# Patient Record
Sex: Female | Born: 1935 | Race: White | Hispanic: No | Marital: Married | Smoking: Never smoker
Health system: Southern US, Community
[De-identification: ages and names within clinical notes are randomized; demographics above are authoritative.]

## PROBLEM LIST (undated history)

## (undated) DIAGNOSIS — M1990 Unspecified osteoarthritis, unspecified site: Secondary | ICD-10-CM

## (undated) DIAGNOSIS — M810 Age-related osteoporosis without current pathological fracture: Secondary | ICD-10-CM

## (undated) DIAGNOSIS — I10 Essential (primary) hypertension: Secondary | ICD-10-CM

## (undated) DIAGNOSIS — K219 Gastro-esophageal reflux disease without esophagitis: Secondary | ICD-10-CM

## (undated) DIAGNOSIS — E7800 Pure hypercholesterolemia, unspecified: Secondary | ICD-10-CM

## (undated) DIAGNOSIS — F0390 Unspecified dementia without behavioral disturbance: Secondary | ICD-10-CM

## (undated) DIAGNOSIS — H269 Unspecified cataract: Secondary | ICD-10-CM

## (undated) HISTORY — PX: TONSILLECTOMY: SUR1361

## (undated) HISTORY — PX: APPENDECTOMY: SHX54

## (undated) HISTORY — PX: EYE SURGERY: SHX253

## (undated) HISTORY — DX: Unspecified cataract: H26.9

## (undated) HISTORY — DX: Age-related osteoporosis without current pathological fracture: M81.0

## (undated) HISTORY — PX: JOINT REPLACEMENT: SHX530

## (undated) HISTORY — PX: COLONOSCOPY: SHX174

## (undated) HISTORY — PX: ABDOMINAL HYSTERECTOMY: SHX81

## (undated) HISTORY — PX: BACK SURGERY: SHX140

---

## 1999-11-12 ENCOUNTER — Encounter: Admission: RE | Admit: 1999-11-12 | Discharge: 1999-11-12 | Payer: Self-pay | Admitting: Obstetrics and Gynecology

## 1999-11-12 ENCOUNTER — Encounter: Payer: Self-pay | Admitting: Obstetrics and Gynecology

## 1999-12-20 ENCOUNTER — Other Ambulatory Visit: Admission: RE | Admit: 1999-12-20 | Discharge: 1999-12-20 | Payer: Self-pay | Admitting: Obstetrics and Gynecology

## 2000-11-17 ENCOUNTER — Encounter: Admission: RE | Admit: 2000-11-17 | Discharge: 2000-11-17 | Payer: Self-pay | Admitting: Obstetrics and Gynecology

## 2000-11-17 ENCOUNTER — Encounter: Payer: Self-pay | Admitting: Obstetrics and Gynecology

## 2001-01-12 ENCOUNTER — Other Ambulatory Visit: Admission: RE | Admit: 2001-01-12 | Discharge: 2001-01-12 | Payer: Self-pay | Admitting: Obstetrics and Gynecology

## 2001-06-02 ENCOUNTER — Encounter: Payer: Self-pay | Admitting: Obstetrics and Gynecology

## 2001-06-02 ENCOUNTER — Encounter: Admission: RE | Admit: 2001-06-02 | Discharge: 2001-06-02 | Payer: Self-pay | Admitting: Obstetrics and Gynecology

## 2001-11-19 ENCOUNTER — Encounter: Payer: Self-pay | Admitting: Family Medicine

## 2001-11-19 ENCOUNTER — Encounter: Admission: RE | Admit: 2001-11-19 | Discharge: 2001-11-19 | Payer: Self-pay | Admitting: Family Medicine

## 2002-07-06 ENCOUNTER — Inpatient Hospital Stay (HOSPITAL_COMMUNITY): Admission: EM | Admit: 2002-07-06 | Discharge: 2002-07-14 | Payer: Self-pay | Admitting: Emergency Medicine

## 2002-07-06 ENCOUNTER — Encounter: Payer: Self-pay | Admitting: Emergency Medicine

## 2002-07-06 ENCOUNTER — Encounter: Payer: Self-pay | Admitting: *Deleted

## 2002-08-02 ENCOUNTER — Encounter: Admission: RE | Admit: 2002-08-02 | Discharge: 2002-09-16 | Payer: Self-pay | Admitting: Orthopaedic Surgery

## 2002-10-07 ENCOUNTER — Ambulatory Visit (HOSPITAL_COMMUNITY): Admission: RE | Admit: 2002-10-07 | Discharge: 2002-10-07 | Payer: Self-pay | Admitting: Orthopaedic Surgery

## 2002-11-04 ENCOUNTER — Ambulatory Visit (HOSPITAL_COMMUNITY): Admission: RE | Admit: 2002-11-04 | Discharge: 2002-11-04 | Payer: Self-pay | Admitting: Orthopaedic Surgery

## 2002-11-21 ENCOUNTER — Encounter: Payer: Self-pay | Admitting: Family Medicine

## 2002-11-21 ENCOUNTER — Encounter: Admission: RE | Admit: 2002-11-21 | Discharge: 2002-11-21 | Payer: Self-pay | Admitting: Family Medicine

## 2003-01-23 ENCOUNTER — Inpatient Hospital Stay (HOSPITAL_COMMUNITY): Admission: RE | Admit: 2003-01-23 | Discharge: 2003-01-26 | Payer: Self-pay | Admitting: Orthopaedic Surgery

## 2003-11-23 ENCOUNTER — Encounter: Admission: RE | Admit: 2003-11-23 | Discharge: 2003-11-23 | Payer: Self-pay | Admitting: Family Medicine

## 2004-12-03 ENCOUNTER — Encounter: Admission: RE | Admit: 2004-12-03 | Discharge: 2004-12-03 | Payer: Self-pay | Admitting: Family Medicine

## 2005-05-07 ENCOUNTER — Ambulatory Visit (HOSPITAL_COMMUNITY): Admission: RE | Admit: 2005-05-07 | Discharge: 2005-05-07 | Payer: Self-pay | Admitting: Family Medicine

## 2005-05-09 ENCOUNTER — Ambulatory Visit (HOSPITAL_COMMUNITY): Admission: RE | Admit: 2005-05-09 | Discharge: 2005-05-09 | Payer: Self-pay | Admitting: Orthopedic Surgery

## 2005-07-08 ENCOUNTER — Encounter: Admission: RE | Admit: 2005-07-08 | Discharge: 2005-07-08 | Payer: Self-pay | Admitting: Orthopedic Surgery

## 2005-07-28 ENCOUNTER — Inpatient Hospital Stay (HOSPITAL_COMMUNITY): Admission: RE | Admit: 2005-07-28 | Discharge: 2005-07-29 | Payer: Self-pay | Admitting: Orthopaedic Surgery

## 2005-12-25 ENCOUNTER — Encounter: Admission: RE | Admit: 2005-12-25 | Discharge: 2005-12-25 | Payer: Self-pay | Admitting: Family Medicine

## 2006-04-01 ENCOUNTER — Encounter: Admission: RE | Admit: 2006-04-01 | Discharge: 2006-04-01 | Payer: Self-pay | Admitting: Orthopaedic Surgery

## 2006-12-31 ENCOUNTER — Encounter: Admission: RE | Admit: 2006-12-31 | Discharge: 2006-12-31 | Payer: Self-pay | Admitting: Family Medicine

## 2008-01-05 ENCOUNTER — Encounter: Admission: RE | Admit: 2008-01-05 | Discharge: 2008-01-05 | Payer: Self-pay | Admitting: Family Medicine

## 2008-12-14 ENCOUNTER — Ambulatory Visit: Payer: Self-pay | Admitting: Gastroenterology

## 2009-01-03 ENCOUNTER — Ambulatory Visit: Payer: Self-pay | Admitting: Gastroenterology

## 2009-01-03 ENCOUNTER — Encounter: Payer: Self-pay | Admitting: Gastroenterology

## 2009-01-04 ENCOUNTER — Encounter: Payer: Self-pay | Admitting: Gastroenterology

## 2009-01-08 ENCOUNTER — Ambulatory Visit (HOSPITAL_COMMUNITY): Admission: RE | Admit: 2009-01-08 | Discharge: 2009-01-08 | Payer: Self-pay | Admitting: Family Medicine

## 2010-01-15 ENCOUNTER — Encounter: Admission: RE | Admit: 2010-01-15 | Discharge: 2010-01-15 | Payer: Self-pay | Admitting: Family Medicine

## 2011-01-16 ENCOUNTER — Encounter
Admission: RE | Admit: 2011-01-16 | Discharge: 2011-01-16 | Payer: Self-pay | Source: Home / Self Care | Attending: Family Medicine | Admitting: Family Medicine

## 2011-05-09 NOTE — Op Note (Signed)
[facility]  Patient:    Melissa Hall, DRUMMONDS Visit Number: [id number] MRN: [id number]          Service Type: SUR Location: [id number] Attending Physician:  Jacki Cones Dictated by:   Veverly Fells Ophelia Charter, M.D. Proc. Date: 07/11/02 Admit Date:  07/06/2002 Discharge Date: 07/14/2002                             Operative Report  PREOPERATIVE DIAGNOSIS:  Left total knee arthroplasty dislocation.  POSTOPERATIVE DIAGNOSIS:  Left total knee arthroplasty dislocation.  OPERATION PERFORMED:  Left total knee arthroplasty revision.  SURGEON:  Mark C. Ophelia Charter, M.D.  ASSISTANT:  Zonia Kief, PA-C  ANESTHESIA:  General.  TOURNIQUET TIME:  19 minutes.  DESCRIPTION OF PROCEDURE:  After induction of general anesthesia, orotracheal intubation with preoperative Ancef prophylaxis, the patient had already had a Foley catheter previously placed on admission.  Her history is that she had total knee arthroplasty done by Dr. Ollen Bowl in 1997.  This was a meniscal bearing Depuy prosthesis.  She had a standard plus tibia, 10 mm bearing, standard patella and a standard femur.  At some point a year ago she injured her knee with laxity of the posterior cruciate ligament.  Since that time she has had some symptoms getting up and recently when she tried to get up, her knee dislocated with dislocation of the femoral component off of the bearings with retained bearings filling the tract.  After impervious stockinette, sterile skin marker and Betadine Vi-Drape applications on the skin, the leg was wrapped with an Esmarch prior to tourniquet inflation.  The old scar was opened, subcutaneous tissue was sharply dissected.  The scar tissue was released off the medial retinaculum and the old nonabsorbable sutures were visualized.  Scalpel was used to perform the knee arthrotomy in the old incision and there was a moderate amount of serosanguineous fluid.  There was some evidence  of polyethylene wear debris in the suprapatellar pouch and partial synovectomy was performed.  With the knee flexed and extended, the patient under anesthesia self-reduced and she had reduction of the femoral condyles directly over the meniscal bearings. Both meniscal bearings were in place.  They were removed and inspected.  There was mild wear.  Minimal wear was visualized on the patella.  Initially 12 mm standard meniscal bearings were inserted.  There was still some laxity.  She easily hyperextended and then 15 mm bearings were tried.  This allowed full extension.  She was stable in both flexion and extension and there was only slight posterior laxity with the _____ bearings.  With the patella reduced, she still would flex to 120 degrees.  The patellar component was popped off and a new polyethylene patellar component was snapped on.  The cemented metal backing to the patella was secure and intact.  After irrigation with saline, permanent 15 mm bearings were exchanged one at a time.  Repeat irrigation was performed.  Tourniquet was deflated.  Small bleeders controlled with Bovie electrocautery.  0 Ethibond and #1 Ethibond were used for closure of the true retinaculum and split in the quad tendon. Superficial retinaculum closed with 2-0 Vicryl suture and two on the subcutaneous tissue, Marcaine infiltration of the skin.  Skin staple closure, postoperative dressing and knee immobilizer.  Instrument count and needle count were correct. Dictated by:   Veverly Fells Ophelia Charter, M.D. Attending Physician:  Jacki Cones DD:  07/11/02  TD:  07/14/02 Job: [id number]

## 2011-05-09 NOTE — Discharge Summary (Signed)
NAME:  Melissa Hall, MIX                         ACCOUNT NO.:  [id number]   MEDICAL RECORD NO.:  [id number]                   PATIENT TYPE:  INP   LOCATION:  5013                                 FACILITY:  [facility]   PHYSICIAN:  Mark C. Ophelia Charter, M.D.                 DATE OF BIRTH:  12-27-1927   DATE OF ADMISSION:  07/06/2002  DATE OF DISCHARGE:  07/14/2002                                 DISCHARGE SUMMARY   FINAL DIAGNOSES:  1. Status post left total knee revision.  2. Long-term use of anticoagulants.   HISTORY OF PRESENT ILLNESS:  77 year old white female who is status post  left total knee arthroplasty by Dr. Colon Flattery. Harkins in 1998, presents to the  [facility] Emergency Room with left knee pain with dislocation.  She states  that she was putting lotion on the bottom of her foot when she felt a pop in  the left knee.  She was unable to ambulate after this incident.  X-rays  taken showed a dislocated left total knee arthroplasty.  While in the  emergency room Dr. Ophelia Charter injected 20 cc of Marcaine into the knee.  The knee  was reduced and put in a knee immobilizer.  The patient did get relief after  this was done.  She was neurovascularly intact.  She was transferred to the  orthopaedic floor in stable condition.   ADMISSION LABS:  WBC 7.4, hemoglobin 13.6, hematocrit 40.5, platelets 201,  PT 12.7, INR 0.9, PTT 28, NA 141, K 3.8, CL 104, CO2 30, glucose 108, BUN 6,  creatinine 0.6, calcium 9.4, TP 6.7, albumin 3.8, AST 27, ALT 16, ALP 57, t-  bili 0.7.  EKG showed a normal sinus rhythm.   HOSPITAL COURSE:  On July 07, 2002 the patient had good pain control.  Vital  signs stable and afebrile.  Permit signed for left total knee revision for  dislocation.  PT evaluated patient.  On July 08, 2002 the patient's  procedure on hold secondary to awaiting for components for her revision  surgery.  On July 09, 2002 vital signs stable and afebrile.  Labs stable.  On July 10, 2002 vital signs  stable and afebrile.  On July 11, 2002 the  patient was taken to the operating room at [facility] for a left total knee  revision.  Surgeon Loraine Leriche C. Ophelia Charter, M.D. and assistant Genene Churn. Denton Meek.  Anesthesia was general with local Marcaine postop.  EBL less than 200 cc.  There were no surgical complications and the patient was transferred back to  her room in stable condition.  On July 12, 2002 the patient doing extremely  well with no specific complaints.  Vital signs stable.  Temperature 100.5.  PT 13.7, INR 1.0, hemoglobin 12.1.  CPM increased at 0 to 45 degrees.  FeSO4  325 mg P.O. b.i.d. started.  On July 13, 2002 patient doing  well, CPM 0 to  50 degrees.  Vital signs stable and afebrile.  PT 14.3, INR 1.1, hemoglobin  11.3.  Foley was DC'd and  an in-and-out cath ordered.  Morphine PCA pump  DC'd.  IV Hep-Lock __________.  Right arm Hep-Lock DC'd.  On July 14, 2002  the patient doing extremely well.  She was ready for discharge home.  PT  15.4, INR 1.2, hemoglobin 11.3.   DISPOSITION ON DISCHARGE:  Home.   CONDITION:  Stable and improved.   DISCHARGE MEDS:  1. Tylox 1-2 tabs P.O. q.4-6h. p.r.n. for pain.  2. Coumadin 5 mg P.O. q.d. increase by 1.5 mg until PT and INR are     therapeutic.  3. FeSO4 325 mg P.O. bid with food.  4. Colace 100 mg P.O. bid.   INSTRUCTIONS:  The patient will work with home health physical therapy five  times a week to increase ambulation and quad strengthening.  CPM for home.  Home health RN to monitor PT and INRs with dressing changes p.r.n.  She will  remain on Coumadin therapy for DVT prophylaxis times three to four weeks  after her surgical date.  She will follow up in the office July 26, 2002.  Staples will be removed two weeks postop.  If there are any problems or  complications she will notify us at our office.     Genene Churn. Denton Meek.                      Mark C. Ophelia Charter, M.D.    JMO/MEDQ  D:  08/20/2002  T:  08/22/2002  Job:  [id number]

## 2011-05-09 NOTE — Discharge Summary (Signed)
NAME:  Melissa Hall, Melissa Hall                         ACCOUNT NO.:  [id number]   MEDICAL RECORD NO.:  [id number]                   PATIENT TYPE:  INP   LOCATION:  5004                                 FACILITY:  [facility]   PHYSICIAN:  Mark C. Ophelia Charter, M.D.                 DATE OF BIRTH:  01-28-1928   DATE OF ADMISSION:  01/23/2003  DATE OF DISCHARGE:  01/26/2003                                 DISCHARGE SUMMARY   FINAL DIAGNOSES:  1. Status post right total knee arthroplasty.  2. Long term use of anticoagulants.   HISTORY OF PRESENT ILLNESS:  This 77 year old white female with a long  history of right knee osteoarthritis and chronic pain presented for a  preoperative evaluation for a right total knee arthroplasty.  Preoperative x-  rays showed bone-on-bone changes done in the office prior to admission and  subcondylar sclerosis.   PREADMISSION LABORATORY DATA:  WBC 5.1, RBC 4.55, hemoglobin 13.9,  hematocrit 41.2, platelets 183.  PT 12.4. INR 0.9, PTT 28.  Sodium 140, K  4.3, Cl 105, C02 28, glucose 136, BUN 5, creatinine 0.6, albumin 3.6, AST  24, ALT 16, ALP 62, total bilirubin 0.7.  Urinalysis negative.   HOSPITAL COURSE:  On January 23, 2003, the patient was taken to the [facility] Operating Room and a right total knee arthroplasty  procedure was performed.  The surgeon was [employer]. Ophelia Charter, M.D., and the  assistance was Genene Churn. Denton Meek.  Anesthesia was general.  EBL less than  200 mL.  There were no surgical or anesthesia complications.  The patient  was transferred to recovery and the orthopedic floor in stable condition.  On January 24, 2003, the INR was 1.0 and hemoglobin 10.8.  CPM increased 0-  40 degrees.  The patient was started on pharmacy protocol Coumadin for DVT  prophylaxis.  On January 25, 2003, CPM 0-50 degrees.  Vital signs stable  with temperature 100.3 degrees.  PT 15.7, INR 1.3.  Hemoglobin 10.7.  Morphine PCA discontinued.  IV hep locked.   Evaluated by OT.  On January 26, 2003, the patient was doing well and ambulated down the halls.  Stairs  completed.  She stated that she was ready to go home.  Vital signs stable  and afebrile.  Staples intact.  PT 16.3, INR 1.3.  Hemoglobin 11.0.  The  patient was ready for discharge home.   DISCHARGE MEDICATIONS:  1. Vicodin one to two tablets p.o. q.4-6h. p.r.n.  2. Coumadin 5 mg p.o. daily.  3. FESO4 325 mg p.o. b.i.d. with meals.  4. Colace 100 mg p.o. b.i.d.   CONDITION ON DISCHARGE:  Good and stable.   DISCHARGE INSTRUCTIONS:  The patient will work with home health PT five  times a weeks for the next four to five weeks to improve range of motion,  strengthening, and ambulation.  CPM  ordered for home.  She will have PT and  INR monitored by home health R.N.  She will remain on Coumadin for three to  four weeks postoperatively for DVT prophylaxis.  She will follow up in the  office in one week for recheck and staples will be removed two weeks  postoperatively.    Genene Churn. Denton Meek.                      Mark C. Ophelia Charter, M.D.   JMO/MEDQ  D:  02/28/2003  T:  02/28/2003  Job:  [id number]

## 2011-12-31 ENCOUNTER — Other Ambulatory Visit: Payer: Self-pay | Admitting: Family Medicine

## 2011-12-31 DIAGNOSIS — Z1231 Encounter for screening mammogram for malignant neoplasm of breast: Secondary | ICD-10-CM

## 2012-01-19 ENCOUNTER — Ambulatory Visit
Admission: RE | Admit: 2012-01-19 | Discharge: 2012-01-19 | Disposition: A | Discharge status: Home / Self Care | Payer: Medicare Other | Source: Ambulatory Visit | Attending: Family Medicine | Admitting: Family Medicine

## 2012-01-19 DIAGNOSIS — Z1231 Encounter for screening mammogram for malignant neoplasm of breast: Secondary | ICD-10-CM

## 2012-10-08 ENCOUNTER — Encounter (HOSPITAL_COMMUNITY): Payer: Self-pay

## 2012-10-13 ENCOUNTER — Encounter (HOSPITAL_COMMUNITY): Payer: Self-pay

## 2012-10-13 ENCOUNTER — Encounter (HOSPITAL_COMMUNITY)
Admission: RE | Admit: 2012-10-13 | Discharge: 2012-10-13 | Disposition: A | Discharge status: Home / Self Care | Payer: Medicare Other | Source: Ambulatory Visit | Attending: Ophthalmology | Admitting: Ophthalmology

## 2012-10-13 ENCOUNTER — Other Ambulatory Visit: Payer: Self-pay

## 2012-10-13 HISTORY — DX: Essential (primary) hypertension: I10

## 2012-10-13 HISTORY — DX: Pure hypercholesterolemia, unspecified: E78.00

## 2012-10-13 HISTORY — DX: Unspecified osteoarthritis, unspecified site: M19.90

## 2012-10-13 LAB — HEMOGLOBIN AND HEMATOCRIT, BLOOD: HCT: 38.9 % (ref 36.0–46.0)

## 2012-10-13 LAB — BASIC METABOLIC PANEL
BUN: 9 mg/dL (ref 6–23)
CO2: 31 mEq/L (ref 19–32)
Creatinine, Ser: 0.5 mg/dL (ref 0.50–1.10)
GFR calc Af Amer: >90 mL/min (ref >90)
Glucose, Bld: 228 mg/dL — ABNORMAL HIGH (ref 70–99)
Potassium: 3.9 mEq/L (ref 3.5–5.1)

## 2012-10-13 NOTE — Patient Instructions (Signed)
20 ALMENDRA LORIA  10/13/2012   Your procedure is scheduled on: 10/18/12  Report to [facility] at 1000 AM.  Call this number if you have problems the morning of surgery: [phone]   Remember:   Do not eat food:After Midnight.  May have clear liquids:until Midnight .  Clear liquids include soda, tea, black coffee, apple or grape juice, broth.  Take these medicines the morning of surgery with A SIP OF WATER: none   Do not wear jewelry, make-up or nail polish.  Do not wear lotions, powders, or perfumes. You may wear deodorant.  Do not shave 48 hours prior to surgery. Men may shave face and neck.  Do not bring valuables to the hospital.  Contacts, dentures or bridgework may not be worn into surgery.  Leave suitcase in the car. After surgery it may be brought to your room.  For patients admitted to the hospital, checkout time is 11:00 AM the day of discharge.   Patients discharged the day of surgery will not be allowed to drive home.  Name and phone number of your driver: family  Special Instructions: N/A   Please read over the following fact sheets that you were given: Anesthesia Post-op Instructions and Care and Recovery After Surgery   PATIENT INSTRUCTIONS POST-ANESTHESIA  IMMEDIATELY FOLLOWING SURGERY:  Do not drive or operate machinery for the first twenty four hours after surgery.  Do not make any important decisions for twenty four hours after surgery or while taking narcotic pain medications or sedatives.  If you develop intractable nausea and vomiting or a severe headache please notify your doctor immediately.  FOLLOW-UP:  Please make an appointment with your surgeon as instructed. You do not need to follow up with anesthesia unless specifically instructed to do so.  WOUND CARE INSTRUCTIONS (if applicable):  Keep a dry clean dressing on the anesthesia/puncture wound site if there is drainage.  Once the wound has quit draining you may leave it open to air.  Generally you should  leave the bandage intact for twenty four hours unless there is drainage.  If the epidural site drains for more than 36-48 hours please call the anesthesia department.  QUESTIONS?:  Please feel free to call your physician or the hospital operator if you have any questions, and they will be happy to assist you.      Cataract Surgery  A cataract is a clouding of the lens of the eye. When a lens becomes cloudy, vision is reduced based on the degree and nature of the clouding. Surgery may be needed to improve vision. Surgery removes the cloudy lens and usually replaces it with a substitute lens (intraocular lens, IOL). LET YOUR EYE DOCTOR KNOW ABOUT:  Allergies to food or medicine.  Medicines taken including herbs, eyedrops, over-the-counter medicines, and creams.  Use of steroids (by mouth or creams).  Previous problems with anesthetics or numbing medicine.  History of bleeding problems or blood clots.  Previous surgery.  Other health problems, including diabetes and kidney problems.  Possibility of pregnancy, if this applies. RISKS AND COMPLICATIONS  Infection.  Inflammation of the eyeball (endophthalmitis) that can spread to both eyes (sympathetic ophthalmia).  Poor wound healing.  If an IOL is inserted, it can later fall out of proper position. This is very uncommon.  Clouding of the part of your eye that holds an IOL in place. This is called an "after-cataract." These are uncommon, but easily treated. BEFORE THE PROCEDURE  Do not eat or drink anything  except small amounts of water for 8 to 12 before your surgery, or as directed by your caregiver.  Unless you are told otherwise, continue any eyedrops you have been prescribed.  Talk to your primary caregiver about all other medicines that you take (both prescription and non-prescription). In some cases, you may need to stop or change medicines near the time of your surgery. This is most important if you are taking  blood-thinning medicine.Do not stop medicines unless you are told to do so.  Arrange for someone to drive you to and from the procedure.  Do not put contact lenses in either eye on the day of your surgery. PROCEDURE There is more than one method for safely removing a cataract. Your doctor can explain the differences and help determine which is best for you. Phacoemulsification surgery is the most common form of cataract surgery.  An injection is given behind the eye or eyedrops are given to make this a painless procedure.  A small cut (incision) is made on the edge of the clear, dome-shaped surface that covers the front of the eye (cornea).  A tiny probe is painlessly inserted into the eye. This device gives off ultrasound waves that soften and break up the cloudy center of the lens. This makes it easier for the cloudy lens to be removed by suction.  An IOL may be implanted.  The normal lens of the eye is covered by a clear capsule. Part of that capsule is intentionally left in the eye to support the IOL.  Your surgeon may or may not use stitches to close the incision. There are other forms of cataract surgery that require a larger incision and stiches to close the eye. This approach is taken in cases where the doctor feels that the cataract cannot be easily removed using phacoemulsification. AFTER THE PROCEDURE  When an IOL is implanted, it does not need care. It becomes a permanent part of your eye and cannot be seen or felt.  Your doctor will schedule follow-up exams to check on your progress.  Review your other medicines with your doctor to see which can be resumed after surgery.  Use eyedrops or take medicine as prescribed by your doctor. Document Released: 11/27/2011 Document Revised: 03/01/2012 Document Reviewed: 11/27/2011 [facility] Patient Information 2013 [place].

## 2012-10-15 MED ORDER — TETRACAINE HCL 0.5 % OP SOLN
OPHTHALMIC | Status: AC
  Filled 2012-10-15: qty 2

## 2012-10-15 MED ORDER — NEOMYCIN-POLYMYXIN-DEXAMETH 3.5-10000-0.1 OP OINT
TOPICAL_OINTMENT | OPHTHALMIC | Status: AC
  Filled 2012-10-15: qty 3.5

## 2012-10-15 MED ORDER — LIDOCAINE HCL 3.5 % OP GEL
OPHTHALMIC | Status: AC
  Filled 2012-10-15: qty 5

## 2012-10-15 MED ORDER — CYCLOPENTOLATE-PHENYLEPHRINE 0.2-1 % OP SOLN
OPHTHALMIC | Status: AC
  Filled 2012-10-15: qty 2

## 2012-10-15 MED ORDER — LIDOCAINE HCL (PF) 1 % IJ SOLN
INTRAMUSCULAR | Status: AC
  Filled 2012-10-15: qty 2

## 2012-10-18 ENCOUNTER — Encounter (HOSPITAL_COMMUNITY): Payer: Self-pay | Admitting: Anesthesiology

## 2012-10-18 ENCOUNTER — Encounter (HOSPITAL_COMMUNITY): Payer: Self-pay

## 2012-10-18 ENCOUNTER — Ambulatory Visit (HOSPITAL_COMMUNITY)
Admission: RE | Admit: 2012-10-18 | Discharge: 2012-10-18 | Disposition: A | Discharge status: Home / Self Care | Payer: Medicare Other | Source: Ambulatory Visit | Attending: Ophthalmology | Admitting: Ophthalmology

## 2012-10-18 ENCOUNTER — Ambulatory Visit (HOSPITAL_COMMUNITY): Payer: Medicare Other | Admitting: Anesthesiology

## 2012-10-18 ENCOUNTER — Encounter (HOSPITAL_COMMUNITY)
Admission: RE | Disposition: A | Discharge status: Home / Self Care | Payer: Self-pay | Source: Ambulatory Visit | Attending: Ophthalmology

## 2012-10-18 DIAGNOSIS — Z01812 Encounter for preprocedural laboratory examination: Secondary | ICD-10-CM | POA: Insufficient documentation

## 2012-10-18 DIAGNOSIS — I10 Essential (primary) hypertension: Secondary | ICD-10-CM | POA: Insufficient documentation

## 2012-10-18 DIAGNOSIS — H2510 Age-related nuclear cataract, unspecified eye: Secondary | ICD-10-CM | POA: Insufficient documentation

## 2012-10-18 HISTORY — PX: CATARACT EXTRACTION W/PHACO: SHX586

## 2012-10-18 SURGERY — PHACOEMULSIFICATION, CATARACT, WITH IOL INSERTION
Anesthesia: Monitor Anesthesia Care | Site: Eye | Laterality: Left | Wound class: Clean

## 2012-10-18 MED ORDER — NEOMYCIN-POLYMYXIN-DEXAMETH 0.1 % OP OINT
TOPICAL_OINTMENT | OPHTHALMIC | Status: DC | PRN
  Administered 2012-10-18: 1 via OPHTHALMIC

## 2012-10-18 MED ORDER — MIDAZOLAM HCL 2 MG/2ML IJ SOLN
INTRAMUSCULAR | Status: AC
  Filled 2012-10-18: qty 2

## 2012-10-18 MED ORDER — POVIDONE-IODINE 5 % OP SOLN
OPHTHALMIC | Status: DC | PRN
  Administered 2012-10-18: 1 via OPHTHALMIC

## 2012-10-18 MED ORDER — FENTANYL CITRATE 0.05 MG/ML IJ SOLN: 25.0000 ug | INTRAMUSCULAR | Status: DC | PRN

## 2012-10-18 MED ORDER — LIDOCAINE HCL (PF) 1 % IJ SOLN
INTRAMUSCULAR | Status: DC | PRN
  Administered 2012-10-18: .4 mL

## 2012-10-18 MED ORDER — PROVISC 10 MG/ML IO SOLN
INTRAOCULAR | Status: DC | PRN
  Administered 2012-10-18: 8.5 mg via INTRAOCULAR

## 2012-10-18 MED ORDER — PHENYLEPHRINE HCL 2.5 % OP SOLN
1.0000 [drp] | OPHTHALMIC | Status: AC
  Administered 2012-10-18 (×3): 1 [drp] via OPHTHALMIC

## 2012-10-18 MED ORDER — BSS IO SOLN
INTRAOCULAR | Status: DC | PRN
  Administered 2012-10-18: 15 mL via INTRAOCULAR

## 2012-10-18 MED ORDER — MIDAZOLAM HCL 2 MG/2ML IJ SOLN
1.0000 mg | INTRAMUSCULAR | Status: DC | PRN
  Administered 2012-10-18: 1 mg via INTRAVENOUS

## 2012-10-18 MED ORDER — CYCLOPENTOLATE-PHENYLEPHRINE 0.2-1 % OP SOLN
1.0000 [drp] | OPHTHALMIC | Status: AC
Start: 2012-10-18 — End: 2012-10-18
  Administered 2012-10-18 (×3): 1 [drp] via OPHTHALMIC

## 2012-10-18 MED ORDER — EPINEPHRINE HCL 1 MG/ML IJ SOLN
INTRAOCULAR | Status: DC | PRN
  Administered 2012-10-18: 08:00:00

## 2012-10-18 MED ORDER — LACTATED RINGERS IV SOLN
INTRAVENOUS | Status: DC
  Administered 2012-10-18: 07:00:00 via INTRAVENOUS

## 2012-10-18 MED ORDER — ONDANSETRON HCL 4 MG/2ML IJ SOLN: 4.0000 mg | Freq: Once | INTRAMUSCULAR | Status: DC | PRN

## 2012-10-18 MED ORDER — LIDOCAINE HCL 3.5 % OP GEL: 1.0000 "application " | Freq: Once | OPHTHALMIC | Status: DC

## 2012-10-18 MED ORDER — TETRACAINE HCL 0.5 % OP SOLN
1.0000 [drp] | OPHTHALMIC | Status: AC
  Administered 2012-10-18 (×3): 1 [drp] via OPHTHALMIC

## 2012-10-18 MED ORDER — CYCLOPENTOLATE HCL 1 % OP SOLN: 1.0000 [drp] | OPHTHALMIC | Status: AC

## 2012-10-18 SURGICAL SUPPLY — 31 items
CAPSULAR TENSION RING-AMO (OPHTHALMIC RELATED) IMPLANT
CLOTH BEACON ORANGE TIMEOUT ST (SAFETY) ×2 IMPLANT
EYE SHIELD UNIVERSAL CLEAR (GAUZE/BANDAGES/DRESSINGS) ×2 IMPLANT
GLOVE BIO SURGEON STRL SZ 6.5 (GLOVE) IMPLANT
GLOVE BIOGEL PI IND STRL 6.5 (GLOVE) ×1 IMPLANT
GLOVE BIOGEL PI IND STRL 7.0 (GLOVE) IMPLANT
GLOVE BIOGEL PI IND STRL 7.5 (GLOVE) IMPLANT
GLOVE BIOGEL PI INDICATOR 6.5 (GLOVE) ×1
GLOVE BIOGEL PI INDICATOR 7.0 (GLOVE)
GLOVE BIOGEL PI INDICATOR 7.5 (GLOVE)
GLOVE ECLIPSE 6.5 STRL STRAW (GLOVE) IMPLANT
GLOVE ECLIPSE 7.0 STRL STRAW (GLOVE) IMPLANT
GLOVE ECLIPSE 7.5 STRL STRAW (GLOVE) IMPLANT
GLOVE EXAM NITRILE LRG STRL (GLOVE) IMPLANT
GLOVE EXAM NITRILE MD LF STRL (GLOVE) ×2 IMPLANT
GLOVE SKINSENSE NS SZ6.5 (GLOVE)
GLOVE SKINSENSE NS SZ7.0 (GLOVE)
GLOVE SKINSENSE STRL SZ6.5 (GLOVE) IMPLANT
GLOVE SKINSENSE STRL SZ7.0 (GLOVE) IMPLANT
KIT VITRECTOMY (OPHTHALMIC RELATED) IMPLANT
PAD ARMBOARD 7.5X6 YLW CONV (MISCELLANEOUS) ×2 IMPLANT
PROC W NO LENS (INTRAOCULAR LENS)
PROC W SPEC LENS (INTRAOCULAR LENS)
PROCESS W NO LENS (INTRAOCULAR LENS) IMPLANT
PROCESS W SPEC LENS (INTRAOCULAR LENS) IMPLANT
RING MALYGIN (MISCELLANEOUS) IMPLANT
SIGHTPATH CAT PROC W REG LENS (Ophthalmic Related) ×2 IMPLANT
SYR TB 1ML LL NO SAFETY (SYRINGE) ×2 IMPLANT
TAPE CLOTH SOFT 2X10 (GAUZE/BANDAGES/DRESSINGS) ×2 IMPLANT
VISCOELASTIC ADDITIONAL (OPHTHALMIC RELATED) IMPLANT
WATER STERILE IRR 250ML POUR (IV SOLUTION) ×2 IMPLANT

## 2012-10-18 NOTE — Op Note (Signed)
NAME:  Melissa Hall, Melissa Hall               ACCOUNT NO.:  [id number]  MEDICAL RECORD NO.:  [id number]  LOCATION:  APPO                          FACILITY:  [facility]  PHYSICIAN:  Susanne Greenhouse, MD       DATE OF BIRTH:  July 30, 1928  DATE OF PROCEDURE:  10/18/2012 DATE OF DISCHARGE:  10/18/2012                              OPERATIVE REPORT   PREOPERATIVE DIAGNOSIS:  Nuclear cataract, left eye, diagnosis code 366.16.  POSTOPERATIVE DIAGNOSIS:  Nuclear cataract, left eye, diagnosis code 366.16.  SURGEON:  Susanne Greenhouse, MD  OPERATION PERFORMED:  Phacoemulsification with posterior chamber intraocular lens implantation, left eye.  ANESTHESIA:  Topical with IV sedation.  OPERATIVE SUMMARY:  In the preoperative area, dilating drops were placed into the left eye.  The patient was then brought into the operating room where she was placed under topical anesthesia and IV sedation.  The eye was then prepped and draped.  Beginning with a 75 blade, a paracentesis port was made at the surgeon's 2 o'clock position.  The anterior chamber was then filled with a 1% nonpreserved lidocaine solution with epinephrine.  This was followed by Viscoat to deepen the chamber.  A small fornix-based peritomy was performed superiorly.  Next, a single iris hook was placed through the limbus superiorly.  A 2.4-mm keratome blade was then used to make a clear corneal incision over the iris hook. A bent cystotome needle and Utrata forceps were used to create a continuous tear capsulotomy.  Hydrodissection was performed using balanced salt solution on a fine cannula.  The lens nucleus was then removed using phacoemulsification in a quadrant cracking technique.  The cortical material was then removed with irrigation and aspiration.  The capsular bag and anterior chamber were refilled with Provisc.  The wound was widened to approximately 3 mm and a posterior chamber intraocular lens was placed into the capsular bag without difficulty  using an [employer] lens injecting system.  A single 10-0 nylon suture was then used to close the incision as well as stromal hydration.  The Provisc was removed from the anterior chamber and capsular bag with irrigation and aspiration.  At this point, the wounds were tested for leak, which were negative.  The anterior chamber remained deep and stable.  The patient tolerated the procedure well.  There were no operative complications, and she awoke from topical anesthesia and IV sedation without problem.  No surgical specimens.  Prosthetic device used is a Actuary enVista posterior chamber lens, model MX60, power of 22.0, serial number is [id number].          ______________________________ Susanne Greenhouse, MD     KEH/MEDQ  D:  10/18/2012  T:  10/18/2012  Job:  [id number]

## 2012-10-18 NOTE — H&P (Signed)
I have reviewed the H&P, the patient was re-examined, and I have identified no interval changes in medical condition and plan of care since the history and physical of record  

## 2012-10-18 NOTE — Anesthesia Preprocedure Evaluation (Signed)
Anesthesia Evaluation  Patient identified by MRN, date of birth, ID band Patient awake    Reviewed: Allergy & Precautions, H&P , NPO status   Airway Mallampati: II      Dental  (+) Teeth Intact   Pulmonary neg pulmonary ROS,  breath sounds clear to auscultation        Cardiovascular hypertension, Pt. on medications Rhythm:Regular Rate:Normal     Neuro/Psych    GI/Hepatic negative GI ROS,   Endo/Other    Renal/GU      Musculoskeletal   Abdominal   Peds  Hematology   Anesthesia Other Findings   Reproductive/Obstetrics                           Anesthesia Physical Anesthesia Plan  ASA: II  Anesthesia Plan: MAC   Post-op Pain Management:    Induction: Intravenous  Airway Management Planned: Nasal Cannula  Additional Equipment:   Intra-op Plan:   Post-operative Plan:   Informed Consent: I have reviewed the patients History and Physical, chart, labs and discussed the procedure including the risks, benefits and alternatives for the proposed anesthesia with the patient or authorized representative who has indicated his/her understanding and acceptance.     Plan Discussed with:   Anesthesia Plan Comments:         Anesthesia Quick Evaluation  

## 2012-10-18 NOTE — Brief Op Note (Signed)
Pre-Op Dx: Cataract OS Post-Op Dx: Cataract OS Surgeon: Jashon Ishida Anesthesia: Topical with MAC Surgery: Cataract Extraction with Intraocular lens Implant OS Implant: B&L enVista Specimen: None Complications: None 

## 2012-10-18 NOTE — Anesthesia Postprocedure Evaluation (Signed)
  Anesthesia Post-op Note  Patient: Melissa Hall  Procedure(s) Performed: Procedure(s) (LRB) with comments: CATARACT EXTRACTION PHACO AND INTRAOCULAR LENS PLACEMENT (IOC) (Left) - CDE:17.69  Patient Location: PACU and Short Stay  Anesthesia Type: MAC   Level of Consciousness: awake, alert , oriented and patient cooperative  Airway and Oxygen Therapy: Patient Spontanous Breathing  Post-op Pain: none  Post-op Assessment: Post-op Vital signs reviewed, Patient's Cardiovascular Status Stable, Respiratory Function Stable, Patent Airway and Pain level controlled  Post-op Vital Signs: Reviewed and stable  Complications: No apparent anesthesia complications

## 2012-10-18 NOTE — Transfer of Care (Signed)
Immediate Anesthesia Transfer of Care Note  Patient: Melissa Hall  Procedure(s) Performed: Procedure(s) (LRB) with comments: CATARACT EXTRACTION PHACO AND INTRAOCULAR LENS PLACEMENT (IOC) (Left) - CDE:17.69  Patient Location: PACU and Short Stay  Anesthesia Type:MAC  Level of Consciousness: awake, alert  and patient cooperative  Airway & Oxygen Therapy: Patient Spontanous Breathing  Post-op Assessment: Report given to PACU RN, Post -op Vital signs reviewed and stable and Patient moving all extremities  Post vital signs: Reviewed and stable  Complications: No apparent anesthesia complications

## 2012-10-20 ENCOUNTER — Encounter (HOSPITAL_COMMUNITY): Payer: Self-pay | Admitting: Ophthalmology

## 2012-10-21 ENCOUNTER — Other Ambulatory Visit (HOSPITAL_COMMUNITY): Payer: Medicare Other

## 2013-02-15 ENCOUNTER — Other Ambulatory Visit: Payer: Self-pay | Admitting: Family Medicine

## 2013-02-15 DIAGNOSIS — Z1231 Encounter for screening mammogram for malignant neoplasm of breast: Secondary | ICD-10-CM

## 2013-03-15 ENCOUNTER — Ambulatory Visit: Payer: Medicare Other

## 2013-03-18 ENCOUNTER — Ambulatory Visit: Payer: Medicare Other

## 2013-03-29 ENCOUNTER — Telehealth: Payer: Self-pay | Admitting: Family Medicine

## 2013-03-29 DIAGNOSIS — H532 Diplopia: Secondary | ICD-10-CM

## 2013-03-29 NOTE — Telephone Encounter (Signed)
Ask patient why Dr Daryl Eastern wanted her to see a neurologist. Let me know . Then we will make the referral.

## 2013-03-29 NOTE — Telephone Encounter (Signed)
Ask patient why Dr.Eppes wants her to see neurologist. Let me know.we can then make the referral.

## 2013-03-29 NOTE — Telephone Encounter (Signed)
Dr Christell Constant pls review message

## 2013-03-30 NOTE — Telephone Encounter (Signed)
Pt is having double vision

## 2013-04-04 ENCOUNTER — Ambulatory Visit
Admission: RE | Admit: 2013-04-04 | Discharge: 2013-04-04 | Disposition: A | Discharge status: Home / Self Care | Payer: Medicare Other | Source: Ambulatory Visit | Attending: Family Medicine | Admitting: Family Medicine

## 2013-04-04 DIAGNOSIS — Z1231 Encounter for screening mammogram for malignant neoplasm of breast: Secondary | ICD-10-CM

## 2013-04-15 ENCOUNTER — Ambulatory Visit (INDEPENDENT_AMBULATORY_CARE_PROVIDER_SITE_OTHER): Payer: Medicare Other | Admitting: Diagnostic Neuroimaging

## 2013-04-15 ENCOUNTER — Encounter: Payer: Self-pay | Admitting: Diagnostic Neuroimaging

## 2013-04-15 VITALS — BP 123/77 | HR 73 | Temp 98.6°F | Ht 60.0 in | Wt 137.0 lb

## 2013-04-15 DIAGNOSIS — H532 Diplopia: Secondary | ICD-10-CM

## 2013-04-15 DIAGNOSIS — Z79899 Other long term (current) drug therapy: Secondary | ICD-10-CM

## 2013-04-15 DIAGNOSIS — R6889 Other general symptoms and signs: Secondary | ICD-10-CM
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
## 2013-04-15 NOTE — Patient Instructions (Addendum)
I will order MRI and labs. Continue to use your glasses.

## 2013-04-16 LAB — VITAMIN B12: Vitamin B-12: 878 pg/mL (ref 211–946)

## 2013-04-25 ENCOUNTER — Telehealth: Payer: Self-pay | Admitting: *Deleted

## 2013-04-25 NOTE — Progress Notes (Signed)
Quick Note:  Left a message on the pt's home voice mail requesting a call-back regarding her recent labs. Contact information was given so that she may call with any questions or concerns.   ______

## 2013-04-25 NOTE — Telephone Encounter (Signed)
Left a message on the pt's home voice mail requesting a call back regarding her recent labs.  Contact information was also given.

## 2013-04-25 NOTE — Telephone Encounter (Signed)
Message copied by Avie Echevaria on Mon Apr 25, 2013  9:07 AM ------      Message from: Joycelyn Schmid      Created: Fri Apr 22, 2013  5:36 PM       pls call pt with normal labs. -VRP            ----- Message -----         From: [facility] Lab Results In Interface         Sent: 04/16/2013   5:48 AM           To: Suanne Marker, MD                   ------

## 2013-04-27 ENCOUNTER — Ambulatory Visit
Admission: RE | Admit: 2013-04-27 | Discharge: 2013-04-27 | Disposition: A | Discharge status: Home / Self Care | Payer: Medicare Other | Source: Ambulatory Visit | Attending: Diagnostic Neuroimaging | Admitting: Diagnostic Neuroimaging

## 2013-04-27 DIAGNOSIS — R6889 Other general symptoms and signs: Secondary | ICD-10-CM

## 2013-04-27 DIAGNOSIS — H532 Diplopia: Secondary | ICD-10-CM

## 2013-04-27 DIAGNOSIS — Z79899 Other long term (current) drug therapy: Secondary | ICD-10-CM

## 2013-04-27 MED ORDER — GADOBENATE DIMEGLUMINE 529 MG/ML IV SOLN
10.0000 mL | Freq: Once | INTRAVENOUS | Status: AC | PRN
  Administered 2013-04-27: 10 mL via INTRAVENOUS

## 2013-05-04 ENCOUNTER — Telehealth: Payer: Self-pay | Admitting: Family Medicine

## 2013-05-04 NOTE — Telephone Encounter (Signed)
MRI was ordered to evaluate patient's diplopia per her ophthalmologist's request.   She has not been officially notified of the results and would like to discuss them and any recommendations for follow-up.

## 2013-05-04 NOTE — Telephone Encounter (Signed)
Please followup on this information for her, the appointment and the results of the MRI. Did we order the MRI? The status of both answers to these questions

## 2013-05-05 NOTE — Telephone Encounter (Signed)
Referral completed pt had appt with [facility] 04/15/13

## 2013-05-06 ENCOUNTER — Telehealth: Payer: Self-pay | Admitting: *Deleted

## 2013-05-09 NOTE — Telephone Encounter (Signed)
Pt called requesting results. Returned call, no answer. Showing CMA-Steve called previously with lab results, no answer. Will fwd back to him for contact.

## 2013-05-12 ENCOUNTER — Telehealth: Payer: Self-pay | Admitting: *Deleted

## 2013-05-12 NOTE — Telephone Encounter (Signed)
Please advise 

## 2013-05-13 NOTE — Telephone Encounter (Signed)
i called pt and discussed the impression of the mri's with her- i advised her to call her neurologist whom ordered this and dr epps her eye dr. To confirm the details of the mri.

## 2013-05-25 ENCOUNTER — Encounter: Payer: Self-pay | Admitting: Family Medicine

## 2013-05-25 ENCOUNTER — Ambulatory Visit (INDEPENDENT_AMBULATORY_CARE_PROVIDER_SITE_OTHER): Payer: Medicare Other | Admitting: Family Medicine

## 2013-05-25 VITALS — BP 132/82 | HR 67 | Temp 97.5°F | Ht 58.5 in | Wt 136.0 lb

## 2013-05-25 DIAGNOSIS — M810 Age-related osteoporosis without current pathological fracture: Secondary | ICD-10-CM

## 2013-05-25 DIAGNOSIS — R5383 Other fatigue: Secondary | ICD-10-CM

## 2013-05-25 DIAGNOSIS — E559 Vitamin D deficiency, unspecified: Secondary | ICD-10-CM

## 2013-05-25 DIAGNOSIS — H532 Diplopia: Secondary | ICD-10-CM

## 2013-05-25 DIAGNOSIS — E785 Hyperlipidemia, unspecified: Secondary | ICD-10-CM

## 2013-05-25 DIAGNOSIS — R5381 Other malaise: Secondary | ICD-10-CM

## 2013-05-25 DIAGNOSIS — I10 Essential (primary) hypertension: Secondary | ICD-10-CM

## 2013-05-25 DIAGNOSIS — M4010 Other secondary kyphosis, site unspecified: Secondary | ICD-10-CM

## 2013-05-25 LAB — HEPATIC FUNCTION PANEL
AST: 25 U/L (ref 0–37)
Albumin: 4.2 g/dL (ref 3.5–5.2)
Total Bilirubin: 0.7 mg/dL (ref 0.3–1.2)

## 2013-05-25 LAB — POCT CBC
Granulocyte percent: 73.4 %G (ref 37–80)
Hemoglobin: 13.7 g/dL (ref 12.2–16.2)
Lymph, poc: 1.3 (ref 0.6–3.4)
MPV: 9 fL (ref 0–99.8)
POC LYMPH PERCENT: 24.1 %L (ref 10–50)
Platelet Count, POC: 198 10*3/uL (ref 142–424)
RBC: 4.4 M/uL (ref 4.04–5.48)

## 2013-05-25 LAB — BASIC METABOLIC PANEL WITH GFR
Chloride: 103 mEq/L (ref 96–112)
GFR, Est Non African American: 89 mL/min
Potassium: 4.5 mEq/L (ref 3.5–5.3)

## 2013-05-25 NOTE — Patient Instructions (Addendum)
Fall precautions discussed Continue current meds and therapeutic lifestyle changes We will call her when lab results are available.  We will arrange for you to have a pelvic exam. We will also arrange for you to have a DEXA scan to check your bone density. We will try to get a copy of your mammogram report.

## 2013-05-25 NOTE — Progress Notes (Signed)
  Subjective:    Patient ID: Melissa Hall, female    DOB: 10-01-1928, 79 y.o.   MRN: [id number]  HPI Patient returns to clinic today for routine followup of chronic medical conditions. She has had double vision recently. After a visit to the neurologist and an MRI it was determined that some small strokes could have played a role with causing this double vision. Her vision has been corrected slowly by her ophthalmologist with corrective lenses.   Review of Systems  Constitutional: Positive for fatigue (slight).  HENT: Negative.   Eyes: Positive for visual disturbance (double vision, seeing Dr Jettie Pagan).  Respiratory: Negative.   Cardiovascular: Positive for leg swelling (slight ankles).  Gastrointestinal: Negative.   Endocrine: Positive for cold intolerance.  Genitourinary: Positive for frequency (due to meds). Negative for dysuria.  Musculoskeletal: Positive for back pain (all over, occasionally) and arthralgias (knees, hands).  Skin: Negative.   Allergic/Immunologic: Negative.   Neurological: Negative.   Hematological: Bruises/bleeds easily.  Psychiatric/Behavioral: Negative.        Objective:   Physical Exam BP 132/82  Pulse 67  Temp(Src) 97.5 F (36.4 C) (Oral)  Ht 4' 10.5" (1.486 m)  Wt 136 lb (61.689 kg)  BMI 27.94 kg/m2  The patient appeared well nourished , somewhat kyphotic development , alert and oriented to time and place. Speech, behavior and judgement appear normal. Vital signs as documented.  Head exam is unremarkable. No scleral icterus or pallor noted. Mouth throat and ears within normal limit  Neck is without jugular venous distension, thyromegally, or carotid bruits. Carotid upstrokes are brisk bilaterally. No cervical adenopathy. Lungs are clear anteriorly and posteriorly to auscultation. Normal respiratory effort. Cardiac exam reveals regular rate and rhythm at 84 per minute. First and second heart sounds normal.  No murmurs, rubs or gallops.  Abdominal  exam reveals  no masses, no organomegaly and no aortic enlargement. No inguinal adenopathy. There was no tenderness to palpation Extremities are nonedematous and both femoral and pedal pulses are normal. Skin without pallor or jaundice.  Warm and dry, without rash. Neurologic exam reveals normal deep tendon reflexes and normal sensation.          Assessment & Plan:  1. Vitamin D deficiency - Vitamin D 25 hydroxy; Standing  2. Hyperlipemia - NMR Lipoprofile with Lipids; Standing - Hepatic function panel; Standing  3. Hypertension - POCT CBC; Standing - BASIC METABOLIC PANEL WITH GFR; Standing  4. Kyphosis due to osteoporosis  5. Diplopia This will continue to be followed by ophthalmologist  6. Fatigue  Patient Instructions  Fall precautions discussed Continue current meds and therapeutic lifestyle changes We will call her when lab results are available.  We will arrange for you to have a pelvic exam. We will also arrange for you to have a DEXA scan to check your bone density. We will try to get a copy of your mammogram report.

## 2013-05-25 NOTE — Addendum Note (Signed)
Addended by: Orma Render F on: 05/25/2013 01:54 PM   Modules accepted: Orders

## 2013-05-26 LAB — NMR LIPOPROFILE WITH LIPIDS
HDL Size: 9.4 nm (ref >=9.2)
HDL-C: 56 mg/dL (ref >=40)
LDL (calc): 84 mg/dL (ref <100)
LDL Particle Number: 1110 nmol/L — ABNORMAL HIGH (ref <1000)
LP-IR Score: <25 (ref <=45)
Triglycerides: 69 mg/dL (ref <150)
VLDL Size: 37.7 nm (ref <=46.6)

## 2013-05-26 LAB — VITAMIN D 25 HYDROXY (VIT D DEFICIENCY, FRACTURES): Vit D, 25-Hydroxy: 39 ng/mL (ref 30–89)

## 2013-06-01 ENCOUNTER — Other Ambulatory Visit (INDEPENDENT_AMBULATORY_CARE_PROVIDER_SITE_OTHER): Payer: Medicare Other

## 2013-06-01 DIAGNOSIS — Z1212 Encounter for screening for malignant neoplasm of rectum: Secondary | ICD-10-CM

## 2013-06-01 NOTE — Progress Notes (Unsigned)
Patient dropped off fobt 

## 2013-06-02 LAB — FECAL OCCULT BLOOD, IMMUNOCHEMICAL: Fecal Occult Blood: NEGATIVE

## 2013-06-09 ENCOUNTER — Telehealth: Payer: Self-pay | Admitting: *Deleted

## 2013-06-13 NOTE — Telephone Encounter (Signed)
Assist done

## 2013-06-13 NOTE — Telephone Encounter (Signed)
Assist reviewed

## 2013-08-09 ENCOUNTER — Other Ambulatory Visit: Payer: Self-pay

## 2013-08-09 MED ORDER — HYDROCHLOROTHIAZIDE 12.5 MG PO CAPS: 12.5000 mg | ORAL_CAPSULE | Freq: Every day | ORAL | Status: DC

## 2013-11-09 ENCOUNTER — Ambulatory Visit: Payer: Medicare Other | Admitting: Diagnostic Neuroimaging

## 2013-11-24 ENCOUNTER — Ambulatory Visit: Payer: Medicare Other | Admitting: Family Medicine

## 2013-11-24 ENCOUNTER — Other Ambulatory Visit: Payer: Self-pay | Admitting: *Deleted

## 2013-11-24 MED ORDER — PRAVASTATIN SODIUM 10 MG PO TABS: 10.0000 mg | ORAL_TABLET | Freq: Every day | ORAL | Status: DC

## 2013-11-29 ENCOUNTER — Ambulatory Visit: Payer: Medicare Other | Admitting: Family Medicine

## 2013-12-01 ENCOUNTER — Ambulatory Visit: Payer: Medicare Other | Admitting: Family Medicine

## 2013-12-09 ENCOUNTER — Other Ambulatory Visit: Payer: Self-pay | Admitting: Family Medicine

## 2013-12-14 ENCOUNTER — Encounter: Payer: Self-pay | Admitting: Family Medicine

## 2013-12-14 ENCOUNTER — Ambulatory Visit (INDEPENDENT_AMBULATORY_CARE_PROVIDER_SITE_OTHER): Payer: Medicare Other | Admitting: Family Medicine

## 2013-12-14 VITALS — BP 132/83 | HR 72 | Temp 97.0°F | Ht 58.5 in | Wt 135.0 lb

## 2013-12-14 DIAGNOSIS — I10 Essential (primary) hypertension: Secondary | ICD-10-CM | POA: Insufficient documentation

## 2013-12-14 DIAGNOSIS — M899 Disorder of bone, unspecified: Secondary | ICD-10-CM

## 2013-12-14 DIAGNOSIS — M4120 Other idiopathic scoliosis, site unspecified: Secondary | ICD-10-CM | POA: Insufficient documentation

## 2013-12-14 DIAGNOSIS — E559 Vitamin D deficiency, unspecified: Secondary | ICD-10-CM

## 2013-12-14 DIAGNOSIS — M8580 Other specified disorders of bone density and structure, unspecified site: Secondary | ICD-10-CM

## 2013-12-14 DIAGNOSIS — E785 Hyperlipidemia, unspecified: Secondary | ICD-10-CM

## 2013-12-14 DIAGNOSIS — Z23 Encounter for immunization: Secondary | ICD-10-CM

## 2013-12-14 NOTE — Addendum Note (Signed)
Addended by: Orma Render F on: 12/14/2013 10:38 AM   Modules accepted: Orders

## 2013-12-14 NOTE — Addendum Note (Signed)
Addended by: Magdalene River on: 12/14/2013 09:19 AM   Modules accepted: Orders

## 2013-12-14 NOTE — Progress Notes (Signed)
Subjective:    Patient ID: Melissa Hall, female    DOB: March 27, 1928, 79 y.o.   MRN: [id number]  HPI Pt here for follow up and management of chronic medical problems. Patient comes in today for followup and management of hypertension, hyperlipidemia and vitamin D deficiency. She complains today of some generalized arthralgias. She is up-to-date on her health maintenance. She will be due a DEXA scan and a mammogram. DEXA scan is due now the mammogram will be due in February. Shoulder lab work today and she is also in need of a Prevnar vaccine. The patient is pleasant and cooperative and has a kyphotic structure. She ambulates using a cane and is slow in her movements.     There are no active problems to display for this patient.  Outpatient Encounter Prescriptions as of 12/14/2013  Medication Sig  . Alfalfa TABS Take 2 tablets by mouth daily.  Marland Kitchen aspirin EC 81 MG tablet Take 81 mg by mouth daily.  . cholecalciferol (VITAMIN D) 1000 UNITS tablet Take 1,000 Units by mouth daily. Taking 2000 units daily  . hydrochlorothiazide (MICROZIDE) 12.5 MG capsule TAKE (1) CAPSULE DAILY  . Multiple Vitamins-Minerals (CENTRUM SILVER PO) Take 1 tablet by mouth daily.  . Omega-3 Fatty Acids (FISH OIL PO) Take 3,000 mg by mouth daily.  . pravastatin (PRAVACHOL) 10 MG tablet Take 1 tablet (10 mg total) by mouth daily.    Review of Systems  Constitutional: Negative.   HENT: Negative.   Eyes: Negative.   Respiratory: Negative.   Cardiovascular: Negative.   Gastrointestinal: Negative.   Endocrine: Negative.   Genitourinary: Negative.   Musculoskeletal: Positive for arthralgias (bilateral knee pain ).  Skin: Negative.   Allergic/Immunologic: Negative.   Neurological: Negative.   Hematological: Negative.   Psychiatric/Behavioral: Negative.        Objective:   Physical Exam  Nursing note and vitals reviewed. Constitutional: She is oriented to person, place, and time. She appears well-developed  and well-nourished.  HENT:  Head: Normocephalic and atraumatic.  Right Ear: External ear normal.  Left Ear: External ear normal.  Nose: Nose normal.  Mouth/Throat: Oropharynx is clear and moist.  Eyes: Conjunctivae and EOM are normal. Pupils are equal, round, and reactive to light. Right eye exhibits no discharge. Left eye exhibits no discharge. No scleral icterus.  Neck: Normal range of motion. Neck supple. No JVD present. No thyromegaly present.  No carotid bruits heard  Cardiovascular: Normal rate, regular rhythm, normal heart sounds and intact distal pulses.  Exam reveals no gallop and no friction rub.   No murmur heard. At 72 per minute  Pulmonary/Chest: Effort normal and breath sounds normal. No respiratory distress. She has no wheezes. She has no rales. She exhibits no tenderness.  No axillary nodes palpable  Abdominal: Soft. Bowel sounds are normal. She exhibits no mass. There is no tenderness. There is no rebound and no guarding.  No inguinal nodes palpable  Musculoskeletal: Normal range of motion. She exhibits no edema and no tenderness.  Patient has kyphoscoliosis  Lymphadenopathy:    She has no cervical adenopathy.  Neurological: She is alert and oriented to person, place, and time. She has normal reflexes. No cranial nerve deficit.  Skin: Skin is warm and dry.  Psychiatric: She has a normal mood and affect. Her behavior is normal. Judgment and thought content normal.   BP 132/83  Pulse 72  Temp(Src) 97 F (36.1 C) (Oral)  Ht 4' 10.5" (1.486 m)  Wt 135 lb (61.236  kg)  BMI 27.73 kg/m2        Assessment & Plan:  1. HTN (hypertension) - POCT CBC - Hepatic function panel - BMP8+EGFR  2. Hyperlipidemia - POCT CBC - NMR, lipoprofile  3. Vitamin D deficiency - POCT CBC - Vit D  25 hydroxy (rtn osteoporosis monitoring)  4. Hypertension  5. Osteopenia -DEXA scan will be scheduled  6. Scoliosis (and kyphoscoliosis), idiopathic  No orders of the defined  types were placed in this encounter.   She will be given a Prevnar vaccine today. Continue current medication  Patient Instructions  Continue current medications. Continue good therapeutic lifestyle changes which include good diet and exercise. Fall precautions discussed with patient. Schedule your flu vaccine if you haven't had it yet If you are over 29 years old - you may need Prevnar 13 or the adult Pneumonia vaccine. The Prevnar vaccine may make your arm  somewhat sore For your arthralgias except strength Tylenol as always best. If you're a lot and occasional ibuprofen or Advil after meals would be okay but watch your stomach.   Nyra Capes MD

## 2013-12-14 NOTE — Patient Instructions (Addendum)
Continue current medications. Continue good therapeutic lifestyle changes which include good diet and exercise. Fall precautions discussed with patient. Schedule your flu vaccine if you haven't had it yet If you are over 77 years old - you may need Prevnar 13 or the adult Pneumonia vaccine. The Prevnar vaccine may make your arm  somewhat sore For your arthralgias except strength Tylenol as always best. If you're a lot and occasional ibuprofen or Advil after meals would be okay but watch your stomach.

## 2013-12-15 LAB — CBC WITH DIFFERENTIAL
Basophils Absolute: 0 10*3/uL (ref 0.0–0.2)
Eosinophils Absolute: 0 10*3/uL (ref 0.0–0.4)
Immature Grans (Abs): 0 10*3/uL (ref 0.0–0.1)
Immature Granulocytes: 0 %
Lymphs: 23 %
MCH: 31.4 pg (ref 26.6–33.0)
MCHC: 34.4 g/dL (ref 31.5–35.7)
Monocytes Absolute: 0.4 10*3/uL (ref 0.1–0.9)
Neutrophils Relative %: 67 %
Platelets: 189 10*3/uL (ref 150–379)
RDW: 13.2 % (ref 12.3–15.4)

## 2013-12-17 LAB — BMP8+EGFR
BUN/Creatinine Ratio: 16 (ref 11–26)
BUN: 9 mg/dL (ref 8–27)
CO2: 28 mmol/L (ref 18–29)
Chloride: 103 mmol/L (ref 97–108)
GFR calc Af Amer: 100 mL/min/{1.73_m2} (ref >59)
Glucose: 94 mg/dL (ref 65–99)
Potassium: 4 mmol/L (ref 3.5–5.2)

## 2013-12-17 LAB — NMR, LIPOPROFILE
Cholesterol: 154 mg/dL (ref <200)
HDL Particle Number: 36.1 umol/L (ref >=30.5)
LDL Size: 20.6 nm (ref >20.5)
LDLC SERPL CALC-MCNC: 80 mg/dL (ref <100)
LP-IR Score: <25 (ref <=45)

## 2013-12-17 LAB — HEPATIC FUNCTION PANEL
AST: 25 IU/L (ref 0–40)
Alkaline Phosphatase: 68 IU/L (ref 39–117)
Bilirubin, Direct: 0.15 mg/dL (ref 0.00–0.40)
Total Protein: 6.1 g/dL (ref 6.0–8.5)

## 2013-12-17 LAB — VITAMIN D 25 HYDROXY (VIT D DEFICIENCY, FRACTURES): Vit D, 25-Hydroxy: 39.6 ng/mL (ref 30.0–100.0)

## 2014-01-11 ENCOUNTER — Other Ambulatory Visit: Payer: Self-pay | Admitting: Family Medicine

## 2014-01-18 ENCOUNTER — Other Ambulatory Visit: Payer: Self-pay | Admitting: Family Medicine

## 2014-01-25 ENCOUNTER — Encounter: Payer: Self-pay | Admitting: Pharmacist

## 2014-01-25 ENCOUNTER — Ambulatory Visit (INDEPENDENT_AMBULATORY_CARE_PROVIDER_SITE_OTHER): Payer: Medicare Other | Admitting: Pharmacist

## 2014-01-25 ENCOUNTER — Ambulatory Visit (INDEPENDENT_AMBULATORY_CARE_PROVIDER_SITE_OTHER): Payer: Medicare Other

## 2014-01-25 VITALS — Ht 61.0 in | Wt 130.0 lb

## 2014-01-25 DIAGNOSIS — M899 Disorder of bone, unspecified: Secondary | ICD-10-CM

## 2014-01-25 DIAGNOSIS — M4120 Other idiopathic scoliosis, site unspecified: Secondary | ICD-10-CM

## 2014-01-25 DIAGNOSIS — M8580 Other specified disorders of bone density and structure, unspecified site: Secondary | ICD-10-CM

## 2014-01-25 DIAGNOSIS — M810 Age-related osteoporosis without current pathological fracture: Secondary | ICD-10-CM | POA: Insufficient documentation

## 2014-01-25 DIAGNOSIS — E559 Vitamin D deficiency, unspecified: Secondary | ICD-10-CM

## 2014-01-25 DIAGNOSIS — M949 Disorder of cartilage, unspecified: Secondary | ICD-10-CM

## 2014-01-25 MED ORDER — ALENDRONATE SODIUM 70 MG PO TABS: 70.0000 mg | ORAL_TABLET | ORAL | Status: DC

## 2014-01-25 NOTE — Progress Notes (Signed)
Patient ID: Melissa Hall, female   DOB: 1928/02/03, 80 y.o.   MRN: [id number]  [facility] Current Height: Height: 5\' 1"  (154.9 cm)      Max Lifetime Height:  5' 3.5" Current Weight: Weight: 130 lb (58.968 kg)       Ethnicity:Caucasian   HPI: Patient with osteoporosis and difficulty tolerating bisphosphonates in the past.  She is currently not taking any medication for osteoporosis  Back Pain?  No       Kyphosis?  Yes - slight Prior fracture?  No Med(s) for Osteoporosis/Osteopenia:  None Med(s) previously tried for Osteoporosis/Osteopenia:  Actonel - sore throat;  Fosamax - esophagitis                                                             PMH: Age at menopause:  Late 50's Hysterectomy?  yes Oophorectomy?  Yes HRT? Yes - Former.  Type/duration: estorgen - 20 years Steroid Use?  No Thyroid med?  No History of cancer?  No History of digestive disorders (ie Crohn's)?  Yes Current or previous eating disorders?  No Last Vitamin D Result:  39.6 (12/14/2013) Last GFR Result:  87 (12/14/2013)   FH/SH: Family history of osteoporosis?  No - mother not diagnosed but she thinks she probably has osteoporosis Parent with history of hip fracture?  No Family history of breast cancer?  Yes - mother Exercise?  No Smoking?  No Alcohol?  No    Calcium Assessment Calcium Intake  # of servings/day  Calcium mg  Milk (8 oz) 1 - 2  x  300  = 300 - 600mg   Yogurt (4 oz) 0.5 x  200 = 100mg   Cheese (1 oz) 1 x  200 = 200mg   Other Calcium sources   250mg   Ca supplement MVI daily = 400mg    Estimated calcium intake per day 1250mg     DEXA Results Date of Test T-Score for [facility] Spine L1-L4 T-Score for Total Left Hip T-Score for Total Right Hip  01/25/2014 -1.2 -2.8 -2.8  02/16/2006 -2.1 -2.4 -2.1  10/02/2003 -2.0 -2.3 --  05/28/1999 -1.9 -2.2 --     Assessment: soteoporosis  Recommendations: 1.  Discussed multiple treatment options bisphosphonates, Forteo and Prolia.  After  discussion patient decided she wanted to retry alendronate.  Start  alendronate (FOSAMAX) 70mg  1 tablet weekly.   Reviewed proper admnistration - empty stomach, full glass of water and no food, drink, medications or lying down for 30 minutes after admin. 2.  continue calcium 1200mg  daily through supplementation or diet.  3.  recommend weight bearing exercise - 30 minutes at least 4 days  per week.   4.  Counseled and educated about fall risk and prevention.  Recheck DEXA:  2 years  Time spent counseling patient:  30 minutes  Henrene Pastorammy Martie Fulgham, PharmD, CPP

## 2014-01-25 NOTE — Patient Instructions (Signed)

## 2014-03-14 ENCOUNTER — Encounter: Payer: Self-pay | Admitting: *Deleted

## 2014-04-18 ENCOUNTER — Ambulatory Visit: Payer: Medicare Other | Admitting: Family Medicine

## 2014-04-24 ENCOUNTER — Ambulatory Visit (INDEPENDENT_AMBULATORY_CARE_PROVIDER_SITE_OTHER): Payer: Medicare Other | Admitting: Family Medicine

## 2014-04-24 ENCOUNTER — Encounter: Payer: Self-pay | Admitting: Family Medicine

## 2014-04-24 VITALS — BP 121/71 | HR 75 | Temp 98.8°F | Ht 61.0 in | Wt 138.0 lb

## 2014-04-24 DIAGNOSIS — I10 Essential (primary) hypertension: Secondary | ICD-10-CM

## 2014-04-24 DIAGNOSIS — M4120 Other idiopathic scoliosis, site unspecified: Secondary | ICD-10-CM

## 2014-04-24 DIAGNOSIS — E785 Hyperlipidemia, unspecified: Secondary | ICD-10-CM

## 2014-04-24 DIAGNOSIS — E559 Vitamin D deficiency, unspecified: Secondary | ICD-10-CM

## 2014-04-24 LAB — POCT CBC
GRANULOCYTE PERCENT: 59.6 % (ref 37–80)
HCT, POC: 38.7 % (ref 37.7–47.9)
Hemoglobin: 12.7 g/dL (ref 12.2–16.2)
LYMPH, POC: 1.7 (ref 0.6–3.4)
MCH, POC: 30.9 pg (ref 27–31.2)
MCHC: 32.9 g/dL (ref 31.8–35.4)
MCV: 94 fL (ref 80–97)
MPV: 8.3 fL (ref 0–99.8)
PLATELET COUNT, POC: 192 10*3/uL (ref 142–424)
POC Granulocyte: 3.1 (ref 2–6.9)
POC LYMPH %: 33.3 % (ref 10–50)
RBC: 4.1 M/uL (ref 4.04–5.48)
RDW, POC: 12.6 %
WBC: 5.2 10*3/uL (ref 4.6–10.2)

## 2014-04-24 NOTE — Patient Instructions (Addendum)
Medicare Annual Wellness Visit  [facility] and the medical providers at [facility] strive to bring you the best medical care.  In doing so we not only want to address your current medical conditions and concerns but also to detect new conditions early and prevent illness, disease and health-related problems.    Medicare offers a yearly Wellness Visit which allows our clinical staff to assess your need for preventative services including immunizations, lifestyle education, counseling to decrease risk of preventable diseases and screening for fall risk and other medical concerns.    This visit is provided free of charge (no copay) for all Medicare recipients. The clinical pharmacists at [facility] have begun to conduct these Wellness Visits which will also include a thorough review of all your medications.    As you primary medical provider recommend that you make an appointment for your Annual Wellness Visit if you have not done so already this year.  You may set up this appointment before you leave today or you may call back [phone]) and schedule an appointment.  Please make sure when you call that you mention that you are scheduling your Annual Wellness Visit with the clinical pharmacist so that the appointment may be made for the proper length of time.      Continue current medications. Continue good therapeutic lifestyle changes which include good diet and exercise. Fall precautions discussed with patient. If an FOBT was given today- please return it to our front desk. If you are over 452 years old - you may need Prevnar 13 or the adult Pneumonia vaccine.  Please please continue to be careful and did not put herself at risk for falls Keep your appointment and get your mammogram Call you with the results of her lab work once those results are available

## 2014-04-24 NOTE — Progress Notes (Signed)
Subjective:    Patient ID: Melissa Hall, female    DOB: 1928-12-20, 80 y.o.   MRN: [id number]  HPI Pt here for follow up and management of chronic medical problems. She is doing well with no particular complaints          Patient Active Problem List   Diagnosis Date Noted  . Osteoporosis, post-menopausal 01/25/2014  . Hyperlipidemia 12/14/2013  . Hypertension 12/14/2013  . Vitamin D deficiency 12/14/2013  . Scoliosis (and kyphoscoliosis), idiopathic 12/14/2013   Outpatient Encounter Prescriptions as of 04/24/2014  Medication Sig  . alendronate (FOSAMAX) 70 MG tablet Take 1 tablet (70 mg total) by mouth every 7 (seven) days. Take with a full glass of water on an empty stomach. Do not eat, take other medications, or lie down within 30 minutes of taking this medication.  . Alfalfa TABS Take 2 tablets by mouth daily.  Marland Kitchen aspirin EC 81 MG tablet Take 81 mg by mouth daily.  . cholecalciferol (VITAMIN D) 1000 UNITS tablet Take 3,000 Units by mouth daily.   . hydrochlorothiazide (MICROZIDE) 12.5 MG capsule TAKE (1) CAPSULE DAILY  . Multiple Vitamins-Minerals (CENTRUM SILVER PO) Take 1 tablet by mouth daily.  . Omega-3 Fatty Acids (FISH OIL PO) Take 3,000 mg by mouth daily.  . pravastatin (PRAVACHOL) 10 MG tablet TAKE 1 TABLET DAILY    Review of Systems  Constitutional: Negative.   HENT: Negative.   Eyes: Negative.   Respiratory: Negative.   Cardiovascular: Negative.   Gastrointestinal: Negative.   Endocrine: Negative.   Genitourinary: Negative.   Musculoskeletal: Negative.   Skin: Negative.   Allergic/Immunologic: Negative.   Neurological: Negative.   Hematological: Negative.   Psychiatric/Behavioral: Negative.        Objective:   Physical Exam  Nursing note and vitals reviewed. Constitutional: She is oriented to person, place, and time. She appears well-nourished. No distress.  Small and kyphotic but alert and pleasant  HENT:  Head: Normocephalic and atraumatic.    Right Ear: External ear normal.  Left Ear: External ear normal.  Nose: Nose normal.  Mouth/Throat: Oropharynx is clear and moist.  Eyes: Conjunctivae and EOM are normal. Pupils are equal, round, and reactive to light. Right eye exhibits no discharge. Left eye exhibits no discharge. No scleral icterus.  Neck: Normal range of motion. Neck supple. No JVD present. No thyromegaly present.   No carotid bruits  Cardiovascular: Normal rate, regular rhythm, normal heart sounds and intact distal pulses.  Exam reveals no gallop and no friction rub.   No murmur heard. At 72 per minute  Pulmonary/Chest: Effort normal and breath sounds normal. No respiratory distress. She has no wheezes. She has no rales. She exhibits no tenderness.  Abdominal: Soft. Bowel sounds are normal. She exhibits no mass. There is no tenderness. There is no rebound and no guarding.  Genitourinary:  Both breasts were checked. There were no lumps or masses. There was no axillary adenopathy.  Musculoskeletal: Normal range of motion. She exhibits no edema and no tenderness.  Slow in movement due to the kyphosis and arthralgias in both knees  Lymphadenopathy:    She has no cervical adenopathy.  Neurological: She is alert and oriented to person, place, and time. She has normal reflexes. No cranial nerve deficit.  Skin: Skin is warm and dry. No rash noted.  Psychiatric: She has a normal mood and affect. Her behavior is normal. Judgment and thought content normal.   BP 121/71  Pulse 75  Temp(Src) 98.8 F (  37.1 C) (Oral)  Ht _0  (1.549 m)  Wt 138 lb (62.596 kg)  BMI 26.09 kg/m2        Assessment & Plan:  1. Vitamin D deficiency - POCT CBC - Vit D  25 hydroxy (rtn osteoporosis monitoring)  2. Hypertension - POCT CBC - BMP8+EGFR - Hepatic function panel  3. Hyperlipidemia - POCT CBC - Lipid panel  4. Scoliosis (and kyphoscoliosis), idiopathic   Patient Instructions                       Medicare Annual Wellness  Visit  [facility] and the medical providers at [facility] strive to bring you the best medical care.  In doing so we not only want to address your current medical conditions and concerns but also to detect new conditions early and prevent illness, disease and health-related problems.    Medicare offers a yearly Wellness Visit which allows our clinical staff to assess your need for preventative services including immunizations, lifestyle education, counseling to decrease risk of preventable diseases and screening for fall risk and other medical concerns.    This visit is provided free of charge (no copay) for all Medicare recipients. The clinical pharmacists at [facility] have begun to conduct these Wellness Visits which will also include a thorough review of all your medications.    As you primary medical provider recommend that you make an appointment for your Annual Wellness Visit if you have not done so already this year.  You may set up this appointment before you leave today or you may call back ([phone]) and schedule an appointment.  Please make sure when you call that you mention that you are scheduling your Annual Wellness Visit with the clinical pharmacist so that the appointment may be made for the proper length of time.      Continue current medications. Continue good therapeutic lifestyle changes which include good diet and exercise. Fall precautions discussed with patient. If an FOBT was given today- please return it to our front desk. If you are over 65 years old - you may need Prevnar 62 or the adult Pneumonia vaccine.  Please please continue to be careful and did not put herself at risk for falls Keep your appointment and get your mammogram Call you with the results of her lab work once those results are available   Arrie Senate MD

## 2014-04-25 LAB — HEPATIC FUNCTION PANEL
ALBUMIN: 4.1 g/dL (ref 3.5–4.7)
ALT: 14 IU/L (ref 0–32)
AST: 29 IU/L (ref 0–40)
Alkaline Phosphatase: 72 IU/L (ref 39–117)
BILIRUBIN TOTAL: 0.5 mg/dL (ref 0.0–1.2)
Bilirubin, Direct: 0.14 mg/dL (ref 0.00–0.40)
Total Protein: 6.3 g/dL (ref 6.0–8.5)

## 2014-04-25 LAB — LIPID PANEL
CHOLESTEROL TOTAL: 148 mg/dL (ref 100–199)
Chol/HDL Ratio: 2.3 ratio units (ref 0.0–4.4)
HDL: 63 mg/dL (ref >39)
LDL Calculated: 72 mg/dL (ref 0–99)
Triglycerides: 63 mg/dL (ref 0–149)
VLDL CHOLESTEROL CAL: 13 mg/dL (ref 5–40)

## 2014-04-25 LAB — VITAMIN D 25 HYDROXY (VIT D DEFICIENCY, FRACTURES): Vit D, 25-Hydroxy: 39.2 ng/mL (ref 30.0–100.0)

## 2014-04-25 LAB — BMP8+EGFR
BUN/Creatinine Ratio: 14 (ref 11–26)
BUN: 9 mg/dL (ref 8–27)
CALCIUM: 9.6 mg/dL (ref 8.7–10.3)
CHLORIDE: 100 mmol/L (ref 97–108)
CO2: 28 mmol/L (ref 18–29)
CREATININE: 0.63 mg/dL (ref 0.57–1.00)
GFR calc Af Amer: 96 mL/min/{1.73_m2} (ref >59)
GFR calc non Af Amer: 83 mL/min/{1.73_m2} (ref >59)
Glucose: 79 mg/dL (ref 65–99)
Potassium: 3.4 mmol/L — ABNORMAL LOW (ref 3.5–5.2)
SODIUM: 145 mmol/L — AB (ref 134–144)

## 2014-05-01 ENCOUNTER — Other Ambulatory Visit (INDEPENDENT_AMBULATORY_CARE_PROVIDER_SITE_OTHER): Payer: Medicare Other

## 2014-05-01 DIAGNOSIS — R7989 Other specified abnormal findings of blood chemistry: Secondary | ICD-10-CM

## 2014-05-01 DIAGNOSIS — E785 Hyperlipidemia, unspecified: Secondary | ICD-10-CM

## 2014-05-01 DIAGNOSIS — I10 Essential (primary) hypertension: Secondary | ICD-10-CM

## 2014-05-01 DIAGNOSIS — E559 Vitamin D deficiency, unspecified: Secondary | ICD-10-CM

## 2014-05-01 NOTE — Addendum Note (Signed)
Addended by: Prescott GumLAND, Aundrey Elahi M on: 05/01/2014 09:00 AM   Modules accepted: Orders

## 2014-05-01 NOTE — Progress Notes (Signed)
Patient came in for labs only.

## 2014-05-02 LAB — POTASSIUM: Potassium: 3.6 mmol/L (ref 3.5–5.2)

## 2014-05-09 ENCOUNTER — Emergency Department (HOSPITAL_COMMUNITY)
Admission: EM | Admit: 2014-05-09 | Discharge: 2014-05-09 | Disposition: A | Discharge status: Home / Self Care | Payer: Medicare Other | Attending: Emergency Medicine | Admitting: Emergency Medicine

## 2014-05-09 ENCOUNTER — Emergency Department (HOSPITAL_COMMUNITY): Payer: Medicare Other

## 2014-05-09 ENCOUNTER — Encounter (HOSPITAL_COMMUNITY): Payer: Self-pay | Admitting: Emergency Medicine

## 2014-05-09 DIAGNOSIS — S42209A Unspecified fracture of upper end of unspecified humerus, initial encounter for closed fracture: Secondary | ICD-10-CM | POA: Insufficient documentation

## 2014-05-09 DIAGNOSIS — Z79899 Other long term (current) drug therapy: Secondary | ICD-10-CM | POA: Insufficient documentation

## 2014-05-09 DIAGNOSIS — Z8719 Personal history of other diseases of the digestive system: Secondary | ICD-10-CM | POA: Insufficient documentation

## 2014-05-09 DIAGNOSIS — M129 Arthropathy, unspecified: Secondary | ICD-10-CM | POA: Insufficient documentation

## 2014-05-09 DIAGNOSIS — I10 Essential (primary) hypertension: Secondary | ICD-10-CM | POA: Insufficient documentation

## 2014-05-09 DIAGNOSIS — S42302A Unspecified fracture of shaft of humerus, left arm, initial encounter for closed fracture: Secondary | ICD-10-CM

## 2014-05-09 DIAGNOSIS — Z7982 Long term (current) use of aspirin: Secondary | ICD-10-CM | POA: Insufficient documentation

## 2014-05-09 DIAGNOSIS — Y9389 Activity, other specified: Secondary | ICD-10-CM | POA: Insufficient documentation

## 2014-05-09 DIAGNOSIS — E7800 Pure hypercholesterolemia, unspecified: Secondary | ICD-10-CM | POA: Insufficient documentation

## 2014-05-09 DIAGNOSIS — Y92009 Unspecified place in unspecified non-institutional (private) residence as the place of occurrence of the external cause: Secondary | ICD-10-CM | POA: Insufficient documentation

## 2014-05-09 DIAGNOSIS — W19XXXA Unspecified fall, initial encounter: Secondary | ICD-10-CM | POA: Insufficient documentation

## 2014-05-09 MED ORDER — HYDROCODONE-ACETAMINOPHEN 5-325 MG PO TABS: 1.0000 | ORAL_TABLET | Freq: Four times a day (QID) | ORAL | Status: DC | PRN

## 2014-05-09 MED ORDER — FENTANYL CITRATE 0.05 MG/ML IJ SOLN
50.0000 ug | Freq: Once | INTRAMUSCULAR | Status: AC
  Administered 2014-05-09: 50 ug via INTRAVENOUS
  Filled 2014-05-09: qty 2

## 2014-05-09 NOTE — ED Notes (Addendum)
Patient is from home. 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 Patient has deformity to left shoulder with positive pulses. Patient denied pain on transport. Patient denies hitting her head and no LOC.  Patient takes Asprin 81 mg daily.

## 2014-05-09 NOTE — ED Provider Notes (Signed)
CSN: [id number]     Arrival date & time 05/09/14  1618 History   First MD Initiated Contact with Patient 05/09/14 1624     Chief Complaint  Patient presents with  . Fall  . Shoulder Injury     (Consider location/radiation/quality/duration/timing/severity/associated sxs/prior Treatment) Patient is a 80 y.o. female presenting with fall and shoulder injury. The history is provided by the patient.  Fall This is a new problem. The current episode started less than 1 hour ago. Episode frequency: once. The problem has not changed since onset.Pertinent negatives include no chest pain, no abdominal pain and no shortness of breath. Nothing aggravates the symptoms. Nothing relieves the symptoms. She has tried nothing for the symptoms.  Shoulder Injury This is a new problem. The current episode started less than 1 hour ago. The problem occurs constantly. The problem has not changed since onset.Pertinent negatives include no chest pain, no abdominal pain and no shortness of breath.    Past Medical History  Diagnosis Date  . Hypertension   . Arthritis   . High cholesterol    Past Surgical History  Procedure Laterality Date  . Joint replacement      bilat  . Abdominal hysterectomy    . Back surgery    . Cataract extraction w/phaco  10/18/2012    Procedure: CATARACT EXTRACTION PHACO AND INTRAOCULAR LENS PLACEMENT (IOC);  Surgeon: Gemma PayorKerry Hunt, MD;  Location: [facility];  Service: Ophthalmology;  Laterality: Left;  CDE:17.69   Family History  Problem Relation Age of Onset  . Heart disease Mother   . Heart disease Father    History  Substance Use Topics  . Smoking status: Never Smoker   . Smokeless tobacco: Not on file  . Alcohol Use: No   OB History   Grav Para Term Preterm Abortions TAB SAB Ect Mult Living                 Review of Systems  Constitutional: Negative for fever.  Respiratory: Negative for cough and shortness of breath.   Cardiovascular: Negative for chest pain and leg  swelling.  Gastrointestinal: Negative for vomiting and abdominal pain.  All other systems reviewed and are negative.     Allergies  Keflex  Home Medications   Prior to Admission medications   Medication Sig Start Date End Date Taking? Authorizing Provider  alendronate (FOSAMAX) 70 MG tablet Take 1 tablet (70 mg total) by mouth every 7 (seven) days. Take with a full glass of water on an empty stomach. Do not eat, take other medications, or lie down within 30 minutes of taking this medication. 01/25/14   Ernestina Pennaonald W Moore, MD  Alfalfa TABS Take 2 tablets by mouth daily.    Historical Provider, MD  aspirin EC 81 MG tablet Take 81 mg by mouth daily.    Historical Provider, MD  cholecalciferol (VITAMIN D) 1000 UNITS tablet Take 3,000 Units by mouth daily.     Historical Provider, MD  hydrochlorothiazide (MICROZIDE) 12.5 MG capsule TAKE (1) CAPSULE DAILY 01/11/14   Ernestina Pennaonald W Moore, MD  Multiple Vitamins-Minerals (CENTRUM SILVER PO) Take 1 tablet by mouth daily.    Historical Provider, MD  Omega-3 Fatty Acids (FISH OIL PO) Take 3,000 mg by mouth daily.    Historical Provider, MD  pravastatin (PRAVACHOL) 10 MG tablet TAKE 1 TABLET DAILY 01/18/14   Ernestina Pennaonald W Moore, MD   BP 136/69  Pulse 82  Temp(Src) 98.7 F (37.1 C) (Oral)  Resp 20  Ht 5' (1.524  m)  Wt 132 lb (59.875 kg)  BMI 25.78 kg/m2  SpO2 97% Physical Exam  Nursing note and vitals reviewed. Constitutional: She is oriented to person, place, and time. She appears well-developed and well-nourished. No distress.  HENT:  Head: Normocephalic and atraumatic.  Eyes: EOM are normal. Pupils are equal, round, and reactive to light.  Neck: Normal range of motion. Neck supple.  Cardiovascular: Normal rate and regular rhythm.  Exam reveals no friction rub.   No murmur heard. Pulmonary/Chest: Effort normal and breath sounds normal. No respiratory distress. She has no wheezes. She has no rales.  Abdominal: Soft. She exhibits no distension. There is no  tenderness. There is no rebound.  Musculoskeletal: She exhibits no edema.       Left shoulder: She exhibits decreased range of motion, tenderness, deformity and pain. She exhibits no effusion, no spasm, normal pulse and normal strength.  Neurological: She is alert and oriented to person, place, and time. She exhibits normal muscle tone.  Skin: Skin is warm. No rash noted. She is not diaphoretic.    ED Course  Procedures (including critical care time) Labs Review Labs Reviewed - No data to display  Imaging Review Dg Shoulder Left  05/09/2014   CLINICAL DATA:  Traumatic injury and pain  EXAM: LEFT SHOULDER - 2+ VIEW  COMPARISON:  None.  FINDINGS: There is a comminuted fracture involving the proximal left humerus predominately through the surgical neck. There is not appear to be dislocation of the humeral head from the glenoid fossa. Impaction is noted at the fracture site. No other fracture is noted.  IMPRESSION: Comminuted proximal left humeral fracture   Electronically Signed   By: Alcide CleverMark  Lukens M.D.   On: 05/09/2014 17:16     EKG Interpretation None      MDM   Final diagnoses:  Comminuted left humeral fracture    71F with L shoulder injury. 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 Lost her balance and caught herself with her hand on concrete floor. Did not hit her hand, no LOC. On aspirin, no other anti-coagulatants. On exam, L shoulder swelling anteriorly, lateral deltoid swelling. Not a deformity c/w classic anterior shoulder dislocation. Concern for accompanying fracture. Will xray to determine if able to reduce in the ED.  Good L radial pulse, L arm sensation. Decreased ROM. No apparent clavicular injury or AC separation.  Xray with comminuted humeral head fracture. She is located, no evidence of dislocation. Ortho recommended sling and f/u, sent home with pain meds. Is not her dominant arm, has support at home of son, husband.  Dagmar HaitWilliam Nolyn Eilert, MD 05/09/14 [phone]

## 2014-05-11 ENCOUNTER — Other Ambulatory Visit (HOSPITAL_COMMUNITY): Payer: Self-pay | Admitting: Orthopaedic Surgery

## 2014-05-11 ENCOUNTER — Encounter (HOSPITAL_COMMUNITY): Payer: Self-pay | Admitting: *Deleted

## 2014-05-12 ENCOUNTER — Inpatient Hospital Stay (HOSPITAL_COMMUNITY): Payer: Medicare Other

## 2014-05-12 ENCOUNTER — Encounter (HOSPITAL_COMMUNITY)
Admission: RE | Disposition: A | Discharge status: Home / Self Care | Payer: Self-pay | Source: Ambulatory Visit | Attending: Orthopaedic Surgery

## 2014-05-12 ENCOUNTER — Inpatient Hospital Stay (HOSPITAL_COMMUNITY): Payer: Medicare Other | Admitting: Anesthesiology

## 2014-05-12 ENCOUNTER — Observation Stay (HOSPITAL_COMMUNITY)
Admission: RE | Admit: 2014-05-12 | Discharge: 2014-05-13 | Disposition: A | Discharge status: Home / Self Care | Payer: Medicare Other | Source: Ambulatory Visit | Attending: Orthopaedic Surgery | Admitting: Orthopaedic Surgery

## 2014-05-12 ENCOUNTER — Encounter (HOSPITAL_COMMUNITY): Payer: Medicare Other | Admitting: Anesthesiology

## 2014-05-12 ENCOUNTER — Encounter (HOSPITAL_COMMUNITY): Payer: Self-pay | Admitting: *Deleted

## 2014-05-12 DIAGNOSIS — Z9660 Presence of unspecified orthopedic joint implant: Secondary | ICD-10-CM | POA: Insufficient documentation

## 2014-05-12 DIAGNOSIS — M129 Arthropathy, unspecified: Secondary | ICD-10-CM | POA: Insufficient documentation

## 2014-05-12 DIAGNOSIS — Z7982 Long term (current) use of aspirin: Secondary | ICD-10-CM | POA: Insufficient documentation

## 2014-05-12 DIAGNOSIS — E7800 Pure hypercholesterolemia, unspecified: Secondary | ICD-10-CM | POA: Insufficient documentation

## 2014-05-12 DIAGNOSIS — S42202A Unspecified fracture of upper end of left humerus, initial encounter for closed fracture: Secondary | ICD-10-CM | POA: Diagnosis present

## 2014-05-12 DIAGNOSIS — W19XXXA Unspecified fall, initial encounter: Secondary | ICD-10-CM | POA: Insufficient documentation

## 2014-05-12 DIAGNOSIS — I10 Essential (primary) hypertension: Secondary | ICD-10-CM | POA: Insufficient documentation

## 2014-05-12 DIAGNOSIS — S42209A Unspecified fracture of upper end of unspecified humerus, initial encounter for closed fracture: Secondary | ICD-10-CM | POA: Diagnosis present

## 2014-05-12 DIAGNOSIS — S42213A Unspecified displaced fracture of surgical neck of unspecified humerus, initial encounter for closed fracture: Principal | ICD-10-CM | POA: Insufficient documentation

## 2014-05-12 DIAGNOSIS — K219 Gastro-esophageal reflux disease without esophagitis: Secondary | ICD-10-CM | POA: Insufficient documentation

## 2014-05-12 HISTORY — DX: Gastro-esophageal reflux disease without esophagitis: K21.9

## 2014-05-12 HISTORY — PX: ORIF HUMERUS FRACTURE: SHX2126

## 2014-05-12 LAB — CBC
HEMATOCRIT: 35.5 % — AB (ref 36.0–46.0)
Hemoglobin: 11.8 g/dL — ABNORMAL LOW (ref 12.0–15.0)
MCH: 31.8 pg (ref 26.0–34.0)
MCHC: 33.2 g/dL (ref 30.0–36.0)
MCV: 95.7 fL (ref 78.0–100.0)
Platelets: 168 10*3/uL (ref 150–400)
RBC: 3.71 MIL/uL — ABNORMAL LOW (ref 3.87–5.11)
RDW: 12.9 % (ref 11.5–15.5)
WBC: 6.6 10*3/uL (ref 4.0–10.5)

## 2014-05-12 LAB — BASIC METABOLIC PANEL
BUN: 8 mg/dL (ref 6–23)
CHLORIDE: 101 meq/L (ref 96–112)
CO2: 30 mEq/L (ref 19–32)
CREATININE: 0.47 mg/dL — AB (ref 0.50–1.10)
Calcium: 9.3 mg/dL (ref 8.4–10.5)
GFR calc Af Amer: >90 mL/min (ref >90)
GFR calc non Af Amer: 89 mL/min — ABNORMAL LOW (ref >90)
GLUCOSE: 104 mg/dL — AB (ref 70–99)
Potassium: 3 mEq/L — ABNORMAL LOW (ref 3.7–5.3)
Sodium: 143 mEq/L (ref 137–147)

## 2014-05-12 SURGERY — OPEN REDUCTION INTERNAL FIXATION (ORIF) PROXIMAL HUMERUS FRACTURE
Anesthesia: Regional | Site: Shoulder | Laterality: Left

## 2014-05-12 MED ORDER — LACTATED RINGERS IV SOLN
INTRAVENOUS | Status: DC | PRN
  Administered 2014-05-12 (×2): via INTRAVENOUS

## 2014-05-12 MED ORDER — PHENOL 1.4 % MT LIQD: 1.0000 | OROMUCOSAL | Status: DC | PRN

## 2014-05-12 MED ORDER — BISACODYL 10 MG RE SUPP: 10.0000 mg | Freq: Every day | RECTAL | Status: DC | PRN

## 2014-05-12 MED ORDER — LIDOCAINE HCL (CARDIAC) 20 MG/ML IV SOLN
INTRAVENOUS | Status: DC | PRN
  Administered 2014-05-12: 40 mg via INTRAVENOUS

## 2014-05-12 MED ORDER — NEOSTIGMINE METHYLSULFATE 10 MG/10ML IV SOLN
INTRAVENOUS | Status: DC | PRN
  Administered 2014-05-12: 4 mg via INTRAVENOUS

## 2014-05-12 MED ORDER — METHOCARBAMOL 500 MG PO TABS: 500.0000 mg | ORAL_TABLET | Freq: Four times a day (QID) | ORAL | Status: DC | PRN

## 2014-05-12 MED ORDER — FLEET ENEMA 7-19 GM/118ML RE ENEM: 1.0000 | ENEMA | Freq: Once | RECTAL | Status: AC | PRN

## 2014-05-12 MED ORDER — ONDANSETRON HCL 4 MG/2ML IJ SOLN
INTRAMUSCULAR | Status: DC | PRN
  Administered 2014-05-12: 4 mg via INTRAVENOUS

## 2014-05-12 MED ORDER — FENTANYL CITRATE 0.05 MG/ML IJ SOLN: 50.0000 ug | Freq: Once | INTRAMUSCULAR | Status: DC

## 2014-05-12 MED ORDER — ROCURONIUM BROMIDE 100 MG/10ML IV SOLN
INTRAVENOUS | Status: DC | PRN
  Administered 2014-05-12: 50 mg via INTRAVENOUS

## 2014-05-12 MED ORDER — DOCUSATE SODIUM 100 MG PO CAPS
100.0000 mg | ORAL_CAPSULE | Freq: Two times a day (BID) | ORAL | Status: DC
  Administered 2014-05-12: 100 mg via ORAL
  Filled 2014-05-12 (×3): qty 1

## 2014-05-12 MED ORDER — CEFAZOLIN SODIUM-DEXTROSE 2-3 GM-% IV SOLR: 2.0000 g | INTRAVENOUS | Status: DC

## 2014-05-12 MED ORDER — PROPOFOL 10 MG/ML IV BOLUS
INTRAVENOUS | Status: DC | PRN
  Administered 2014-05-12: 90 mg via INTRAVENOUS

## 2014-05-12 MED ORDER — MIDAZOLAM HCL 2 MG/2ML IJ SOLN
INTRAMUSCULAR | Status: AC
  Filled 2014-05-12: qty 2

## 2014-05-12 MED ORDER — SIMVASTATIN 5 MG PO TABS
5.0000 mg | ORAL_TABLET | Freq: Every day | ORAL | Status: DC
  Administered 2014-05-12: 5 mg via ORAL
  Filled 2014-05-12 (×2): qty 1

## 2014-05-12 MED ORDER — PHENYLEPHRINE HCL 10 MG/ML IJ SOLN
10.0000 mg | INTRAMUSCULAR | Status: DC | PRN
  Administered 2014-05-12: 15 ug/min via INTRAVENOUS

## 2014-05-12 MED ORDER — DIPHENHYDRAMINE HCL 12.5 MG/5ML PO ELIX: 12.5000 mg | ORAL_SOLUTION | ORAL | Status: DC | PRN

## 2014-05-12 MED ORDER — KCL IN DEXTROSE-NACL 20-5-0.45 MEQ/L-%-% IV SOLN
INTRAVENOUS | Status: DC
  Filled 2014-05-12 (×3): qty 1000

## 2014-05-12 MED ORDER — ACETAMINOPHEN 650 MG RE SUPP: 650.0000 mg | Freq: Four times a day (QID) | RECTAL | Status: DC | PRN

## 2014-05-12 MED ORDER — BUPIVACAINE-EPINEPHRINE (PF) 0.5% -1:200000 IJ SOLN
INTRAMUSCULAR | Status: DC | PRN
  Administered 2014-05-12: 30 mL

## 2014-05-12 MED ORDER — MENTHOL 3 MG MT LOZG: 1.0000 | LOZENGE | OROMUCOSAL | Status: DC | PRN

## 2014-05-12 MED ORDER — CEFAZOLIN SODIUM-DEXTROSE 2-3 GM-% IV SOLR
INTRAVENOUS | Status: AC
  Filled 2014-05-12: qty 50

## 2014-05-12 MED ORDER — PHENYLEPHRINE HCL 10 MG/ML IJ SOLN
INTRAMUSCULAR | Status: DC | PRN
  Administered 2014-05-12: 80 ug via INTRAVENOUS

## 2014-05-12 MED ORDER — METHOCARBAMOL 1000 MG/10ML IJ SOLN: 500.0000 mg | Freq: Four times a day (QID) | INTRAMUSCULAR | Status: DC | PRN

## 2014-05-12 MED ORDER — METOCLOPRAMIDE HCL 10 MG PO TABS: 5.0000 mg | ORAL_TABLET | Freq: Three times a day (TID) | ORAL | Status: DC | PRN

## 2014-05-12 MED ORDER — CLINDAMYCIN PHOSPHATE 600 MG/50ML IV SOLN
600.0000 mg | INTRAVENOUS | Status: AC
  Administered 2014-05-12: 600 mg via INTRAVENOUS
  Filled 2014-05-12: qty 50

## 2014-05-12 MED ORDER — ONDANSETRON HCL 4 MG/2ML IJ SOLN: 4.0000 mg | Freq: Four times a day (QID) | INTRAMUSCULAR | Status: DC | PRN

## 2014-05-12 MED ORDER — HYDROCHLOROTHIAZIDE 12.5 MG PO CAPS
12.5000 mg | ORAL_CAPSULE | Freq: Every day | ORAL | Status: DC
  Administered 2014-05-12: 12.5 mg via ORAL
  Filled 2014-05-12 (×2): qty 1

## 2014-05-12 MED ORDER — FENTANYL CITRATE 0.05 MG/ML IJ SOLN
INTRAMUSCULAR | Status: DC | PRN
  Administered 2014-05-12 (×2): 50 ug via INTRAVENOUS

## 2014-05-12 MED ORDER — SENNOSIDES-DOCUSATE SODIUM 8.6-50 MG PO TABS: 1.0000 | ORAL_TABLET | Freq: Every evening | ORAL | Status: DC | PRN

## 2014-05-12 MED ORDER — OXYCODONE-ACETAMINOPHEN 5-325 MG PO TABS: 1.0000 | ORAL_TABLET | ORAL | Status: DC | PRN

## 2014-05-12 MED ORDER — ASPIRIN EC 81 MG PO TBEC
81.0000 mg | DELAYED_RELEASE_TABLET | Freq: Every day | ORAL | Status: DC
  Administered 2014-05-12: 81 mg via ORAL
  Filled 2014-05-12 (×2): qty 1

## 2014-05-12 MED ORDER — 0.9 % SODIUM CHLORIDE (POUR BTL) OPTIME
TOPICAL | Status: DC | PRN
  Administered 2014-05-12: 1000 mL

## 2014-05-12 MED ORDER — METOCLOPRAMIDE HCL 5 MG/ML IJ SOLN: 5.0000 mg | Freq: Three times a day (TID) | INTRAMUSCULAR | Status: DC | PRN

## 2014-05-12 MED ORDER — HYDROMORPHONE HCL PF 1 MG/ML IJ SOLN: 0.2500 mg | INTRAMUSCULAR | Status: DC | PRN

## 2014-05-12 MED ORDER — FENTANYL CITRATE 0.05 MG/ML IJ SOLN
INTRAMUSCULAR | Status: AC
  Filled 2014-05-12: qty 5

## 2014-05-12 MED ORDER — ACETAMINOPHEN 325 MG PO TABS: 650.0000 mg | ORAL_TABLET | Freq: Four times a day (QID) | ORAL | Status: DC | PRN

## 2014-05-12 MED ORDER — PROPOFOL 10 MG/ML IV BOLUS
INTRAVENOUS | Status: AC
  Filled 2014-05-12: qty 20

## 2014-05-12 MED ORDER — KETOROLAC TROMETHAMINE 15 MG/ML IJ SOLN
15.0000 mg | Freq: Once | INTRAMUSCULAR | Status: AC
  Administered 2014-05-12: 15 mg via INTRAVENOUS
  Filled 2014-05-12: qty 1

## 2014-05-12 MED ORDER — MORPHINE SULFATE 2 MG/ML IJ SOLN: 1.0000 mg | INTRAMUSCULAR | Status: DC | PRN

## 2014-05-12 MED ORDER — ONDANSETRON HCL 4 MG PO TABS: 4.0000 mg | ORAL_TABLET | Freq: Four times a day (QID) | ORAL | Status: DC | PRN

## 2014-05-12 MED ORDER — FENTANYL CITRATE 0.05 MG/ML IJ SOLN
INTRAMUSCULAR | Status: AC
  Administered 2014-05-12: 50 ug
  Filled 2014-05-12: qty 2

## 2014-05-12 MED ORDER — HYDROCODONE-ACETAMINOPHEN 5-325 MG PO TABS
1.0000 | ORAL_TABLET | ORAL | Status: DC | PRN
  Administered 2014-05-12: 2 via ORAL
  Filled 2014-05-12: qty 2

## 2014-05-12 MED ORDER — LACTATED RINGERS IV SOLN
INTRAVENOUS | Status: DC
  Administered 2014-05-12: 09:00:00 via INTRAVENOUS

## 2014-05-12 MED ORDER — GLYCOPYRROLATE 0.2 MG/ML IJ SOLN
INTRAMUSCULAR | Status: DC | PRN
  Administered 2014-05-12: .6 mg via INTRAVENOUS

## 2014-05-12 SURGICAL SUPPLY — 64 items
BENZOIN TINCTURE PRP APPL 2/3 (GAUZE/BANDAGES/DRESSINGS) ×3 IMPLANT
BIT DRILL 2.8X4 QC CORT (BIT) ×3 IMPLANT
BIT DRILL 4 LONG FAST STEP (BIT) ×3 IMPLANT
BIT DRILL 4 SHORT FAST STEP (BIT) ×3 IMPLANT
CLOSURE WOUND 1/2 X4 (GAUZE/BANDAGES/DRESSINGS) ×1
COVER SURGICAL LIGHT HANDLE (MISCELLANEOUS) ×3 IMPLANT
DERMABOND ADVANCED (GAUZE/BANDAGES/DRESSINGS) ×2
DERMABOND ADVANCED .7 DNX12 (GAUZE/BANDAGES/DRESSINGS) ×1 IMPLANT
DRAPE C-ARM 42X72 X-RAY (DRAPES) ×6 IMPLANT
DRAPE INCISE IOBAN 66X45 STRL (DRAPES) IMPLANT
DRAPE SURG 17X23 STRL (DRAPES) ×3 IMPLANT
DRAPE U-SHAPE 47X51 STRL (DRAPES) ×3 IMPLANT
DRSG EMULSION OIL 3X3 NADH (GAUZE/BANDAGES/DRESSINGS) ×3 IMPLANT
DRSG MEPILEX BORDER 4X8 (GAUZE/BANDAGES/DRESSINGS) ×3 IMPLANT
ELECT REM PT RETURN 9FT ADLT (ELECTROSURGICAL) ×3
ELECTRODE REM PT RTRN 9FT ADLT (ELECTROSURGICAL) ×1 IMPLANT
GLOVE BIO SURGEON STRL SZ7 (GLOVE) ×3 IMPLANT
GLOVE BIO SURGEON STRL SZ7.5 (GLOVE) ×3 IMPLANT
GLOVE BIOGEL PI IND STRL 6.5 (GLOVE) ×2 IMPLANT
GLOVE BIOGEL PI IND STRL 7.0 (GLOVE) ×1 IMPLANT
GLOVE BIOGEL PI IND STRL 7.5 (GLOVE) ×2 IMPLANT
GLOVE BIOGEL PI IND STRL 8 (GLOVE) ×2 IMPLANT
GLOVE BIOGEL PI INDICATOR 6.5 (GLOVE) ×4
GLOVE BIOGEL PI INDICATOR 7.0 (GLOVE) ×2
GLOVE BIOGEL PI INDICATOR 7.5 (GLOVE) ×4
GLOVE BIOGEL PI INDICATOR 8 (GLOVE) ×4
GLOVE ECLIPSE 6.5 STRL STRAW (GLOVE) ×3 IMPLANT
GLOVE ECLIPSE 7.0 STRL STRAW (GLOVE) ×6 IMPLANT
GLOVE ORTHO TXT STRL SZ7.5 (GLOVE) ×3 IMPLANT
GLOVE ORTHOPEDIC STR SZ6.5 (GLOVE) ×3 IMPLANT
GOWN STRL REUS W/ TWL LRG LVL3 (GOWN DISPOSABLE) ×1 IMPLANT
GOWN STRL REUS W/TWL LRG LVL3 (GOWN DISPOSABLE) ×2
KIT BASIN OR (CUSTOM PROCEDURE TRAY) ×3 IMPLANT
KIT ROOM TURNOVER OR (KITS) ×3 IMPLANT
MANIFOLD NEPTUNE II (INSTRUMENTS) ×3 IMPLANT
NEEDLE HYPO 25GX1X1/2 BEV (NEEDLE) IMPLANT
NS IRRIG 1000ML POUR BTL (IV SOLUTION) ×3 IMPLANT
PACK SHOULDER (CUSTOM PROCEDURE TRAY) ×3 IMPLANT
PAD ARMBOARD 7.5X6 YLW CONV (MISCELLANEOUS) ×6 IMPLANT
PEG STND 4.0X20.0MM (Orthopedic Implant) ×3 IMPLANT
PEG STND 4.0X25.0MM (Orthopedic Implant) ×3 IMPLANT
PEG STND 4.0X30MM (Orthopedic Implant) ×6 IMPLANT
PEG STND 4.0X35MM (Orthopedic Implant) ×3 IMPLANT
PEG STND 4.0X42.5MM (Orthopedic Implant) ×3 IMPLANT
PEGSTD 4.0X20.0MM (Orthopedic Implant) ×1 IMPLANT
PEGSTD 4.0X30MM (Orthopedic Implant) ×2 IMPLANT
PEGSTD 4.0X35MM (Orthopedic Implant) ×1 IMPLANT
PEGSTD 4.0X42.5MM (Orthopedic Implant) ×1 IMPLANT
PIN GUIDE SHOULDER 2.0MM (PIN) ×3 IMPLANT
PLATE SHOULDER S3 3HOLE LT (Plate) ×3 IMPLANT
SCREW MULTIDIR 3.8X24 HUMRL (Screw) ×3 IMPLANT
SCREW MULTIDIR 3.8X26 HUMRL (Screw) ×3 IMPLANT
SPONGE GAUZE 4X4 12PLY (GAUZE/BANDAGES/DRESSINGS) ×3 IMPLANT
SPONGE LAP 18X18 X RAY DECT (DISPOSABLE) ×6 IMPLANT
STRIP CLOSURE SKIN 1/2X4 (GAUZE/BANDAGES/DRESSINGS) ×2 IMPLANT
SUCTION FRAZIER TIP 10 FR DISP (SUCTIONS) ×3 IMPLANT
SUT BONE WAX W31G (SUTURE) ×3 IMPLANT
SUT FIBERWIRE #2 38 T-5 BLUE (SUTURE)
SUT VIC AB 2-0 CT1 27 (SUTURE) ×2
SUT VIC AB 2-0 CT1 TAPERPNT 27 (SUTURE) ×1 IMPLANT
SUT VIC AB 3-0 FS2 27 (SUTURE) ×3 IMPLANT
SUTURE FIBERWR #2 38 T-5 BLUE (SUTURE) IMPLANT
SYR CONTROL 10ML LL (SYRINGE) IMPLANT
WATER STERILE IRR 1000ML POUR (IV SOLUTION) ×3 IMPLANT

## 2014-05-12 NOTE — Anesthesia Procedure Notes (Signed)
Anesthesia Regional Block:  Interscalene brachial plexus block  Pre-Anesthetic Checklist: ,, timeout performed, Correct Patient, Correct Site, Correct Laterality, Correct Procedure, Correct Position, site marked, Risks and benefits discussed, pre-op evaluation,  At surgeon's request and post-op pain management  Laterality: Left  Prep: Maximum Sterile Barrier Precautions used and chloraprep       Needles:  Injection technique: Single-shot  Needle Type: Echogenic Stimulator Needle     Needle Length: 5cm 5 cm Needle Gauge: 22 and 22 G    Additional Needles:  Procedures: ultrasound guided (picture in chart) and nerve stimulator Interscalene brachial plexus block  Nerve Stimulator or Paresthesia:  Response: Biceps response,   Additional Responses:   Narrative:  Start time: 05/12/2014 8:50 AM End time: 05/12/2014 9:00 AM Injection made incrementally with aspirations every 5 mL. Anesthesiologist: Sampson Goon, MD  Additional Notes: 2% Lidocaine skin wheel.

## 2014-05-12 NOTE — Brief Op Note (Signed)
05/12/2014  12:35 PM  PATIENT:  Janace Aris  80 y.o. female  PRE-OPERATIVE DIAGNOSIS:  Left Proximal Humerus Fracture  POST-OPERATIVE DIAGNOSIS:  Left Proximal Humerus Fracture  PROCEDURE:  Procedure(s) with comments: OPEN REDUCTION INTERNAL FIXATION (ORIF) PROXIMAL HUMERUS FRACTURE (Left) - Open Reduction Internal Fixation Left Proximal Humerus  SURGEON:  Surgeon(s) and Role:    * Eldred Manges, MD - Primary  PHYSICIAN ASSISTANT: Maud Deed PAC  ASSISTANTS: none   ANESTHESIA:   general  EBL:  Total I/O In: 1000 [I.V.:1000] Out: -   BLOOD ADMINISTERED:none  DRAINS: none   LOCAL MEDICATIONS USED:  NONE  SPECIMEN:  No Specimen  DISPOSITION OF SPECIMEN:  N/A  COUNTS:  YES  TOURNIQUET:  * No tourniquets in log *  DICTATION: .Note written in EPIC  PLAN OF CARE: Admit for overnight observation  PATIENT DISPOSITION:  PACU - hemodynamically stable.   Delay start of Pharmacological VTE agent (>24hrs) due to surgical blood loss or risk of bleeding: yes

## 2014-05-12 NOTE — Anesthesia Preprocedure Evaluation (Addendum)
Anesthesia Evaluation  Patient identified by MRN, date of birth, ID band Patient awake    Reviewed: Allergy & Precautions, H&P , NPO status , Patient's Chart, lab work & pertinent test results  Airway Mallampati: II TM Distance: >3 FB Neck ROM: Full    Dental no notable dental hx. (+) Teeth Intact, Dental Advisory Given   Pulmonary neg pulmonary ROS,  breath sounds clear to auscultation  Pulmonary exam normal       Cardiovascular hypertension, On Medications Rhythm:Regular Rate:Normal     Neuro/Psych negative neurological ROS  negative psych ROS   GI/Hepatic Neg liver ROS, GERD-  Medicated and Controlled,  Endo/Other  negative endocrine ROS  Renal/GU negative Renal ROS  negative genitourinary   Musculoskeletal   Abdominal   Peds  Hematology negative hematology ROS (+)   Anesthesia Other Findings   Reproductive/Obstetrics negative OB ROS                          Anesthesia Physical Anesthesia Plan  ASA: II  Anesthesia Plan: General and Regional   Post-op Pain Management:    Induction: Intravenous  Airway Management Planned: Oral ETT  Additional Equipment:   Intra-op Plan:   Post-operative Plan: Extubation in OR  Informed Consent: I have reviewed the patients History and Physical, chart, labs and discussed the procedure including the risks, benefits and alternatives for the proposed anesthesia with the patient or authorized representative who has indicated his/her understanding and acceptance.   Dental advisory given  Plan Discussed with: CRNA  Anesthesia Plan Comments:         Anesthesia Quick Evaluation

## 2014-05-12 NOTE — H&P (Signed)
Melissa Hall is an 80 y.o. female.   Chief Complaint: fall  3 days ago with left proximal humerus displaced fracture.  HPI: fall with left shoulder pain and xrays showed displaced prox humerus fracture neck and tuberosity.     Past Medical History  Diagnosis Date  . Hypertension   . Arthritis   . High cholesterol   . GERD (gastroesophageal reflux disease)     Past Surgical History  Procedure Laterality Date  . Joint replacement      bilat  . Abdominal hysterectomy    . Back surgery    . Cataract extraction w/phaco  10/18/2012    Procedure: CATARACT EXTRACTION PHACO AND INTRAOCULAR LENS PLACEMENT (IOC);  Surgeon: Tonny Branch, MD;  Location: [facility];  Service: Ophthalmology;  Laterality: Left;  CDE:17.69  . Colonoscopy    . Tonsillectomy      Family History  Problem Relation Age of Onset  . Heart disease Mother   . Heart disease Father    Social History:  reports that she has never smoked. She has never used smokeless tobacco. She reports that she does not drink alcohol or use illicit drugs.  Allergies:  Allergies  Allergen Reactions  . Keflex [Cephalexin] Rash    Medications Prior to Admission  Medication Sig Dispense Refill  . aspirin EC 81 MG tablet Take 81 mg by mouth daily.      . cholecalciferol (VITAMIN D) 1000 UNITS tablet Take 3,000 Units by mouth daily.       . hydrochlorothiazide (MICROZIDE) 12.5 MG capsule Take 12.5 mg by mouth daily.      Marland Kitchen HYDROcodone-acetaminophen (NORCO/VICODIN) 5-325 MG per tablet Take 1 tablet by mouth every 6 (six) hours as needed for moderate pain.  20 tablet  0  . Multiple Vitamins-Minerals (MULTIVITAMIN WITH MINERALS) tablet Take 1 tablet by mouth daily.      . pravastatin (PRAVACHOL) 10 MG tablet Take 10 mg by mouth daily.        Results for orders placed during the hospital encounter of 05/12/14 (from the past 48 hour(s))  BASIC METABOLIC PANEL     Status: Abnormal   Collection Time    05/12/14  8:09 AM      Result Value Ref  Range   Sodium 143  137 - 147 mEq/L   Potassium 3.0 (*) 3.7 - 5.3 mEq/L   Chloride 101  96 - 112 mEq/L   CO2 30  19 - 32 mEq/L   Glucose, Bld 104 (*) 70 - 99 mg/dL   BUN 8  6 - 23 mg/dL   Creatinine, Ser 0.47 (*) 0.50 - 1.10 mg/dL   Calcium 9.3  8.4 - 10.5 mg/dL   GFR calc non Af Amer 89 (*) >90 mL/min   GFR calc Af Amer >90  >90 mL/min   Comment: (NOTE)     The eGFR has been calculated using the CKD EPI equation.     This calculation has not been validated in all clinical situations.     eGFR's persistently <90 mL/min signify possible Chronic Kidney     Disease.  CBC     Status: Abnormal   Collection Time    05/12/14  8:09 AM      Result Value Ref Range   WBC 6.6  4.0 - 10.5 K/uL   RBC 3.71 (*) 3.87 - 5.11 MIL/uL   Hemoglobin 11.8 (*) 12.0 - 15.0 g/dL   HCT 35.5 (*) 36.0 - 46.0 %   MCV 95.7  78.0 - 100.0 fL   MCH 31.8  26.0 - 34.0 pg   MCHC 33.2  30.0 - 36.0 g/dL   RDW 12.9  11.5 - 15.5 %   Platelets 168  150 - 400 K/uL   Dg Chest 1 View  05/12/2014   CLINICAL DATA:  Preoperative humeral fracture  EXAM: CHEST - 1 VIEW  COMPARISON:  July 25, 2005  FINDINGS: There is no edema or consolidation. Heart is upper normal in size with normal pulmonary vascularity. No adenopathy. There is a focal hiatal hernia. There is a fracture of the proximal humerus, displaced.  IMPRESSION: Hiatal hernia. No edema or consolidation. Proximal left humerus fracture.   Electronically Signed   By: Lowella Grip M.D.   On: 05/12/2014 08:48    Review of Systems  Constitutional: Negative for fever and chills.  HENT: Negative for tinnitus.   Eyes:       Glasses  Respiratory: Negative for cough.   Cardiovascular: Negative for chest pain.  Gastrointestinal: Negative for heartburn.  Musculoskeletal:       Bilat TKA with left TKA revision .   Skin: Negative for rash.  Neurological: Negative for dizziness and headaches.  Psychiatric/Behavioral: Negative for depression and hallucinations. The patient  does not have insomnia.     Blood pressure 130/69, pulse 105, resp. rate 19, height 5' (1.524 m), weight 59.875 kg (132 lb), SpO2 100.00%. Physical Exam  Constitutional: She appears well-developed and well-nourished.  HENT:  Head: Normocephalic.  Eyes: EOM are normal. Pupils are equal, round, and reactive to light.  Neck: Normal range of motion. Neck supple.  Cardiovascular: Normal rate and normal heart sounds.   Respiratory: Effort normal and breath sounds normal.  GI: Soft. Bowel sounds are normal.  Musculoskeletal:  Left axillary sensation intact, sensation to hand intact. eccymosis about shoulder and upper arm.   Skin: Skin is warm and dry.  Psychiatric: She has a normal mood and affect. Her behavior is normal. Judgment and thought content normal.     Assessment/Plan Comminuted closed left prox humerus fracture for ORIF. Procedure discussed. She requests we proceed.   [name] 05/12/2014, 9:33 AM

## 2014-05-12 NOTE — Anesthesia Postprocedure Evaluation (Signed)
  Anesthesia Post-op Note  Patient: Melissa Hall  Procedure(s) Performed: Procedure(s) with comments: OPEN REDUCTION INTERNAL FIXATION (ORIF) PROXIMAL HUMERUS FRACTURE (Left) - Open Reduction Internal Fixation Left Proximal Humerus  Patient Location: PACU  Anesthesia Type:General and block  Level of Consciousness: awake and alert   Airway and Oxygen Therapy: Patient Spontanous Breathing  Post-op Pain: none  Post-op Assessment: Post-op Vital signs reviewed, Patient's Cardiovascular Status Stable and Respiratory Function Stable  Post-op Vital Signs: Reviewed  Filed Vitals:   05/12/14 1300  BP: 130/85  Pulse: 99  Temp:   Resp:     Complications: No apparent anesthesia complications

## 2014-05-12 NOTE — Transfer of Care (Signed)
Immediate Anesthesia Transfer of Care Note  Patient: Melissa Hall  Procedure(s) Performed: Procedure(s) with comments: OPEN REDUCTION INTERNAL FIXATION (ORIF) PROXIMAL HUMERUS FRACTURE (Left) - Open Reduction Internal Fixation Left Proximal Humerus  Patient Location: PACU  Anesthesia Type:General and Regional  Level of Consciousness: awake, alert  and oriented  Airway & Oxygen Therapy: Patient Spontanous Breathing  Post-op Assessment: Report given to PACU RN  Post vital signs: Reviewed and stable  Complications: No apparent anesthesia complications

## 2014-05-12 NOTE — Progress Notes (Signed)
Report given to johny rn as caregiver 

## 2014-05-12 NOTE — Progress Notes (Signed)
Utilization review completed.  

## 2014-05-13 DIAGNOSIS — S42202A Unspecified fracture of upper end of left humerus, initial encounter for closed fracture: Secondary | ICD-10-CM | POA: Diagnosis present

## 2014-05-13 NOTE — Evaluation (Signed)
Occupational Therapy Evaluation Patient Details Name: Melissa ArisMary W Hall MRN: [id number] DOB: 1928/02/04 Today's Date: 05/13/2014    History of Present Illness s/p L ORIF proximal humerus fracture   Clinical Impression   Pt admitted with the above diagnoses. Pt and family education given on techniques for safe completion of ADLs to maximize independence with basic ADLs prior to d/c home. Pt will have family present 24/7 to assist with ADLs as needed. Reviewed precautions, exercises for neck, elbow, wrist, and hand. Sling education provided. Pt ambulated with 1+ hand held assist to steady balance; baseline ambulation is mod I with a cane.      Follow Up Recommendations  Supervision/Assistance - 24 hour    Equipment Recommendations       Recommendations for Other Services       Precautions / Restrictions Precautions Precautions: Shoulder Type of Shoulder Precautions: NWB, no motion at shoulder Shoulder Interventions: Shoulder sling/immobilizer;Off for dressing/bathing/exercises Precaution Booklet Issued: Yes (comment) Required Braces or Orthoses: Sling Restrictions Weight Bearing Restrictions: Yes LUE Weight Bearing: Non weight bearing      Mobility Bed Mobility Overal bed mobility: Needs Assistance Bed Mobility: Supine to Sit     Supine to sit: Supervision;HOB elevated        Transfers Overall transfer level: Needs assistance Equipment used: 1 person hand held assist Transfers: Sit to/from Stand Sit to Stand: Min guard         General transfer comment: 1+ hand held assist to steady balance when walking. Pt used cane PTA but did not see it in the room. Pt reports h/o falls at home. Pt able to identify looking up as a trigger for falls.    Balance Overall balance assessment: History of Falls;Needs assistance Sitting-balance support: Feet supported;Single extremity supported Sitting balance-Leahy Scale: Good     Standing balance support: Single extremity  supported;During functional activity Standing balance-Leahy Scale: Poor Standing balance comment: Pt requires steading assist OOB, this is baseline.                            ADL Overall ADL's : Needs assistance/impaired         Upper Body Bathing: Sitting;Moderate assistance   Lower Body Bathing: Sit to/from stand;Moderate assistance   Upper Body Dressing : Sitting;Moderate assistance   Lower Body Dressing: Sit to/from stand;Moderate assistance   Toilet Transfer: Min guard;Ambulation;Comfort height toilet   Toileting- Clothing Manipulation and Hygiene: Minimal assistance;Sit to/from stand   Tub/ Shower Transfer: Min guard;Ambulation;Shower seat   Functional mobility during ADLs: Min guard General ADL Comments: Education given to pt and family on techniques for safe completion of ADLs; reviewed precautions, provided handout. Pt has 24/7 assistance at d/c from spouse and daughters.     Vision                     Perception     Praxis      Pertinent Vitals/Pain Minimal pain, increased activity, repositioned sling.     Hand Dominance     Extremity/Trunk Assessment Upper Extremity Assessment Upper Extremity Assessment: LUE deficits/detail LUE Deficits / Details: s/p ORIF proximal humerus fracture   Lower Extremity Assessment Lower Extremity Assessment: Overall WFL for tasks assessed       Communication Communication Communication: No difficulties   Cognition Arousal/Alertness: Awake/alert Behavior During Therapy: WFL for tasks assessed/performed Overall Cognitive Status: Within Functional Limits for tasks assessed  General Comments       Exercises Exercises: Shoulder     Shoulder Instructions Shoulder Instructions Donning/doffing shirt without moving shoulder: Minimal assistance Method for sponge bathing under operated UE: Min-guard Donning/doffing sling/immobilizer: Moderate assistance Correct  positioning of sling/immobilizer: Moderate assistance ROM for elbow, wrist and digits of operated UE: Min-guard (sitting) Sling wearing schedule (on at all times/off for ADL's): Supervision/safety Proper positioning of operated UE when showering: Supervision/safety Positioning of UE while sleeping: Supervision/safety    Home Living Family/patient expects to be discharged to:: Private residence Living Arrangements: Spouse/significant other;Children Available Help at Discharge: Available 24 hours/day;Family Type of Home: House Home Access: Stairs to enter Secretary/administrator of Steps: 1   Home Layout: One level     Bathroom Shower/Tub: Producer, television/film/video: Handicapped height     Home Equipment: Environmental consultant - 2 wheels;Cane - quad;Cane - single point;Shower seat          Prior Functioning/Environment Level of Independence: Independent with assistive device(s)        Comments: used a cane PTA    OT Diagnosis:     OT Problem List:     OT Treatment/Interventions:      OT Goals(Current goals can be found in the care plan section) Acute Rehab OT Goals Patient Stated Goal: not stated  OT Frequency:     Barriers to D/C:            Co-evaluation              End of Session Equipment Utilized During Treatment: Gait belt;Other (comment) (sling)  Activity Tolerance: Patient tolerated treatment well Patient left: in bed;with call bell/phone within reach;with family/visitor present   Time: 0045-9977 OT Time Calculation (min): 36 min Charges:  OT General Charges $OT Visit: 1 Procedure OT Evaluation $Initial OT Evaluation Tier I: 1 Procedure OT Treatments $Self Care/Home Management : 23-37 mins G-Codes: OT G-codes **NOT FOR INPATIENT CLASS** Functional Assessment Tool Used: clinical judgement Functional Limitation: Self care Self Care Current Status (S1423): At least 20 percent but less than 40 percent impaired, limited or restricted Self Care Goal  Status (T5320): At least 20 percent but less than 40 percent impaired, limited or restricted Self Care Discharge Status [phone]): At least 20 percent but less than 40 percent impaired, limited or restricted  Pilar Grammes 05/13/2014, 11:04 AM

## 2014-05-13 NOTE — Progress Notes (Signed)
Written and verbal instructions given to patient and family. Prescription for analgesic given to husband. Patient and family state understanding of all.

## 2014-05-13 NOTE — Op Note (Signed)
NAMEMarland Hall  [name], [name]               ACCOUNT NO.:  [id number]  MEDICAL RECORD NO.:  [id number]  LOCATION:  [id number]                        FACILITY:  [facility]  PHYSICIAN:  Gene Colee C. Ophelia Charter, M.D.    DATE OF BIRTH:  05-15-1928  DATE OF PROCEDURE:  05/12/2014 DATE OF DISCHARGE:                              OPERATIVE REPORT   PREOPERATIVE DIAGNOSIS:  Comminuted left proximal humerus fracture, displaced.  POSTOPERATIVE DIAGNOSIS:  Comminuted left proximal humerus fracture, displaced.  PROCEDURE:  ORIF, left proximal humerus.  SURGEON:  Janeice Stegall C. Ophelia Charter, M.D.  ASSISTANT:  Wende Neighbors, P.A-C, medically necessary and present for the entire procedure.  ANESTHESIA:  General plus preoperative block.  EBL:  Less than 100 mL.  IMPLANTS:  Biomet anatomic locking plate with multiple smooth pegs locked.  DESCRIPTION OF PROCEDURE:  After induction of general anesthesia, clindamycin antibiotics given preoperatively for prophylaxis due to the patient's Keflex allergy.  Prepping was performed with DuraPrep, usual split sheets drapes, shoulder frame with the patient in beach chair position was used.  Usual impervious stockinette, Coban, sterile skin marker and Betadine, Steri-Drape was used to seal the skin.  Time-out procedure was completed.  Deltopectoral approach was made.  The cephalic vein was taken laterally.  Fracture was noted.  Long head of the biceps was noted to be swollen and directly laid over the fracture site.  With some difficulty, the fracture was reduced.  The head was mushy with the patient's age 80 and a couple of K-wires were placed, try to hold the fracture together.  A plate was selected, placed in place.  Center pin was drilled up in the head and then a shaft screw on the slot was drilled, 24 mm in length and then slid and adjusted and tightened down. Second screw 26 was placed.  Multiple lag screws were then drilled. Based on that, measurements up into the head.  Head was  rotated to make sure none penetrated.  The ball was on the stick.  There was slight angulation residual, which was minimal.  Head was rotated under C-arm with no evidence of pegs penetrating through the joint.  Copious irrigation, standard closure with 2-0 Vicryl subcutaneous tissue, 3-0 Vicryl closure subcuticular closure, Dermabond on the skin.  Postop dressing and sling.  Instrument count and needle count was correct.     Yancarlos Berthold C. Ophelia Charter, M.D.     MCY/MEDQ  D:  05/12/2014  T:  05/13/2014  Job:  [id number]

## 2014-05-13 NOTE — Progress Notes (Signed)
Subjective: 1 Day Post-Op Procedure(s) (LRB): OPEN REDUCTION INTERNAL FIXATION (ORIF) PROXIMAL HUMERUS FRACTURE (Left) Patient reports pain as moderate.    Objective: Vital signs in last 24 hours: Temp:  [97.9 F (36.6 C)-99.6 F (37.6 C)] 99.3 F (37.4 C) (05/23 0441) Pulse Rate:  [72-100] 72 (05/23 0441) Resp:  [17-20] 18 (05/23 0441) BP: (92-137)/(48-86) 97/48 mmHg (05/23 0441) SpO2:  [93 %-100 %] 93 % (05/23 0441)  Intake/Output from previous day: 05/22 0701 - 05/23 0700 In: 1300 [P.O.:100; I.V.:1200] Out: -  Intake/Output this shift:     Recent Labs  05/12/14 0809  HGB 11.8*    Recent Labs  05/12/14 0809  WBC 6.6  RBC 3.71*  HCT 35.5*  PLT 168    Recent Labs  05/12/14 0809  NA 143  K 3.0*  CL 101  CO2 30  BUN 8  CREATININE 0.47*  GLUCOSE 104*  CALCIUM 9.3   No results found for this basename: LABPT, INR,  in the last 72 hours  Neurologically intact  Assessment/Plan: 1 Day Post-Op Procedure(s) (LRB): OPEN REDUCTION INTERNAL FIXATION (ORIF) PROXIMAL HUMERUS FRACTURE (Left) Up with therapy ambulate then she can be discharged.   Eldred Manges 05/13/2014, 10:13 AM

## 2014-05-16 ENCOUNTER — Encounter (HOSPITAL_COMMUNITY): Payer: Self-pay | Admitting: Orthopaedic Surgery

## 2014-05-29 NOTE — Discharge Summary (Signed)
Physician Discharge Summary  Patient ID: Melissa Hall Giannone MRN: [id number] DOB/AGE: Nov 22, 1928 80 y.o.  Admit date: 05/12/2014 Discharge date: 05/13/2014  Admission Diagnoses:  Traumatic closed displaced fracture of proximal end of left humerus  Discharge Diagnoses:  Principal Problem:   Traumatic closed displaced fracture of proximal end of left humerus Active Problems:   Proximal humerus fracture   Fracture of proximal end of left humerus   Past Medical History  Diagnosis Date  . Hypertension   . Arthritis   . High cholesterol   . GERD (gastroesophageal reflux disease)     Surgeries: Procedure(s): OPEN REDUCTION INTERNAL FIXATION (ORIF) PROXIMAL HUMERUS FRACTURE on 05/12/2014   Consultants (if any):  none  Discharged Condition: Improved  Hospital Course: Melissa Hall Colwell is an 80 y.o. female who was admitted 05/12/2014 with a diagnosis of Traumatic closed displaced fracture of proximal end of left humerus and went to the operating room on 05/12/2014 and underwent the above named procedures.    She was given perioperative antibiotics:  Anti-infectives   Start     Dose/Rate Route Frequency Ordered Stop   05/12/14 0942  clindamycin (CLEOCIN) IVPB 600 mg     600 mg 100 mL/hr over 30 Minutes Intravenous 60 min pre-op 05/12/14 0939 05/12/14 1110   05/12/14 0759  ceFAZolin (ANCEF) 2-3 GM-% IVPB SOLR    Comments:  Scronce, Trina   : cabinet override      05/12/14 0759 05/12/14 2014   05/12/14 0755  ceFAZolin (ANCEF) IVPB 2 g/50 mL premix  Status:  Discontinued     2 g 100 mL/hr over 30 Minutes Intravenous On call to O.R. 05/12/14 [id number] 05/12/14 0939    .  She was given sequential compression devices, early ambulation for DVT prophylaxis.  She benefited maximally from the hospital stay and there were no complications.    Recent vital signs:  Filed Vitals:   05/13/14 0441  BP: 97/48  Pulse: 72  Temp: 99.3 F (37.4 C)  Resp: 18    Recent laboratory studies:  Lab Results   Component Value Date   HGB 11.8* 05/12/2014   HGB 12.7 04/24/2014   HGB 13.0 12/14/2013   Lab Results  Component Value Date   WBC 6.6 05/12/2014   PLT 168 05/12/2014   No results found for this basename: INR   Lab Results  Component Value Date   NA 143 05/12/2014   K 3.0* 05/12/2014   CL 101 05/12/2014   CO2 30 05/12/2014   BUN 8 05/12/2014   CREATININE 0.47* 05/12/2014   GLUCOSE 104* 05/12/2014    Discharge Medications:     Medication List    STOP taking these medications       HYDROcodone-acetaminophen 5-325 MG per tablet  Commonly known as:  NORCO/VICODIN      TAKE these medications       aspirin EC 81 MG tablet  Take 81 mg by mouth daily.     cholecalciferol 1000 UNITS tablet  Commonly known as:  VITAMIN D  Take 3,000 Units by mouth daily.     hydrochlorothiazide 12.5 MG capsule  Commonly known as:  MICROZIDE  Take 12.5 mg by mouth daily.     multivitamin with minerals tablet  Take 1 tablet by mouth daily.     oxyCODONE-acetaminophen 5-325 MG per tablet  Commonly known as:  ROXICET  Take 1-2 tablets by mouth every 4 (four) hours as needed.     pravastatin 10 MG tablet  Commonly known as:  PRAVACHOL  Take 10 mg by mouth daily.        Diagnostic Studies: Dg Chest 1 View  05/12/2014   CLINICAL DATA:  Preoperative humeral fracture  EXAM: CHEST - 1 VIEW  COMPARISON:  July 25, 2005  FINDINGS: There is no edema or consolidation. Heart is upper normal in size with normal pulmonary vascularity. No adenopathy. There is a focal hiatal hernia. There is a fracture of the proximal humerus, displaced.  IMPRESSION: Hiatal hernia. No edema or consolidation. Proximal left humerus fracture.   Electronically Signed   By: Bretta Bang M.D.   On: 05/12/2014 08:48   Dg Shoulder Left  05/09/2014   CLINICAL DATA:  Traumatic injury and pain  EXAM: LEFT SHOULDER - 2+ VIEW  COMPARISON:  None.  FINDINGS: There is a comminuted fracture involving the proximal left humerus  predominately through the surgical neck. There is not appear to be dislocation of the humeral head from the glenoid fossa. Impaction is noted at the fracture site. No other fracture is noted.  IMPRESSION: Comminuted proximal left humeral fracture   Electronically Signed   By: Alcide Clever M.D.   On: 05/09/2014 17:16   Dg Humerus Left  05/23/2014   CLINICAL DATA:  Open reduction internal fixation for proximal humerus fracture  EXAM: DG C-ARM 61-120 MIN ; LEFT SHOULDER --2 VIEW  COMPARISON:  May 09, 2014  FINDINGS: Frontal and lateral views were obtained, demonstrating screw and plate fixation to a fracture of the proximal humeral metaphysis. Alignment is essentially anatomic. No dislocation. No new fracture.  IMPRESSION: Alignment essentially anatomic following screw and plate fixation in proximal humerus.   Electronically Signed   By: Bretta Bang M.D.   On: 05/23/2014 15:19   Dg C-arm 1-60 Min  05/12/2014   CLINICAL DATA:  Open reduction internal fixation for proximal humerus fracture  EXAM: DG C-ARM 61-120 MIN ; LEFT SHOULDER --2 VIEW  COMPARISON:  May 09, 2014  FINDINGS: Frontal and lateral views were obtained, demonstrating screw and plate fixation to a fracture of the proximal humeral metaphysis. Alignment is essentially anatomic. No dislocation. No new fracture.  IMPRESSION: Alignment essentially anatomic following screw and plate fixation in proximal humerus.   Electronically Signed   By: Bretta Bang M.D.   On: 05/12/2014 15:12    Disposition: 01-Home or Self Care  DISCHARGE INSTRUCTIONS: sling at all times.  No motion of the left shoulder.  Ice packs to shoulder for pain and swelling. Change dressing daily or as needed.  Keep wound covered for 2 weeks. Move fingers frequently.      Follow-up Information   Follow up with Eldred Manges, MD. Schedule an appointment as soon as possible for a visit in 2 weeks.   Specialty:  Orthopedic Surgery   Contact information:   [address]  [place] [address] [phone]        Signed: Wende Neighbors 05/29/2014, 2:48 PM

## 2014-05-29 NOTE — Discharge Instructions (Signed)
sling at all times.  No motion of the left shoulder.  Ice packs to shoulder for pain and swelling. Change dressing daily or as needed.  Keep wound covered for 2 weeks. Move fingers frequently.

## 2014-06-07 ENCOUNTER — Ambulatory Visit: Payer: Medicare Other | Attending: Orthopaedic Surgery | Admitting: Physical Therapy

## 2014-06-07 DIAGNOSIS — Z9889 Other specified postprocedural states: Secondary | ICD-10-CM | POA: Insufficient documentation

## 2014-06-07 DIAGNOSIS — IMO0001 Reserved for inherently not codable concepts without codable children: Secondary | ICD-10-CM | POA: Insufficient documentation

## 2014-06-07 DIAGNOSIS — R5381 Other malaise: Secondary | ICD-10-CM | POA: Insufficient documentation

## 2014-06-07 DIAGNOSIS — Z96659 Presence of unspecified artificial knee joint: Secondary | ICD-10-CM | POA: Insufficient documentation

## 2014-06-07 DIAGNOSIS — M25519 Pain in unspecified shoulder: Secondary | ICD-10-CM | POA: Diagnosis not present

## 2014-06-07 DIAGNOSIS — M25619 Stiffness of unspecified shoulder, not elsewhere classified: Secondary | ICD-10-CM | POA: Insufficient documentation

## 2014-06-09 ENCOUNTER — Ambulatory Visit: Payer: Medicare Other | Admitting: Physical Therapy

## 2014-06-09 DIAGNOSIS — IMO0001 Reserved for inherently not codable concepts without codable children: Secondary | ICD-10-CM | POA: Diagnosis not present

## 2014-06-13 ENCOUNTER — Ambulatory Visit: Payer: Medicare Other | Admitting: Physical Therapy

## 2014-06-13 DIAGNOSIS — IMO0001 Reserved for inherently not codable concepts without codable children: Secondary | ICD-10-CM | POA: Diagnosis not present

## 2014-06-15 ENCOUNTER — Other Ambulatory Visit: Payer: Self-pay | Admitting: Family Medicine

## 2014-06-15 ENCOUNTER — Ambulatory Visit: Payer: Medicare Other | Admitting: Physical Therapy

## 2014-06-15 DIAGNOSIS — IMO0001 Reserved for inherently not codable concepts without codable children: Secondary | ICD-10-CM | POA: Diagnosis not present

## 2014-06-19 ENCOUNTER — Ambulatory Visit: Payer: Medicare Other | Admitting: Physical Therapy

## 2014-06-19 DIAGNOSIS — IMO0001 Reserved for inherently not codable concepts without codable children: Secondary | ICD-10-CM | POA: Diagnosis not present

## 2014-06-22 ENCOUNTER — Ambulatory Visit: Payer: Medicare Other | Attending: Orthopaedic Surgery | Admitting: Physical Therapy

## 2014-06-22 ENCOUNTER — Other Ambulatory Visit: Payer: Self-pay | Admitting: Family Medicine

## 2014-06-22 DIAGNOSIS — M25519 Pain in unspecified shoulder: Secondary | ICD-10-CM | POA: Insufficient documentation

## 2014-06-22 DIAGNOSIS — IMO0001 Reserved for inherently not codable concepts without codable children: Secondary | ICD-10-CM | POA: Insufficient documentation

## 2014-06-22 DIAGNOSIS — M25619 Stiffness of unspecified shoulder, not elsewhere classified: Secondary | ICD-10-CM | POA: Insufficient documentation

## 2014-06-22 DIAGNOSIS — Z9889 Other specified postprocedural states: Secondary | ICD-10-CM | POA: Insufficient documentation

## 2014-06-22 DIAGNOSIS — R5381 Other malaise: Secondary | ICD-10-CM | POA: Diagnosis not present

## 2014-06-22 DIAGNOSIS — Z96659 Presence of unspecified artificial knee joint: Secondary | ICD-10-CM | POA: Diagnosis not present

## 2014-06-27 ENCOUNTER — Ambulatory Visit: Payer: Medicare Other | Admitting: *Deleted

## 2014-06-27 DIAGNOSIS — IMO0001 Reserved for inherently not codable concepts without codable children: Secondary | ICD-10-CM | POA: Diagnosis not present

## 2014-06-30 ENCOUNTER — Ambulatory Visit: Payer: Medicare Other | Admitting: Physical Therapy

## 2014-06-30 DIAGNOSIS — IMO0001 Reserved for inherently not codable concepts without codable children: Secondary | ICD-10-CM | POA: Diagnosis not present

## 2014-07-04 ENCOUNTER — Ambulatory Visit: Payer: Medicare Other | Admitting: Physical Therapy

## 2014-07-04 DIAGNOSIS — IMO0001 Reserved for inherently not codable concepts without codable children: Secondary | ICD-10-CM | POA: Diagnosis not present

## 2014-07-07 ENCOUNTER — Ambulatory Visit: Payer: Medicare Other | Admitting: Physical Therapy

## 2014-07-10 ENCOUNTER — Telehealth: Payer: Self-pay | Admitting: Pharmacist

## 2014-07-10 ENCOUNTER — Other Ambulatory Visit: Payer: Self-pay | Admitting: Pharmacist

## 2014-07-10 NOTE — Telephone Encounter (Signed)
Patient was called regarding reent fracture of humerus.  She started alendronate in 01/2014.  She is to continue alendronate and we will check BMD in 01/2015 instead of waiting until 2017 due to recent fracture.

## 2014-07-11 ENCOUNTER — Ambulatory Visit: Payer: Medicare Other | Admitting: *Deleted

## 2014-07-11 DIAGNOSIS — IMO0001 Reserved for inherently not codable concepts without codable children: Secondary | ICD-10-CM | POA: Diagnosis not present

## 2014-07-14 ENCOUNTER — Ambulatory Visit: Payer: Medicare Other | Admitting: Physical Therapy

## 2014-07-14 DIAGNOSIS — IMO0001 Reserved for inherently not codable concepts without codable children: Secondary | ICD-10-CM | POA: Diagnosis not present

## 2014-07-18 ENCOUNTER — Ambulatory Visit: Payer: Medicare Other | Admitting: Physical Therapy

## 2014-07-18 DIAGNOSIS — IMO0001 Reserved for inherently not codable concepts without codable children: Secondary | ICD-10-CM | POA: Diagnosis not present

## 2014-07-20 ENCOUNTER — Ambulatory Visit: Payer: Medicare Other | Admitting: Physical Therapy

## 2014-07-20 DIAGNOSIS — IMO0001 Reserved for inherently not codable concepts without codable children: Secondary | ICD-10-CM | POA: Diagnosis not present

## 2014-07-25 ENCOUNTER — Ambulatory Visit: Payer: Medicare Other | Attending: Orthopaedic Surgery | Admitting: Physical Therapy

## 2014-07-25 DIAGNOSIS — IMO0001 Reserved for inherently not codable concepts without codable children: Secondary | ICD-10-CM | POA: Insufficient documentation

## 2014-07-25 DIAGNOSIS — Z96659 Presence of unspecified artificial knee joint: Secondary | ICD-10-CM | POA: Insufficient documentation

## 2014-07-25 DIAGNOSIS — R5381 Other malaise: Secondary | ICD-10-CM | POA: Insufficient documentation

## 2014-07-25 DIAGNOSIS — M25519 Pain in unspecified shoulder: Secondary | ICD-10-CM | POA: Diagnosis not present

## 2014-07-25 DIAGNOSIS — Z9889 Other specified postprocedural states: Secondary | ICD-10-CM | POA: Diagnosis not present

## 2014-07-25 DIAGNOSIS — M25619 Stiffness of unspecified shoulder, not elsewhere classified: Secondary | ICD-10-CM | POA: Insufficient documentation

## 2014-07-27 ENCOUNTER — Ambulatory Visit: Payer: Medicare Other | Admitting: Physical Therapy

## 2014-07-27 DIAGNOSIS — IMO0001 Reserved for inherently not codable concepts without codable children: Secondary | ICD-10-CM | POA: Diagnosis not present

## 2014-08-01 ENCOUNTER — Ambulatory Visit: Payer: Medicare Other | Admitting: Physical Therapy

## 2014-08-01 DIAGNOSIS — IMO0001 Reserved for inherently not codable concepts without codable children: Secondary | ICD-10-CM | POA: Diagnosis not present

## 2014-08-08 ENCOUNTER — Ambulatory Visit: Payer: Medicare Other | Admitting: Physical Therapy

## 2014-08-08 DIAGNOSIS — IMO0001 Reserved for inherently not codable concepts without codable children: Secondary | ICD-10-CM | POA: Diagnosis not present

## 2014-08-15 ENCOUNTER — Ambulatory Visit: Payer: Medicare Other | Admitting: Physical Therapy

## 2014-08-15 ENCOUNTER — Encounter: Payer: Medicare Other | Admitting: Physical Therapy

## 2014-08-15 DIAGNOSIS — IMO0001 Reserved for inherently not codable concepts without codable children: Secondary | ICD-10-CM | POA: Diagnosis not present

## 2014-08-22 ENCOUNTER — Ambulatory Visit: Payer: Medicare Other | Attending: Orthopaedic Surgery | Admitting: Physical Therapy

## 2014-08-22 DIAGNOSIS — M25619 Stiffness of unspecified shoulder, not elsewhere classified: Secondary | ICD-10-CM | POA: Diagnosis not present

## 2014-08-22 DIAGNOSIS — M25519 Pain in unspecified shoulder: Secondary | ICD-10-CM | POA: Diagnosis not present

## 2014-08-22 DIAGNOSIS — R5381 Other malaise: Secondary | ICD-10-CM | POA: Insufficient documentation

## 2014-08-22 DIAGNOSIS — IMO0001 Reserved for inherently not codable concepts without codable children: Secondary | ICD-10-CM | POA: Diagnosis not present

## 2014-08-22 DIAGNOSIS — Z96659 Presence of unspecified artificial knee joint: Secondary | ICD-10-CM | POA: Insufficient documentation

## 2014-08-22 DIAGNOSIS — Z9889 Other specified postprocedural states: Secondary | ICD-10-CM | POA: Diagnosis not present

## 2014-08-29 ENCOUNTER — Ambulatory Visit: Payer: Medicare Other | Admitting: Physical Therapy

## 2014-08-29 ENCOUNTER — Ambulatory Visit: Payer: Medicare Other | Admitting: Family Medicine

## 2014-08-29 DIAGNOSIS — IMO0001 Reserved for inherently not codable concepts without codable children: Secondary | ICD-10-CM | POA: Diagnosis not present

## 2014-09-01 ENCOUNTER — Ambulatory Visit (INDEPENDENT_AMBULATORY_CARE_PROVIDER_SITE_OTHER): Payer: Medicare Other | Admitting: Family Medicine

## 2014-09-01 ENCOUNTER — Encounter: Payer: Self-pay | Admitting: Family Medicine

## 2014-09-01 VITALS — BP 134/83 | HR 82 | Temp 96.8°F | Ht 60.0 in | Wt 131.0 lb

## 2014-09-01 DIAGNOSIS — H6120 Impacted cerumen, unspecified ear: Secondary | ICD-10-CM

## 2014-09-01 DIAGNOSIS — E559 Vitamin D deficiency, unspecified: Secondary | ICD-10-CM

## 2014-09-01 DIAGNOSIS — E785 Hyperlipidemia, unspecified: Secondary | ICD-10-CM

## 2014-09-01 DIAGNOSIS — I10 Essential (primary) hypertension: Secondary | ICD-10-CM

## 2014-09-01 DIAGNOSIS — S42412S Displaced simple supracondylar fracture without intercondylar fracture of left humerus, sequela: Secondary | ICD-10-CM

## 2014-09-01 DIAGNOSIS — H6123 Impacted cerumen, bilateral: Secondary | ICD-10-CM

## 2014-09-01 LAB — POCT CBC
Granulocyte percent: 64.1 %G (ref 37–80)
HCT, POC: 41.3 % (ref 37.7–47.9)
HEMOGLOBIN: 13.1 g/dL (ref 12.2–16.2)
Lymph, poc: 1.6 (ref 0.6–3.4)
MCH, POC: 29.3 pg (ref 27–31.2)
MCHC: 31.8 g/dL (ref 31.8–35.4)
MCV: 92.2 fL (ref 80–97)
MPV: 8.1 fL (ref 0–99.8)
POC Granulocyte: 3.9 (ref 2–6.9)
POC LYMPH PERCENT: 27.7 %L (ref 10–50)
Platelet Count, POC: 222 10*3/uL (ref 142–424)
RBC: 4.5 M/uL (ref 4.04–5.48)
RDW, POC: 13.6 %
WBC: 5.9 10*3/uL (ref 4.6–10.2)

## 2014-09-01 NOTE — Patient Instructions (Signed)
Medicare Annual Wellness Visit  [facility] and the medical providers at [facility] strive to bring you the best medical care.  In doing so we not only want to address your current medical conditions and concerns but also to detect new conditions early and prevent illness, disease and health-related problems.    Medicare offers a yearly Wellness Visit which allows our clinical staff to assess your need for preventative services including immunizations, lifestyle education, counseling to decrease risk of preventable diseases and screening for fall risk and other medical concerns.    This visit is provided free of charge (no copay) for all Medicare recipients. The clinical pharmacists at [facility] have begun to conduct these Wellness Visits which will also include a thorough review of all your medications.    As you primary medical provider recommend that you make an appointment for your Annual Wellness Visit if you have not done so already this year.  You may set up this appointment before you leave today or you may call back ([phone]) and schedule an appointment.  Please make sure when you call that you mention that you are scheduling your Annual Wellness Visit with the clinical pharmacist so that the appointment may be made for the proper length of time.     Continue current medications. Continue good therapeutic lifestyle changes which include good diet and exercise. Fall precautions discussed with patient. If an FOBT was given today- please return it to our front desk. If you are over 50 years old - you may need Prevnar 13 or the adult Pneumonia vaccine.  Flu Shots will be available at our office starting mid- September. Please call and schedule a FLU CLINIC APPOINTMENT.    

## 2014-09-01 NOTE — Progress Notes (Signed)
Subjective:    Patient ID: Melissa Hall, female    DOB: 1928/07/27, 80 y.o.   MRN: [id number]  HPI Pt here for follow up and management of chronic medical problems. It is of significance that the patient is recovering from a left humerus fracture requiring surgery. She continues to get physical therapy and is recovering well for her age. She is due to get lab work today and will be given and FOBT to return. She is alert and cooperative and in good spirits.        Patient Active Problem List   Diagnosis Date Noted  . Fracture of proximal end of left humerus 05/13/2014  . Traumatic closed displaced fracture of proximal end of left humerus 05/12/2014    Class: Acute  . Proximal humerus fracture 05/12/2014  . Osteoporosis, post-menopausal 01/25/2014  . Hyperlipidemia 12/14/2013  . Hypertension 12/14/2013  . Vitamin D deficiency 12/14/2013  . Scoliosis (and kyphoscoliosis), idiopathic 12/14/2013   Outpatient Encounter Prescriptions as of 09/01/2014  Medication Sig  . aspirin EC 81 MG tablet Take 81 mg by mouth daily.  . calcium citrate-vitamin D (CITRACAL+D) 315-200 MG-UNIT per tablet Take 1 tablet by mouth 3 (three) times daily.  . cholecalciferol (VITAMIN D) 1000 UNITS tablet Take 3,000 Units by mouth daily.   . hydrochlorothiazide (MICROZIDE) 12.5 MG capsule TAKE (1) CAPSULE DAILY  . Multiple Vitamins-Minerals (MULTIVITAMIN WITH MINERALS) tablet Take 1 tablet by mouth daily.  . pravastatin (PRAVACHOL) 10 MG tablet TAKE 1 TABLET DAILY  . [DISCONTINUED] hydrochlorothiazide (MICROZIDE) 12.5 MG capsule Take 12.5 mg by mouth daily.  . [DISCONTINUED] oxyCODONE-acetaminophen (ROXICET) 5-325 MG per tablet Take 1-2 tablets by mouth every 4 (four) hours as needed.  . [DISCONTINUED] pravastatin (PRAVACHOL) 10 MG tablet Take 10 mg by mouth daily.    Review of Systems  Constitutional: Negative.   HENT: Negative.   Eyes: Negative.   Respiratory: Negative.   Cardiovascular: Negative.     Gastrointestinal: Negative.   Endocrine: Negative.   Genitourinary: Negative.   Musculoskeletal: Negative.   Skin: Negative.   Allergic/Immunologic: Negative.   Neurological: Negative.   Hematological: Negative.   Psychiatric/Behavioral: Negative.        Objective:   Physical Exam  Nursing note and vitals reviewed. Constitutional: She is oriented to person, place, and time. No distress.  Elderly appearing but alert and pleasant  HENT:  Head: Normocephalic and atraumatic.  Nose: Nose normal.  Mouth/Throat: Oropharynx is clear and moist.  Bilateral cerumen impaction  Eyes: Conjunctivae and EOM are normal. Pupils are equal, round, and reactive to light. Right eye exhibits no discharge. Left eye exhibits no discharge. No scleral icterus.  Neck: Normal range of motion. Neck supple. No thyromegaly present.  No carotid bruits  Cardiovascular: Normal rate, regular rhythm, normal heart sounds and intact distal pulses.   No murmur heard. At 72 per minute  Pulmonary/Chest: Effort normal and breath sounds normal. No respiratory distress. She has no wheezes. She has no rales. She exhibits no tenderness.  Abdominal: Soft. Bowel sounds are normal. She exhibits no mass. There is no tenderness. There is no rebound and no guarding.  Genitourinary:  Both breasts were checked and there were no large or masses. There was no axillary adenopathy.  Musculoskeletal: She exhibits no edema and no tenderness.  There was limited range of motion of the left shoulder with limited ability to abduct secondary to pain. The patient tells me that this range of motion is improving. She has been doing  physical therapy for about 17 weeks.  Lymphadenopathy:    She has no cervical adenopathy.  Neurological: She is alert and oriented to person, place, and time. She has normal reflexes. No cranial nerve deficit.  Skin: Skin is warm and dry. No rash noted.  Psychiatric: She has a normal mood and affect. Her behavior is  normal. Judgment and thought content normal.   BP 134/83  Pulse 82  Temp(Src) 96.8 F (36 C) (Oral)  Ht 5' (1.524 m)  Wt 131 lb (59.421 kg)  BMI 25.58 kg/m2        Assessment & Plan:  1. Hyperlipidemia - POCT CBC - Lipid panel  2. Essential hypertension - POCT CBC - BMP8+EGFR - Hepatic function panel  3. Vitamin D deficiency - POCT CBC - Vit D  25 hydroxy (rtn osteoporosis monitoring)  4. Left supracondylar humerus fracture, sequela -Continue with physical therapy and followup with orthopedist  5. Excessive cerumen in ear canal, bilateral -Ear irrigation today  Patient Instructions                       Medicare Annual Wellness Visit  [facility] and the medical providers at [facility] strive to bring you the best medical care.  In doing so we not only want to address your current medical conditions and concerns but also to detect new conditions early and prevent illness, disease and health-related problems.    Medicare offers a yearly Wellness Visit which allows our clinical staff to assess your need for preventative services including immunizations, lifestyle education, counseling to decrease risk of preventable diseases and screening for fall risk and other medical concerns.    This visit is provided free of charge (no copay) for all Medicare recipients. The clinical pharmacists at [facility] have begun to conduct these Wellness Visits which will also include a thorough review of all your medications.    As you primary medical provider recommend that you make an appointment for your Annual Wellness Visit if you have not done so already this year.  You may set up this appointment before you leave today or you may call back ([phone]) and schedule an appointment.  Please make sure when you call that you mention that you are scheduling your Annual Wellness Visit with the clinical pharmacist so that the appointment may be made  for the proper length of time.     Continue current medications. Continue good therapeutic lifestyle changes which include good diet and exercise. Fall precautions discussed with patient. If an FOBT was given today- please return it to our front desk. If you are over 19 years old - you may need Prevnar 51 or the adult Pneumonia vaccine.  Flu Shots will be available at our office starting mid- September. Please call and schedule a FLU CLINIC APPOINTMENT.      Arrie Senate MD

## 2014-09-02 LAB — BMP8+EGFR
BUN/Creatinine Ratio: 14 (ref 11–26)
BUN: 8 mg/dL (ref 8–27)
CO2: 26 mmol/L (ref 18–29)
Calcium: 9.7 mg/dL (ref 8.7–10.3)
Chloride: 98 mmol/L (ref 97–108)
Creatinine, Ser: 0.57 mg/dL (ref 0.57–1.00)
GFR calc Af Amer: 99 mL/min/{1.73_m2} (ref >59)
GFR calc non Af Amer: 86 mL/min/{1.73_m2} (ref >59)
Glucose: 90 mg/dL (ref 65–99)
POTASSIUM: 3.9 mmol/L (ref 3.5–5.2)
SODIUM: 141 mmol/L (ref 134–144)

## 2014-09-02 LAB — HEPATIC FUNCTION PANEL
ALK PHOS: 72 IU/L (ref 39–117)
ALT: 20 IU/L (ref 0–32)
AST: 27 IU/L (ref 0–40)
Albumin: 4.4 g/dL (ref 3.5–4.7)
Bilirubin, Direct: 0.14 mg/dL (ref 0.00–0.40)
Total Bilirubin: 0.5 mg/dL (ref 0.0–1.2)
Total Protein: 6.6 g/dL (ref 6.0–8.5)

## 2014-09-02 LAB — VITAMIN D 25 HYDROXY (VIT D DEFICIENCY, FRACTURES): Vit D, 25-Hydroxy: 59.1 ng/mL (ref 30.0–100.0)

## 2014-09-02 LAB — LIPID PANEL
CHOLESTEROL TOTAL: 190 mg/dL (ref 100–199)
Chol/HDL Ratio: 2.5 ratio units (ref 0.0–4.4)
HDL: 75 mg/dL (ref >39)
LDL CALC: 99 mg/dL (ref 0–99)
Triglycerides: 79 mg/dL (ref 0–149)
VLDL Cholesterol Cal: 16 mg/dL (ref 5–40)

## 2014-09-06 ENCOUNTER — Telehealth: Payer: Self-pay | Admitting: Family Medicine

## 2014-09-06 NOTE — Telephone Encounter (Signed)
Pt aware of lab results.  rs °

## 2014-09-06 NOTE — Telephone Encounter (Signed)
Message copied by Doreatha Massed on Wed Sep 06, 2014  9:46 AM ------      Message from: Ernestina Penna      Created: Sat Sep 02, 2014 10:37 AM       The blood sugar, renal, and electrolytes including potassium are within normal limit      All liver function tests are within normal limits      Cholesterol numbers with traditional lipid testing are within normal limits--- continue cholesterol medication and as aggressive therapeutic lifestyle changes as possible including diet and exercise      The vitamin D level is good and within normal limits, continue current treatment ------

## 2014-09-07 ENCOUNTER — Ambulatory Visit: Payer: Medicare Other | Admitting: Family Medicine

## 2014-09-11 ENCOUNTER — Encounter: Payer: Self-pay | Admitting: *Deleted

## 2014-10-25 ENCOUNTER — Other Ambulatory Visit: Payer: Self-pay | Admitting: Family Medicine

## 2014-11-01 ENCOUNTER — Other Ambulatory Visit: Payer: Self-pay | Admitting: Pharmacist

## 2014-11-01 DIAGNOSIS — M8080XD Other osteoporosis with current pathological fracture, unspecified site, subsequent encounter for fracture with routine healing: Secondary | ICD-10-CM

## 2014-11-18 ENCOUNTER — Other Ambulatory Visit: Payer: Self-pay | Admitting: Family Medicine

## 2015-01-02 ENCOUNTER — Ambulatory Visit (INDEPENDENT_AMBULATORY_CARE_PROVIDER_SITE_OTHER): Payer: Medicare Other | Admitting: Family Medicine

## 2015-01-02 ENCOUNTER — Encounter: Payer: Self-pay | Admitting: Family Medicine

## 2015-01-02 VITALS — BP 131/84 | HR 79 | Temp 97.1°F | Ht 60.0 in | Wt 132.0 lb

## 2015-01-02 DIAGNOSIS — E559 Vitamin D deficiency, unspecified: Secondary | ICD-10-CM | POA: Diagnosis not present

## 2015-01-02 DIAGNOSIS — S42412S Displaced simple supracondylar fracture without intercondylar fracture of left humerus, sequela: Secondary | ICD-10-CM

## 2015-01-02 DIAGNOSIS — S42415S Nondisplaced simple supracondylar fracture without intercondylar fracture of left humerus, sequela: Secondary | ICD-10-CM

## 2015-01-02 DIAGNOSIS — I10 Essential (primary) hypertension: Secondary | ICD-10-CM

## 2015-01-02 DIAGNOSIS — E785 Hyperlipidemia, unspecified: Secondary | ICD-10-CM

## 2015-01-02 LAB — POCT CBC
Granulocyte percent: 63.5 %G (ref 37–80)
HEMATOCRIT: 40.1 % (ref 37.7–47.9)
HEMOGLOBIN: 12.6 g/dL (ref 12.2–16.2)
Lymph, poc: 1.5 (ref 0.6–3.4)
MCH: 29.7 pg (ref 27–31.2)
MCHC: 31.5 g/dL — AB (ref 31.8–35.4)
MCV: 94.4 fL (ref 80–97)
MPV: 7.9 fL (ref 0–99.8)
POC Granulocyte: 3.2 (ref 2–6.9)
POC LYMPH PERCENT: 30.1 %L (ref 10–50)
Platelet Count, POC: 224 10*3/uL (ref 142–424)
RBC: 4.3 M/uL (ref 4.04–5.48)
RDW, POC: 13 %
WBC: 5 10*3/uL (ref 4.6–10.2)

## 2015-01-02 NOTE — Progress Notes (Signed)
Subjective:    Patient ID: Melissa Hall, female    DOB: 1928/02/17, 81 y.o.   MRN: [id number]  HPI Pt here for follow up and management of chronic medical problems which include hypertension and hyperlipidemia. She is taking medications regularly. The patient recently had a fall and sustained a fracture to the proximal left humerus and she is being followed by the orthopedic surgeon for this. She is still having some pain in this area. She is due to get a mammogram and we will arrange for this to be done rate at a little bit later in the spring. She will be given an FOBT to return and we'll get lab work done today. Her vital signs are stable. The patient is alert and doing well and doing her on physical therapy at home unfortunately in the basement. I reminded her during the conversation she must be very careful about going up and down the steps and she says that she is an only does it with assistance. She is due to see the orthopedic surgeon again soon and she is also due to get a mammogram and this will be scheduled on her in the spring.        Patient Active Problem List   Diagnosis Date Noted  . Fracture of proximal end of left humerus 05/13/2014  . Traumatic closed displaced fracture of proximal end of left humerus 05/12/2014    Class: Acute  . Proximal humerus fracture 05/12/2014  . Osteoporosis, post-menopausal 01/25/2014  . Hyperlipidemia 12/14/2013  . Hypertension 12/14/2013  . Vitamin D deficiency 12/14/2013  . Scoliosis (and kyphoscoliosis), idiopathic 12/14/2013   Outpatient Encounter Prescriptions as of 01/02/2015  Medication Sig  . aspirin EC 81 MG tablet Take 81 mg by mouth daily.  . calcium citrate-vitamin D (CITRACAL+D) 315-200 MG-UNIT per tablet Take 1 tablet by mouth 3 (three) times daily.  . cholecalciferol (VITAMIN D) 1000 UNITS tablet Take 3,000 Units by mouth daily.   . hydrochlorothiazide (MICROZIDE) 12.5 MG capsule TAKE (1) CAPSULE DAILY  . Multiple  Vitamins-Minerals (MULTIVITAMIN WITH MINERALS) tablet Take 1 tablet by mouth daily.  . pravastatin (PRAVACHOL) 10 MG tablet TAKE 1 TABLET DAILY    Review of Systems  Constitutional: Negative.   HENT: Negative.   Eyes: Negative.   Respiratory: Negative.   Cardiovascular: Negative.   Gastrointestinal: Negative.   Endocrine: Negative.   Genitourinary: Negative.   Musculoskeletal: Positive for arthralgias (dr yates - left shoulder pain).  Skin: Negative.   Allergic/Immunologic: Negative.   Neurological: Negative.   Hematological: Negative.   Psychiatric/Behavioral: Negative.        Objective:   Physical Exam  Constitutional: She is oriented to person, place, and time. No distress.  This is a very sweet alert and kyphotic patient.  HENT:  Head: Normocephalic and atraumatic.  Right Ear: External ear normal.  Left Ear: External ear normal.  Nose: Nose normal.  Mouth/Throat: Oropharynx is clear and moist.  Eyes: Conjunctivae and EOM are normal. Pupils are equal, round, and reactive to light. Right eye exhibits no discharge. Left eye exhibits no discharge. No scleral icterus.  Neck: Normal range of motion. Neck supple. No JVD present. No thyromegaly present.  Good mobility no anterior cervical nodes or thyromegaly.  Cardiovascular: Normal rate, regular rhythm, normal heart sounds and intact distal pulses.  Exam reveals no gallop and no friction rub.   No murmur heard. The rhythm is regular at 72/m  Pulmonary/Chest: Effort normal and breath sounds normal. No respiratory  distress. She has no wheezes. She has no rales. She exhibits no tenderness.  Lungs are clear anteriorly and posteriorly  Abdominal: Soft. Bowel sounds are normal. She exhibits no mass. There is no tenderness. There is no rebound and no guarding.  Musculoskeletal: She exhibits tenderness. She exhibits no edema.  The patient has severe thoracic kyphosis. She is still tender in the left proximal humerus area and her range  of motion according to her is improving with her physical therapy at home.  Lymphadenopathy:    She has no cervical adenopathy.  Neurological: She is alert and oriented to person, place, and time. She has normal reflexes. No cranial nerve deficit.  Skin: Skin is warm and dry. No rash noted.  Psychiatric: She has a normal mood and affect. Her behavior is normal. Judgment and thought content normal.  Nursing note and vitals reviewed.  BP 131/84 mmHg  Pulse 79  Temp(Src) 97.1 F (36.2 C) (Oral)  Ht 5' (1.524 m)  Wt 132 lb (59.875 kg)  BMI 25.78 kg/m2        Assessment & Plan:  1. Hyperlipidemia -Continue with pravastatin until recent lab work is returned and we will discuss this treatment  with those results - POCT CBC - Hepatic function panel - Lipid panel; Future  2. Essential hypertension -Blood pressure is good today, continue with hydrochlorothiazide - POCT CBC - BMP8+EGFR - Hepatic function panel  3. Vitamin D deficiency -Continue with 3000 units of vitamin D daily and walking exercise as much as possible - POCT CBC - Vit D  25 hydroxy (rtn osteoporosis monitoring)  4. Left supracondylar humerus fracture, sequela -Continue physical therapy per direction of orthopedic surgeon  Patient Instructions                       Medicare Annual Wellness Visit  [facility] and the medical providers at [facility] strive to bring you the best medical care.  In doing so we not only want to address your current medical conditions and concerns but also to detect new conditions early and prevent illness, disease and health-related problems.    Medicare offers a yearly Wellness Visit which allows our clinical staff to assess your need for preventative services including immunizations, lifestyle education, counseling to decrease risk of preventable diseases and screening for fall risk and other medical concerns.    This visit is provided free of charge (no  copay) for all Medicare recipients. The clinical pharmacists at [facility] have begun to conduct these Wellness Visits which will also include a thorough review of all your medications.    As you primary medical provider recommend that you make an appointment for your Annual Wellness Visit if you have not done so already this year.  You may set up this appointment before you leave today or you may call back ([phone]) and schedule an appointment.  Please make sure when you call that you mention that you are scheduling your Annual Wellness Visit with the clinical pharmacist so that the appointment may be made for the proper length of time.     Continue current medications. Continue good therapeutic lifestyle changes which include good diet and exercise. Fall precautions discussed with patient. If an FOBT was given today- please return it to our front desk. If you are over 6 years old - you may need Prevnar 51 or the adult Pneumonia vaccine.  Flu Shots will be available at our office starting  mid- September. Please call and schedule a FLU CLINIC APPOINTMENT.   Continue to be careful and did not put yourself at risk for falling return the FOBT we will call you with the lab work results once those results are available Keep follow-up appointment with Dr. Lorin Mercy Continue physical therapy at home as he has recommended     Arrie Senate MD

## 2015-01-02 NOTE — Patient Instructions (Addendum)
Medicare Annual Wellness Visit   and the medical providers at [facility] strive to bring you the best medical care.  In doing so we not only want to address your current medical conditions and concerns but also to detect new conditions early and prevent illness, disease and health-related problems.    Medicare offers a yearly Wellness Visit which allows our clinical staff to assess your need for preventative services including immunizations, lifestyle education, counseling to decrease risk of preventable diseases and screening for fall risk and other medical concerns.    This visit is provided free of charge (no copay) for all Medicare recipients. The clinical pharmacists at [facility] have begun to conduct these Wellness Visits which will also include a thorough review of all your medications.    As you primary medical provider recommend that you make an appointment for your Annual Wellness Visit if you have not done so already this year.  You may set up this appointment before you leave today or you may call back [phone] and schedule an appointment.  Please make sure when you call that you mention that you are scheduling your Annual Wellness Visit with the clinical pharmacist so that the appointment may be made for the proper length of time.     Continue current medications. Continue good therapeutic lifestyle changes which include good diet and exercise. Fall precautions discussed with patient. If an FOBT was given today- please return it to our front desk. If you are over 79 years old - you may need Prevnar 13 or the adult Pneumonia vaccine.  Flu Shots will be available at our office starting mid- September. Please call and schedule a FLU CLINIC APPOINTMENT.   Continue to be careful and did not put yourself at risk for falling return the FOBT we will call you with the lab work results once those results are  available Keep follow-up appointment with Dr. Ophelia CharterYates Continue physical therapy at home as he has recommended

## 2015-01-03 LAB — BMP8+EGFR
BUN/Creatinine Ratio: 14 (ref 11–26)
BUN: 9 mg/dL (ref 8–27)
CO2: 28 mmol/L (ref 18–29)
Calcium: 9.8 mg/dL (ref 8.7–10.3)
Chloride: 101 mmol/L (ref 97–108)
Creatinine, Ser: 0.64 mg/dL (ref 0.57–1.00)
GFR calc non Af Amer: 82 mL/min/{1.73_m2} (ref >59)
GFR, EST AFRICAN AMERICAN: 95 mL/min/{1.73_m2} (ref >59)
Glucose: 97 mg/dL (ref 65–99)
POTASSIUM: 4.3 mmol/L (ref 3.5–5.2)
Sodium: 144 mmol/L (ref 134–144)

## 2015-01-03 LAB — HEPATIC FUNCTION PANEL
ALK PHOS: 70 IU/L (ref 39–117)
ALT: 20 IU/L (ref 0–32)
AST: 29 IU/L (ref 0–40)
Albumin: 4.2 g/dL (ref 3.5–4.7)
BILIRUBIN DIRECT: 0.17 mg/dL (ref 0.00–0.40)
Total Bilirubin: 0.6 mg/dL (ref 0.0–1.2)
Total Protein: 6.2 g/dL (ref 6.0–8.5)

## 2015-01-03 LAB — VITAMIN D 25 HYDROXY (VIT D DEFICIENCY, FRACTURES): VIT D 25 HYDROXY: 48.2 ng/mL (ref 30.0–100.0)

## 2015-01-11 DIAGNOSIS — H43393 Other vitreous opacities, bilateral: Secondary | ICD-10-CM | POA: Diagnosis not present

## 2015-01-11 DIAGNOSIS — H40033 Anatomical narrow angle, bilateral: Secondary | ICD-10-CM | POA: Diagnosis not present

## 2015-01-25 ENCOUNTER — Encounter: Payer: Self-pay | Admitting: *Deleted

## 2015-02-07 ENCOUNTER — Ambulatory Visit (INDEPENDENT_AMBULATORY_CARE_PROVIDER_SITE_OTHER): Payer: Medicare Other | Admitting: Pharmacist

## 2015-02-07 ENCOUNTER — Ambulatory Visit: Payer: Medicare Other

## 2015-02-07 DIAGNOSIS — M810 Age-related osteoporosis without current pathological fracture: Secondary | ICD-10-CM

## 2015-02-07 NOTE — Progress Notes (Signed)
Patient ID: Melissa Hall, female   DOB: [birth year], 81 y.o.   MRN: [id number]  Patient was not seen today - too early for repeat BMD

## 2015-02-21 ENCOUNTER — Telehealth: Payer: Self-pay | Admitting: Pharmacist

## 2015-02-21 NOTE — Telephone Encounter (Signed)
Tried to call patient to discuss treatment for osteoporosis.   No Answer so left message to call office.

## 2015-02-26 ENCOUNTER — Other Ambulatory Visit: Payer: Self-pay | Admitting: Nurse Practitioner

## 2015-02-26 MED ORDER — PRAVASTATIN SODIUM 10 MG PO TABS: 10.0000 mg | ORAL_TABLET | Freq: Every day | ORAL | Status: DC

## 2015-02-26 MED ORDER — ALENDRONATE SODIUM 70 MG PO TABS: 70.0000 mg | ORAL_TABLET | ORAL | Status: DC

## 2015-02-26 NOTE — Telephone Encounter (Signed)
Patient called regarding treatment for osteoporosis.  She did not start alendronate.  She fractured shoulder 04/2014.  Discussed future fracture risk and patient agreed to start alendronate 70mg  1 tablet q week.  Reviewed administration of alendronate with patient.

## 2015-03-19 ENCOUNTER — Other Ambulatory Visit: Payer: Self-pay | Admitting: Family Medicine

## 2015-05-09 ENCOUNTER — Ambulatory Visit: Payer: Medicare Other | Admitting: Family Medicine

## 2015-05-11 ENCOUNTER — Ambulatory Visit: Payer: Medicare Other | Admitting: Family Medicine

## 2015-05-14 ENCOUNTER — Encounter: Payer: Self-pay | Admitting: Family Medicine

## 2015-05-14 ENCOUNTER — Ambulatory Visit (INDEPENDENT_AMBULATORY_CARE_PROVIDER_SITE_OTHER): Payer: Medicare Other | Admitting: Family Medicine

## 2015-05-14 VITALS — BP 139/76 | HR 67 | Temp 97.0°F | Ht 60.0 in | Wt 137.0 lb

## 2015-05-14 DIAGNOSIS — I10 Essential (primary) hypertension: Secondary | ICD-10-CM | POA: Diagnosis not present

## 2015-05-14 DIAGNOSIS — E785 Hyperlipidemia, unspecified: Secondary | ICD-10-CM

## 2015-05-14 DIAGNOSIS — S42412S Displaced simple supracondylar fracture without intercondylar fracture of left humerus, sequela: Secondary | ICD-10-CM

## 2015-05-14 DIAGNOSIS — E559 Vitamin D deficiency, unspecified: Secondary | ICD-10-CM

## 2015-05-14 DIAGNOSIS — S42415S Nondisplaced simple supracondylar fracture without intercondylar fracture of left humerus, sequela: Secondary | ICD-10-CM

## 2015-05-14 LAB — POCT CBC
Granulocyte percent: 62.8 %G (ref 37–80)
HEMATOCRIT: 40.3 % (ref 37.7–47.9)
HEMOGLOBIN: 12.9 g/dL (ref 12.2–16.2)
LYMPH, POC: 1.7 (ref 0.6–3.4)
MCH: 30.1 pg (ref 27–31.2)
MCHC: 32.1 g/dL (ref 31.8–35.4)
MCV: 94 fL (ref 80–97)
MPV: 8.6 fL (ref 0–99.8)
PLATELET COUNT, POC: 202 10*3/uL (ref 142–424)
POC Granulocyte: 3.5 (ref 2–6.9)
POC LYMPH %: 30.2 % (ref 10–50)
RBC: 4.29 M/uL (ref 4.04–5.48)
RDW, POC: 12.7 %
WBC: 5.6 10*3/uL (ref 4.6–10.2)

## 2015-05-14 NOTE — Progress Notes (Signed)
Subjective:    Patient ID: Melissa Hall, female    DOB: 04-04-1928, 81 y.o.   MRN: [id number]  HPI Pt here for follow up and management of chronic medical problems which includes hypertension and hyperlipidemia. She is taking medications regularly. The patient is still seeing the orthopedic surgeon regarding her left shoulder fracture. She is due to return an FOBT and to get her lab work. She also needs to schedule for mammogram.       Patient Active Problem List   Diagnosis Date Noted  . Fracture of proximal end of left humerus 05/13/2014  . Traumatic closed displaced fracture of proximal end of left humerus 05/12/2014    Class: Acute  . Proximal humerus fracture 05/12/2014  . Osteoporosis, post-menopausal 01/25/2014  . Hyperlipidemia 12/14/2013  . Hypertension 12/14/2013  . Vitamin D deficiency 12/14/2013  . Scoliosis (and kyphoscoliosis), idiopathic 12/14/2013   Outpatient Encounter Prescriptions as of 05/14/2015  Medication Sig  . alendronate (FOSAMAX) 70 MG tablet Take 1 tablet (70 mg total) by mouth every 7 (seven) days. Take with a full glass of water on an empty stomach.  Marland Kitchen aspirin EC 81 MG tablet Take 81 mg by mouth daily.  . calcium citrate-vitamin D (CITRACAL+D) 315-200 MG-UNIT per tablet Take 1 tablet by mouth 3 (three) times daily.  . cholecalciferol (VITAMIN D) 1000 UNITS tablet Take 3,000 Units by mouth daily.   . hydrochlorothiazide (MICROZIDE) 12.5 MG capsule TAKE (1) CAPSULE DAILY  . Multiple Vitamins-Minerals (MULTIVITAMIN WITH MINERALS) tablet Take 1 tablet by mouth daily.  . pravastatin (PRAVACHOL) 10 MG tablet Take 1 tablet (10 mg total) by mouth daily.   No facility-administered encounter medications on file as of 05/14/2015.     Review of Systems  Constitutional: Negative.   HENT: Negative.   Eyes: Negative.   Respiratory: Negative.   Cardiovascular: Negative.   Gastrointestinal: Negative.   Endocrine: Negative.   Genitourinary: Negative.     Musculoskeletal: Positive for arthralgias (left shoulder pain - on going - sees Dr Lorin Mercy).  Skin: Negative.   Allergic/Immunologic: Negative.   Neurological: Negative.   Hematological: Negative.   Psychiatric/Behavioral: Negative.        Objective:   Physical Exam  Constitutional: She is oriented to person, place, and time. No distress.  The patient is elderly appearing but alert and calm and continue to have problems with her left shoulder.  HENT:  Head: Normocephalic and atraumatic.  Right Ear: External ear normal.  Left Ear: External ear normal.  Nose: Nose normal.  Mouth/Throat: Oropharynx is clear and moist. No oropharyngeal exudate.  Eyes: Conjunctivae and EOM are normal. Pupils are equal, round, and reactive to light. Right eye exhibits no discharge. Left eye exhibits no discharge. No scleral icterus.  Neck: Normal range of motion. Neck supple. No thyromegaly present.  No carotid bruits anterior cervical adenopathy or thyromegaly  Cardiovascular: Normal rate, regular rhythm and normal heart sounds.   No murmur heard. At 72/m  Pulmonary/Chest: Effort normal and breath sounds normal. No respiratory distress. She has no wheezes. She has no rales. She exhibits no tenderness.  Clear anteriorly and posteriorly  Abdominal: Soft. Bowel sounds are normal. She exhibits no mass. There is no tenderness. There is no rebound and no guarding.  Genitourinary:  Breast exam today was negative without lumps or masses and no axillary adenopathy  Musculoskeletal: She exhibits tenderness. She exhibits no edema.  The patient continues to have limited range of motion of left shoulder and because of  her posture has a slightly unstable gait  Lymphadenopathy:    She has no cervical adenopathy.  Neurological: She is alert and oriented to person, place, and time. She has normal reflexes. No cranial nerve deficit.  Skin: Skin is warm and dry. No rash noted.  Psychiatric: She has a normal mood and  affect. Her behavior is normal. Judgment and thought content normal.  Nursing note and vitals reviewed.   BP 139/76 mmHg  Pulse 67  Temp(Src) 97 F (36.1 C) (Oral)  Ht 5' (1.524 m)  Wt 137 lb (62.143 kg)  BMI 26.76 kg/m2       Assessment & Plan:  1. Hyperlipidemia -Continue current treatment pending results of lab work - POCT CBC - Lipid panel  2. Essential hypertension -The blood pressure is good today and she should continue with her current treatment - POCT CBC - BMP8+EGFR - Hepatic function panel  3. Vitamin D deficiency -Continue with current treatment pending results of lab work - POCT CBC - Vit D  25 hydroxy (rtn osteoporosis monitoring)  4. Left supracondylar humerus fracture, sequela -Continue follow-up with orthopedic surgeon  Patient Instructions                       Medicare Annual Wellness Visit  [facility] and the medical providers at [facility] strive to bring you the best medical care.  In doing so we not only want to address your current medical conditions and concerns but also to detect new conditions early and prevent illness, disease and health-related problems.    Medicare offers a yearly Wellness Visit which allows our clinical staff to assess your need for preventative services including immunizations, lifestyle education, counseling to decrease risk of preventable diseases and screening for fall risk and other medical concerns.    This visit is provided free of charge (no copay) for all Medicare recipients. The clinical pharmacists at [facility] have begun to conduct these Wellness Visits which will also include a thorough review of all your medications.    As you primary medical provider recommend that you make an appointment for your Annual Wellness Visit if you have not done so already this year.  You may set up this appointment before you leave today or you may call back ([phone]) and schedule  an appointment.  Please make sure when you call that you mention that you are scheduling your Annual Wellness Visit with the clinical pharmacist so that the appointment may be made for the proper length of time.     Continue current medications. Continue good therapeutic lifestyle changes which include good diet and exercise. Fall precautions discussed with patient. If an FOBT was given today- please return it to our front desk. If you are over 16 years old - you may need Prevnar 41 or the adult Pneumonia vaccine.  Flu Shots are still available at our office. If you still haven't had one please call to set up a nurse visit to get one.   After your visit with [place] today you will receive a survey in the mail or online from [employer] regarding your care with [place]. Please take a moment to fill this out. Your feedback is very important to [place] as you can help [place] better understand your patient needs as well as improve your experience and satisfaction. WE CARE ABOUT YOU!!!   The patient should continue follow-up with orthopedics She should set up an appointment to have  her mammogram done and if they cannot do the left side due to irritating her shoulder than at least to the right breast this time. She should continue to be careful and not put yourself at risk for falling. She should continue to drink plenty of fluids   Arrie Senate MD

## 2015-05-14 NOTE — Patient Instructions (Addendum)
Medicare Annual Wellness Visit  [facility] and the medical providers at [facility] strive to bring you the best medical care.  In doing so we not only want to address your current medical conditions and concerns but also to detect new conditions early and prevent illness, disease and health-related problems.    Medicare offers a yearly Wellness Visit which allows our clinical staff to assess your need for preventative services including immunizations, lifestyle education, counseling to decrease risk of preventable diseases and screening for fall risk and other medical concerns.    This visit is provided free of charge (no copay) for all Medicare recipients. The clinical pharmacists at [facility] have begun to conduct these Wellness Visits which will also include a thorough review of all your medications.    As you primary medical provider recommend that you make an appointment for your Annual Wellness Visit if you have not done so already this year.  You may set up this appointment before you leave today or you may call back [phone] and schedule an appointment.  Please make sure when you call that you mention that you are scheduling your Annual Wellness Visit with the clinical pharmacist so that the appointment may be made for the proper length of time.     Continue current medications. Continue good therapeutic lifestyle changes which include good diet and exercise. Fall precautions discussed with patient. If an FOBT was given today- please return it to our front desk. If you are over 544 years old - you may need Prevnar 13 or the adult Pneumonia vaccine.  Flu Shots are still available at our office. If you still haven't had one please call to set up a nurse visit to get one.   After your visit with us today you will receive a survey in the mail or online from [employer] regarding your care with us. Please take a moment to  fill this out. Your feedback is very important to us as you can help us better understand your patient needs as well as improve your experience and satisfaction. WE CARE ABOUT YOU!!!   The patient should continue follow-up with orthopedics She should set up an appointment to have her mammogram done and if they cannot do the left side due to irritating her shoulder than at least to the right breast this time. She should continue to be careful and not put yourself at risk for falling. She should continue to drink plenty of fluids

## 2015-05-15 ENCOUNTER — Telehealth: Payer: Self-pay | Admitting: Family Medicine

## 2015-05-15 LAB — BMP8+EGFR
BUN / CREAT RATIO: 16 (ref 11–26)
BUN: 9 mg/dL (ref 8–27)
CALCIUM: 9.9 mg/dL (ref 8.7–10.3)
CHLORIDE: 101 mmol/L (ref 97–108)
CO2: 26 mmol/L (ref 18–29)
Creatinine, Ser: 0.58 mg/dL (ref 0.57–1.00)
GFR calc Af Amer: 98 mL/min/{1.73_m2} (ref >59)
GFR calc non Af Amer: 85 mL/min/{1.73_m2} (ref >59)
Glucose: 92 mg/dL (ref 65–99)
Potassium: 3.9 mmol/L (ref 3.5–5.2)
Sodium: 144 mmol/L (ref 134–144)

## 2015-05-15 LAB — LIPID PANEL
Chol/HDL Ratio: 2.6 ratio units (ref 0.0–4.4)
Cholesterol, Total: 176 mg/dL (ref 100–199)
HDL: 69 mg/dL (ref >39)
LDL Calculated: 94 mg/dL (ref 0–99)
Triglycerides: 64 mg/dL (ref 0–149)
VLDL CHOLESTEROL CAL: 13 mg/dL (ref 5–40)

## 2015-05-15 LAB — HEPATIC FUNCTION PANEL
ALBUMIN: 4.3 g/dL (ref 3.5–4.7)
ALK PHOS: 73 IU/L (ref 39–117)
ALT: 17 IU/L (ref 0–32)
AST: 31 IU/L (ref 0–40)
BILIRUBIN TOTAL: 0.5 mg/dL (ref 0.0–1.2)
BILIRUBIN, DIRECT: 0.13 mg/dL (ref 0.00–0.40)
Total Protein: 6.4 g/dL (ref 6.0–8.5)

## 2015-05-15 LAB — VITAMIN D 25 HYDROXY (VIT D DEFICIENCY, FRACTURES): VIT D 25 HYDROXY: 46.2 ng/mL (ref 30.0–100.0)

## 2015-05-15 NOTE — Telephone Encounter (Signed)
Patient aware of results.

## 2015-06-11 DIAGNOSIS — Z1231 Encounter for screening mammogram for malignant neoplasm of breast: Secondary | ICD-10-CM | POA: Diagnosis not present

## 2015-06-18 ENCOUNTER — Telehealth: Payer: Self-pay | Admitting: Family Medicine

## 2015-06-18 NOTE — Telephone Encounter (Signed)
Checked with our xray department and those results aren't back yet.  We will notify her when they are available.

## 2015-07-03 ENCOUNTER — Other Ambulatory Visit: Payer: Self-pay

## 2015-07-03 ENCOUNTER — Telehealth: Payer: Self-pay | Admitting: Family Medicine

## 2015-07-03 DIAGNOSIS — Z1231 Encounter for screening mammogram for malignant neoplasm of breast: Secondary | ICD-10-CM

## 2015-07-03 DIAGNOSIS — Z803 Family history of malignant neoplasm of breast: Secondary | ICD-10-CM

## 2015-07-03 NOTE — Telephone Encounter (Signed)
Copy of mammo results stated that additional views for the left breast were needed. Pt stated that she has a plate in that left arm and is not able to lift it as high as needed. Left message for [facility] in [place] for them to contact pt to schedule her additional views.

## 2015-07-04 ENCOUNTER — Telehealth: Payer: Self-pay

## 2015-07-04 NOTE — Telephone Encounter (Signed)
Cathy called and LM that patient has a metal plate in her arm.  They called patient and she knows nothing about any appt.

## 2015-07-04 NOTE — Telephone Encounter (Signed)
Referrals / Xray to work on this. They pt was seen in the mobile unit and the report stated that additional views needed to be obtained. I had left a message for [facility] to call pt to make this appt. Pt does not drive and has [place] from [place] and this is where she would like to go back to.

## 2015-07-05 ENCOUNTER — Ambulatory Visit (INDEPENDENT_AMBULATORY_CARE_PROVIDER_SITE_OTHER): Payer: Medicare Other | Admitting: Pharmacist

## 2015-07-05 ENCOUNTER — Encounter: Payer: Self-pay | Admitting: Pharmacist

## 2015-07-05 ENCOUNTER — Ambulatory Visit (INDEPENDENT_AMBULATORY_CARE_PROVIDER_SITE_OTHER): Payer: Medicare Other

## 2015-07-05 VITALS — BP 120/78 | HR 68 | Ht 58.5 in | Wt 134.0 lb

## 2015-07-05 DIAGNOSIS — Z0000 Encounter for general adult medical examination without abnormal findings: Secondary | ICD-10-CM | POA: Diagnosis not present

## 2015-07-05 DIAGNOSIS — M7989 Other specified soft tissue disorders: Secondary | ICD-10-CM | POA: Diagnosis not present

## 2015-07-05 DIAGNOSIS — M79675 Pain in left toe(s): Secondary | ICD-10-CM

## 2015-07-05 DIAGNOSIS — M79672 Pain in left foot: Secondary | ICD-10-CM

## 2015-07-05 NOTE — Patient Instructions (Addendum)
[name] , Thank you for taking time to come for your Medicare Wellness Visit. I appreciate your ongoing commitment to your health goals. Please review the following plan we discussed and let me know if I can assist you in the future.    Remember to return stool test Start chair exercises we discussed on handout given.   This is a list of the screening recommended for you and due dates:  Health Maintenance  Topic Date Due  . Colon Cancer Screening  09/01/2016*  . Flu Shot  07/23/2015  . DEXA scan (bone density measurement)  01/26/2016  . Mammogram  06/18/2016  . Tetanus Vaccine  09/21/2016  . Shingles Vaccine  Completed  . Pneumonia vaccines  Completed  *Topic was postponed. The date shown is not the original due date.     Preventive Care for Adults A healthy lifestyle and preventive care can promote health and wellness. Preventive health guidelines for women include the following key practices.  A routine yearly physical is a good way to check with your health care provider about your health and preventive screening. It is a chance to share any concerns and updates on your health and to receive a thorough exam.  Visit your dentist for a routine exam and preventive care every 6 months. Brush your teeth twice a day and floss once a day. Good oral hygiene prevents tooth decay and gum disease.  The frequency of eye exams is based on your age, health, family medical history, use of contact lenses, and other factors. Follow your health care provider's recommendations for frequency of eye exams.  Eat a healthy diet. Foods like vegetables, fruits, whole grains, low-fat dairy products, and lean protein foods contain the nutrients you need without too many calories. Decrease your intake of foods high in solid fats, added sugars, and salt. Eat the right amount of calories for you.Get information about a proper diet from your health care provider, if necessary.  Regular physical exercise  is one of the most important things you can do for your health. Most adults should get at least 150 minutes of moderate-intensity exercise (any activity that increases your heart rate and causes you to sweat) each week. In addition, most adults need muscle-strengthening exercises on 2 or more days a week.  Maintain a healthy weight. The body mass index (BMI) is a screening tool to identify possible weight problems. It provides an estimate of body fat based on height and weight. Your health care provider can find your BMI and can help you achieve or maintain a healthy weight.For adults 20 years and older:  A BMI below 18.5 is considered underweight.  A BMI of 18.5 to 24.9 is normal.  A BMI of 25 to 29.9 is considered overweight.  A BMI of 30 and above is considered obese.  Maintain normal blood lipids and cholesterol levels by exercising and minimizing your intake of saturated fat. Eat a balanced diet with plenty of fruit and vegetables. Blood tests for lipids and cholesterol should begin at age 54 and be repeated every 5 years. If your lipid or cholesterol levels are high, you are over 50, or you are at high risk for heart disease, you may need your cholesterol levels checked more frequently.Ongoing high lipid and cholesterol levels should be treated with medicines if diet and exercise are not working.  If you smoke, find out from your health care provider how to quit. If you do not use tobacco, do not start.  Lung  cancer screening is recommended for adults aged 65-80 years who are at high risk for developing lung cancer because of a history of smoking. A yearly low-dose CT scan of the lungs is recommended for people who have at least a 30-pack-year history of smoking and are a current smoker or have quit within the past 15 years. A pack year of smoking is smoking an average of 1 pack of cigarettes a day for 1 year (for example: 1 pack a day for 30 years or 2 packs a day for 15 years). Yearly  screening should continue until the smoker has stopped smoking for at least 15 years. Yearly screening should be stopped for people who develop a health problem that would prevent them from having lung cancer treatment.  If you are pregnant, do not drink alcohol. If you are breastfeeding, be very cautious about drinking alcohol. If you are not pregnant and choose to drink alcohol, do not have more than 1 drink per day. One drink is considered to be 12 ounces (355 mL) of beer, 5 ounces (148 mL) of wine, or 1.5 ounces (44 mL) of liquor.  Avoid use of street drugs. Do not share needles with anyone. Ask for help if you need support or instructions about stopping the use of drugs.  High blood pressure causes heart disease and increases the risk of stroke. Your blood pressure should be checked at least every 1 to 2 years. Ongoing high blood pressure should be treated with medicines if weight loss and exercise do not work.  If you are 43-35 years old, ask your health care provider if you should take aspirin to prevent strokes.  Diabetes screening involves taking a blood sample to check your fasting blood sugar level. This should be done once every 3 years, after age 52, if you are within normal weight and without risk factors for diabetes. Testing should be considered at a younger age or be carried out more frequently if you are overweight and have at least 1 risk factor for diabetes.  Breast cancer screening is essential preventive care for women. You should practice "breast self-awareness." This means understanding the normal appearance and feel of your breasts and may include breast self-examination. Any changes detected, no matter how small, should be reported to a health care provider. Women in their 41s and 30s should have a clinical breast exam (CBE) by a health care provider as part of a regular health exam every 1 to 3 years. After age 19, women should have a CBE every year. Starting at age 28, women  should consider having a mammogram (breast X-ray test) every year. Women who have a family history of breast cancer should talk to their health care provider about genetic screening. Women at a high risk of breast cancer should talk to their health care providers about having an MRI and a mammogram every year.  Breast cancer gene (BRCA)-related cancer risk assessment is recommended for women who have family members with BRCA-related cancers. BRCA-related cancers include breast, ovarian, tubal, and peritoneal cancers. Having family members with these cancers may be associated with an increased risk for harmful changes (mutations) in the breast cancer genes BRCA1 and BRCA2. Results of the assessment will determine the need for genetic counseling and BRCA1 and BRCA2 testing.  Routine pelvic exams to screen for cancer are no longer recommended for nonpregnant women who are considered low risk for cancer of the pelvic organs (ovaries, uterus, and vagina) and who do not have symptoms. Ask  your health care provider if a screening pelvic exam is right for you.  If you have had past treatment for cervical cancer or a condition that could lead to cancer, you need Pap tests and screening for cancer for at least 20 years after your treatment. If Pap tests have been discontinued, your risk factors (such as having a new sexual partner) need to be reassessed to determine if screening should be resumed. Some women have medical problems that increase the chance of getting cervical cancer. In these cases, your health care provider may recommend more frequent screening and Pap tests.  The HPV test is an additional test that may be used for cervical cancer screening. The HPV test looks for the virus that can cause the cell changes on the cervix. The cells collected during the Pap test can be tested for HPV. The HPV test could be used to screen women aged 25 years and older, and should be used in women of any age who have  unclear Pap test results. After the age of 13, women should have HPV testing at the same frequency as a Pap test.  Colorectal cancer can be detected and often prevented. Most routine colorectal cancer screening begins at the age of 27 years and continues through age 76 years. However, your health care provider may recommend screening at an earlier age if you have risk factors for colon cancer. On a yearly basis, your health care provider may provide home test kits to check for hidden blood in the stool. Use of a small camera at the end of a tube, to directly examine the colon (sigmoidoscopy or colonoscopy), can detect the earliest forms of colorectal cancer. Talk to your health care provider about this at age 76, when routine screening begins. Direct exam of the colon should be repeated every 5-10 years through age 58 years, unless early forms of pre-cancerous polyps or small growths are found.  People who are at an increased risk for hepatitis B should be screened for this virus. You are considered at high risk for hepatitis B if:  You were born in a country where hepatitis B occurs often. Talk with your health care provider about which countries are considered high risk.  Your parents were born in a high-risk country and you have not received a shot to protect against hepatitis B (hepatitis B vaccine).  You have HIV or AIDS.  You use needles to inject street drugs.  You live with, or have sex with, someone who has hepatitis B.  You get hemodialysis treatment.  You take certain medicines for conditions like cancer, organ transplantation, and autoimmune conditions.  Hepatitis C blood testing is recommended for all people born from [birth year] through 1965 and any individual with known risks for hepatitis C.  Practice safe sex. Use condoms and avoid high-risk sexual practices to reduce the spread of sexually transmitted infections (STIs). STIs include gonorrhea, chlamydia, syphilis, trichomonas,  herpes, HPV, and human immunodeficiency virus (HIV). Herpes, HIV, and HPV are viral illnesses that have no cure. They can result in disability, cancer, and death.  You should be screened for sexually transmitted illnesses (STIs) including gonorrhea and chlamydia if:  You are sexually active and are younger than 24 years.  You are older than 24 years and your health care provider tells you that you are at risk for this type of infection.  Your sexual activity has changed since you were last screened and you are at an increased risk for chlamydia or  gonorrhea. Ask your health care provider if you are at risk.  If you are at risk of being infected with HIV, it is recommended that you take a prescription medicine daily to prevent HIV infection. This is called preexposure prophylaxis (PrEP). You are considered at risk if:  You are a heterosexual woman, are sexually active, and are at increased risk for HIV infection.  You take drugs by injection.  You are sexually active with a partner who has HIV.  Talk with your health care provider about whether you are at high risk of being infected with HIV. If you choose to begin PrEP, you should first be tested for HIV. You should then be tested every 3 months for as long as you are taking PrEP.  Osteoporosis is a disease in which the bones lose minerals and strength with aging. This can result in serious bone fractures or breaks. The risk of osteoporosis can be identified using a bone density scan. Women ages 54 years and over and women at risk for fractures or osteoporosis should discuss screening with their health care providers. Ask your health care provider whether you should take a calcium supplement or vitamin D to reduce the rate of osteoporosis.  Menopause can be associated with physical symptoms and risks. Hormone replacement therapy is available to decrease symptoms and risks. You should talk to your health care provider about whether hormone  replacement therapy is right for you.  Use sunscreen. Apply sunscreen liberally and repeatedly throughout the day. You should seek shade when your shadow is shorter than you. Protect yourself by wearing long sleeves, pants, a wide-brimmed hat, and sunglasses year round, whenever you are outdoors.  Once a month, do a whole body skin exam, using a mirror to look at the skin on your back. Tell your health care provider of new moles, moles that have irregular borders, moles that are larger than a pencil eraser, or moles that have changed in shape or color.  Stay current with required vaccines (immunizations).  Influenza vaccine. All adults should be immunized every year.  Tetanus, diphtheria, and acellular pertussis (Td, Tdap) vaccine. Pregnant women should receive 1 dose of Tdap vaccine during each pregnancy. The dose should be obtained regardless of the length of time since the last dose. Immunization is preferred during the 27th-36th week of gestation. An adult who has not previously received Tdap or who does not know her vaccine status should receive 1 dose of Tdap. This initial dose should be followed by tetanus and diphtheria toxoids (Td) booster doses every 10 years. Adults with an unknown or incomplete history of completing a 3-dose immunization series with Td-containing vaccines should begin or complete a primary immunization series including a Tdap dose. Adults should receive a Td booster every 10 years.  Varicella vaccine. An adult without evidence of immunity to varicella should receive 2 doses or a second dose if she has previously received 1 dose. Pregnant females who do not have evidence of immunity should receive the first dose after pregnancy. This first dose should be obtained before leaving the health care facility. The second dose should be obtained 4-8 weeks after the first dose.  Human papillomavirus (HPV) vaccine. Females aged 13-26 years who have not received the vaccine previously  should obtain the 3-dose series. The vaccine is not recommended for use in pregnant females. However, pregnancy testing is not needed before receiving a dose. If a female is found to be pregnant after receiving a dose, no treatment is needed.  In that case, the remaining doses should be delayed until after the pregnancy. Immunization is recommended for any person with an immunocompromised condition through the age of 50 years if she did not get any or all doses earlier. During the 3-dose series, the second dose should be obtained 4-8 weeks after the first dose. The third dose should be obtained 24 weeks after the first dose and 16 weeks after the second dose.  Zoster vaccine. One dose is recommended for adults aged 26 years or older unless certain conditions are present.  Measles, mumps, and rubella (MMR) vaccine. Adults born before [birth year] generally are considered immune to measles and mumps. Adults born in [birth year] or later should have 1 or more doses of MMR vaccine unless there is a contraindication to the vaccine or there is laboratory evidence of immunity to each of the three diseases. A routine second dose of MMR vaccine should be obtained at least 28 days after the first dose for students attending postsecondary schools, health care workers, or international travelers. People who received inactivated measles vaccine or an unknown type of measles vaccine during 1963-1967 should receive 2 doses of MMR vaccine. People who received inactivated mumps vaccine or an unknown type of mumps vaccine before 1979 and are at high risk for mumps infection should consider immunization with 2 doses of MMR vaccine. For females of childbearing age, rubella immunity should be determined. If there is no evidence of immunity, females who are not pregnant should be vaccinated. If there is no evidence of immunity, females who are pregnant should delay immunization until after pregnancy. Unvaccinated health care workers born before [birth year]  who lack laboratory evidence of measles, mumps, or rubella immunity or laboratory confirmation of disease should consider measles and mumps immunization with 2 doses of MMR vaccine or rubella immunization with 1 dose of MMR vaccine.  Pneumococcal 13-valent conjugate (PCV13) vaccine. When indicated, a person who is uncertain of her immunization history and has no record of immunization should receive the PCV13 vaccine. An adult aged 9 years or older who has certain medical conditions and has not been previously immunized should receive 1 dose of PCV13 vaccine. This PCV13 should be followed with a dose of pneumococcal polysaccharide (PPSV23) vaccine. The PPSV23 vaccine dose should be obtained at least 8 weeks after the dose of PCV13 vaccine. An adult aged 55 years or older who has certain medical conditions and previously received 1 or more doses of PPSV23 vaccine should receive 1 dose of PCV13. The PCV13 vaccine dose should be obtained 1 or more years after the last PPSV23 vaccine dose.  Pneumococcal polysaccharide (PPSV23) vaccine. When PCV13 is also indicated, PCV13 should be obtained first. All adults aged 72 years and older should be immunized. An adult younger than age 59 years who has certain medical conditions should be immunized. Any person who resides in a nursing home or long-term care facility should be immunized. An adult smoker should be immunized. People with an immunocompromised condition and certain other conditions should receive both PCV13 and PPSV23 vaccines. People with human immunodeficiency virus (HIV) infection should be immunized as soon as possible after diagnosis. Immunization during chemotherapy or radiation therapy should be avoided. Routine use of PPSV23 vaccine is not recommended for American Indians, [place] Natives, or people younger than 65 years unless there are medical conditions that require PPSV23 vaccine. When indicated, people who have unknown immunization and have no record  of immunization should receive PPSV23 vaccine. One-time revaccination 5 years after the first  dose of PPSV23 is recommended for people aged 19-64 years who have chronic kidney failure, nephrotic syndrome, asplenia, or immunocompromised conditions. People who received 1-2 doses of PPSV23 before age 78 years should receive another dose of PPSV23 vaccine at age 90 years or later if at least 5 years have passed since the previous dose. Doses of PPSV23 are not needed for people immunized with PPSV23 at or after age 43 years.  Meningococcal vaccine. Adults with asplenia or persistent complement component deficiencies should receive 2 doses of quadrivalent meningococcal conjugate (MenACWY-D) vaccine. The doses should be obtained at least 2 months apart. Microbiologists working with certain meningococcal bacteria, [place] recruits, people at risk during an outbreak, and people who travel to or live in countries with a high rate of meningitis should be immunized. A first-year college student up through age 18 years who is living in a residence hall should receive a dose if she did not receive a dose on or after her 16th birthday. Adults who have certain high-risk conditions should receive one or more doses of vaccine.  Hepatitis A vaccine. Adults who wish to be protected from this disease, have certain high-risk conditions, work with hepatitis A-infected animals, work in hepatitis A research labs, or travel to or work in countries with a high rate of hepatitis A should be immunized. Adults who were previously unvaccinated and who anticipate close contact with an international adoptee during the first 60 days after arrival in the [place] from a country with a high rate of hepatitis A should be immunized.  Hepatitis B vaccine. Adults who wish to be protected from this disease, have certain high-risk conditions, may be exposed to blood or other infectious body fluids, are household contacts or sex partners of  hepatitis B positive people, are clients or workers in certain care facilities, or travel to or work in countries with a high rate of hepatitis B should be immunized.  Haemophilus influenzae type b (Hib) vaccine. A previously unvaccinated person with asplenia or sickle cell disease or having a scheduled splenectomy should receive 1 dose of Hib vaccine. Regardless of previous immunization, a recipient of a hematopoietic stem cell transplant should receive a 3-dose series 6-12 months after her successful transplant. Hib vaccine is not recommended for adults with HIV infection. Preventive Services / Frequency Ages 40 years and over  Blood pressure check.** / Every 1 to 2 years.  Lipid and cholesterol check.** / Every 5 years beginning at age 45 years.  Lung cancer screening. / Every year if you are aged 38-80 years and have a 30-pack-year history of smoking and currently smoke or have quit within the past 15 years. Yearly screening is stopped once you have quit smoking for at least 15 years or develop a health problem that would prevent you from having lung cancer treatment.  Clinical breast exam.** / Every year after age 45 years.  BRCA-related cancer risk assessment.** / For women who have family members with a BRCA-related cancer (breast, ovarian, tubal, or peritoneal cancers).  Mammogram.** / Every year beginning at age 29 years and continuing for as long as you are in good health. Consult with your health care provider.  Pap test.** / Every 3 years starting at age 75 years through age 41 or 46 years with 3 consecutive normal Pap tests. Testing can be stopped between 65 and 70 years with 3 consecutive normal Pap tests and no abnormal Pap or HPV tests in the past 10 years.  HPV screening.** / Every  3 years from ages 57 years through ages 35 or 13 years with a history of 3 consecutive normal Pap tests. Testing can be stopped between 65 and 70 years with 3 consecutive normal Pap tests and no  abnormal Pap or HPV tests in the past 10 years.  Fecal occult blood test (FOBT) of stool. / Every year beginning at age 26 years and continuing until age 49 years. You may not need to do this test if you get a colonoscopy every 10 years.  Flexible sigmoidoscopy or colonoscopy.** / Every 5 years for a flexible sigmoidoscopy or every 10 years for a colonoscopy beginning at age 38 years and continuing until age 45 years.  Hepatitis C blood test.** / For all people born from [birth year] through 1965 and any individual with known risks for hepatitis C.  Osteoporosis screening.** / A one-time screening for women ages 73 years and over and women at risk for fractures or osteoporosis.  Skin self-exam. / Monthly.  Influenza vaccine. / Every year.  Tetanus, diphtheria, and acellular pertussis (Tdap/Td) vaccine.** / 1 dose of Td every 10 years.  Varicella vaccine.** / Consult your health care provider.  Zoster vaccine.** / 1 dose for adults aged 77 years or older.  Pneumococcal 13-valent conjugate (PCV13) vaccine.** / Consult your health care provider.  Pneumococcal polysaccharide (PPSV23) vaccine.** / 1 dose for all adults aged 32 years and older.  Meningococcal vaccine.** / Consult your health care provider.  Hepatitis A vaccine.** / Consult your health care provider.  Hepatitis B vaccine.** / Consult your health care provider.  Haemophilus influenzae type b (Hib) vaccine.** / Consult your health care provider. ** Family history and personal history of risk and conditions may change your health care provider's recommendations. Document Released: 02/03/2002 Document Revised: 04/24/2014 Document Reviewed: 05/05/2011 [facility] Patient Information 2015 [place], [place]. This information is not intended to replace advice given to you by your health care provider. Make sure you discuss any questions you have with your health care provider.

## 2015-07-05 NOTE — Progress Notes (Signed)
Patient ID: Melissa Hall, female   DOB: 06/21/1928, 81 y.o.   MRN: [id number]    Subjective:   Melissa Hall is a 81 y.o. female who presents for an Initial Medicare Annual Wellness Visit.  Melissa Hall is a WDWN WF.   Her main health concern today is the pain and weakness in her left foot.  In May 2015 she fell and injured her shoulder and foot / ankle.  She fractured her humerus.  Her foot has continued to hurt and swell since then.    Current Medications (verified) Outpatient Encounter Prescriptions as of 07/05/2015  Medication Sig  . alendronate (FOSAMAX) 70 MG tablet Take 1 tablet (70 mg total) by mouth every 7 (seven) days. Take with a full glass of water on an empty stomach.  Marland Kitchen aspirin EC 81 MG tablet Take 81 mg by mouth daily.  . calcium citrate-vitamin D (CITRACAL+D) 315-200 MG-UNIT per tablet Take 1 tablet by mouth 3 (three) times daily.  . cholecalciferol (VITAMIN D) 1000 UNITS tablet Take 3,000 Units by mouth daily.   . hydrochlorothiazide (MICROZIDE) 12.5 MG capsule TAKE (1) CAPSULE DAILY  . Multiple Vitamins-Minerals (MULTIVITAMIN WITH MINERALS) tablet Take 1 tablet by mouth daily.  . pravastatin (PRAVACHOL) 10 MG tablet Take 1 tablet (10 mg total) by mouth daily.   No facility-administered encounter medications on file as of 07/05/2015.    Allergies (verified) Keflex   History: Past Medical History  Diagnosis Date  . Hypertension   . Arthritis   . High cholesterol   . GERD (gastroesophageal reflux disease)   . Osteoporosis   . Cataract    Past Surgical History  Procedure Laterality Date  . Joint replacement      bilat  . Back surgery    . Cataract extraction w/phaco  10/18/2012    Procedure: CATARACT EXTRACTION PHACO AND INTRAOCULAR LENS PLACEMENT (IOC);  Surgeon: Gemma Payor, MD;  Location: [facility];  Service: Ophthalmology;  Laterality: Left;  CDE:17.69  . Colonoscopy    . Tonsillectomy    . Orif humerus fracture Left 05/12/2014    Procedure: OPEN  REDUCTION INTERNAL FIXATION (ORIF) PROXIMAL HUMERUS FRACTURE;  Surgeon: Eldred Manges, MD;  Location: [facility];  Service: Orthopedics;  Laterality: Left;  Open Reduction Internal Fixation Left Proximal Humerus  . Eye surgery    . Abdominal hysterectomy    . Appendectomy     Family History  Problem Relation Age of Onset  . Heart disease Mother   . Osteoporosis Mother   . Heart disease Father   . Cancer Father   . Stroke Father   . Suicidality Brother   . Suicidality Daughter    Social History   Occupational History  . Not on file.   Social History Main Topics  . Smoking status: Never Smoker   . Smokeless tobacco: Never Used  . Alcohol Use: No  . Drug Use: No  . Sexual Activity: No    Do you feel safe at home?  Yes  Dietary issues and exercise activities: Current Exercise Habits:: The patient does not participate in regular exercise at present  Current Dietary habits:  Not following any special diet   Objective:    Today's Vitals   07/05/15 1206  BP: 120/78  Pulse: 68  Height: 4' 10.5" (1.486 m)  Weight: 134 lb (60.782 kg)  PainSc: 3    Body mass index is 27.53 kg/(m^2).  Activities of Daily Living In your present state of health, do you  have any difficulty performing the following activities: 07/05/2015  Hearing? N  Vision? N  Difficulty concentrating or making decisions? N  Walking or climbing stairs? N  Dressing or bathing? N  Doing errands, shopping? Y  Preparing Food and eating ? N  Using the Toilet? N  In the past six months, have you accidently leaked urine? N  Do you have problems with loss of bowel control? N  Managing your Medications? N  Managing your Finances? N  Housekeeping or managing your Housekeeping? N    Are there smokers in your home (other than you)? No   Cardiac Risk Factors include: advanced age (>5055men, 65>65 women);dyslipidemia;family history of premature cardiovascular disease;hypertension;sedentary lifestyle  Depression Screen PHQ  2/9 Scores 07/05/2015 05/14/2015 01/02/2015 09/01/2014  PHQ - 2 Score 0 0 0 0    Fall Risk Fall Risk  07/05/2015 05/14/2015 01/02/2015 09/01/2014 04/24/2014  Falls in the past year? No No Yes Yes Yes  Number falls in past yr: - - 2 or more 1 2 or more  Injury with Fall? - - - Yes -  Risk Factor Category  - - - High Fall Risk High Fall Risk  Risk for fall due to : History of fall(s);Impaired balance/gait - - - Impaired balance/gait;Impaired vision    Cognitive Function: MMSE - Mini Mental State Exam 07/05/2015  Orientation to time 4  Orientation to Place 5  Registration 3  Attention/ Calculation 4  Recall 3  Language- name 2 objects 2  Language- repeat 1  Language- follow 3 step command 2  Language- read & follow direction 1  Write a sentence 1  Copy design 0  Total score 26    Immunizations and Health Maintenance Immunization History  Administered Date(s) Administered  . Influenza Split 09/20/2013  . Influenza-Unspecified 10/10/2014  . Pneumococcal Conjugate-13 12/14/2013  . Pneumococcal Polysaccharide-23 08/10/2012   There are no preventive care reminders to display for this patient.  Patient Care Team: Ernestina Pennaonald W Moore, MD as PCP - General (Family Medicine) Claretta FraiseYen Thi Mardene SayerHong Le, MD as Referring Physician (Optometry) Suanne MarkerVikram R Penumalli, MD as Consulting Physician (Neurology)  Indicate any recent Medical Services you may have received from other than [facility] providers in the past year (date may be approximate).    Assessment:    Annual Wellness Visit  Left foot /ankle pain and swelling   Screening Tests Health Maintenance  Topic Date Due  . COLONOSCOPY  09/01/2016 (Originally 02/26/2012)  . INFLUENZA VACCINE  07/23/2015  . DEXA SCAN  01/26/2016  . MAMMOGRAM  06/18/2016  . TETANUS/TDAP  09/21/2016  . ZOSTAVAX  Completed  . PNA vac Low Risk Adult  Completed        Plan:   During the course of the visit Melissa DandyMary was educated and counseled about the following appropriate screening  and preventive services:   Vaccines to include Pneumoccal, Influenza, Hepatitis B, Td, Zostavax - appt required vaccines UTD  Colorectal cancer screening - FOBT due - patient has test at home but has not performed yet.  She was reminded of the importance of bring stool sample in.  Cardiovascular disease screening - lipids and BP at goals  Diabetes screening - UTD  Bone Denisty / Osteoporosis Screening - recheck due 01/2016  Mammogram - performed 05/2015 which was inconclusive.  She has follow up mammogram scheduled for next week  Glaucoma screening - completed by Dr Conley RollsLe 2015  Advanced Directives - UTD  Patient was given handout with examples for chair exercises.  Xray of left foot performed today - no fracture.  After discussion with Dr Christell Constant will refer to physical therapy for gait strengthening, balance assessment and strengthening of left ankle.    Patient Instructions (the written plan) were given to the patient.   Henrene Pastor, [facility]   07/05/2015

## 2015-07-09 ENCOUNTER — Ambulatory Visit
Admission: RE | Admit: 2015-07-09 | Discharge: 2015-07-09 | Disposition: A | Discharge status: Home / Self Care | Payer: Medicare Other | Source: Ambulatory Visit

## 2015-07-09 DIAGNOSIS — Z1231 Encounter for screening mammogram for malignant neoplasm of breast: Secondary | ICD-10-CM

## 2015-07-09 DIAGNOSIS — Z803 Family history of malignant neoplasm of breast: Secondary | ICD-10-CM

## 2015-07-10 ENCOUNTER — Other Ambulatory Visit: Payer: Self-pay | Admitting: Family Medicine

## 2015-07-10 ENCOUNTER — Ambulatory Visit: Payer: Medicare Other | Attending: Family Medicine | Admitting: Physical Therapy

## 2015-07-10 DIAGNOSIS — M25472 Effusion, left ankle: Secondary | ICD-10-CM | POA: Diagnosis not present

## 2015-07-10 DIAGNOSIS — M25672 Stiffness of left ankle, not elsewhere classified: Secondary | ICD-10-CM | POA: Insufficient documentation

## 2015-07-10 DIAGNOSIS — M25572 Pain in left ankle and joints of left foot: Secondary | ICD-10-CM | POA: Insufficient documentation

## 2015-07-10 DIAGNOSIS — R928 Other abnormal and inconclusive findings on diagnostic imaging of breast: Secondary | ICD-10-CM

## 2015-07-10 NOTE — Therapy (Signed)
[facility] [address] [place], [place], [address] Phone: [phone]   Fax:  [phone]  Physical Therapy Evaluation  Patient Details  Name: Melissa Hall MRN: [id number] Date of Birth: [birth year] Referring Provider:  Ernestina Penna, MD  Encounter Date: 07/10/2015      PT End of Session - 07/10/15 1153    Visit Number 1   Number of Visits 12   Date for PT Re-Evaluation 08/21/15   PT Start Time 0953   PT Stop Time 1037   PT Time Calculation (min) 44 min   Activity Tolerance Patient tolerated treatment well   Behavior During Therapy [facility] for tasks assessed/performed      Past Medical History  Diagnosis Date  . Hypertension   . Arthritis   . High cholesterol   . GERD (gastroesophageal reflux disease)   . Osteoporosis   . Cataract     Past Surgical History  Procedure Laterality Date  . Joint replacement      bilat  . Back surgery    . Cataract extraction w/phaco  10/18/2012    Procedure: CATARACT EXTRACTION PHACO AND INTRAOCULAR LENS PLACEMENT (IOC);  Surgeon: Gemma Payor, MD;  Location: [facility];  Service: Ophthalmology;  Laterality: Left;  CDE:17.69  . Colonoscopy    . Tonsillectomy    . Orif humerus fracture Left 05/12/2014    Procedure: OPEN REDUCTION INTERNAL FIXATION (ORIF) PROXIMAL HUMERUS FRACTURE;  Surgeon: Eldred Manges, MD;  Location: [facility];  Service: Orthopedics;  Laterality: Left;  Open Reduction Internal Fixation Left Proximal Humerus  . Eye surgery    . Abdominal hysterectomy    . Appendectomy      There were no vitals filed for this visit.  Visit Diagnosis:  Left ankle pain - Plan: PT plan of care cert/re-cert  Left ankle swelling - Plan: PT plan of care cert/re-cert  Ankle stiffness, left - Plan: PT plan of care cert/re-cert      Subjective Assessment - 07/10/15 1130    Subjective My left ankle hurts and swells.  My doctor made sure it was not a bloodclot.   Limitations Walking   How long can  you walk comfortably? 20 minutes.   Patient Stated Goals Get rid of my left ankle pain and swelling.   Pain Score 9    Pain Location Foot   Pain Orientation Left   Pain Descriptors / Indicators Aching;Constant   Pain Type Chronic pain   Pain Onset More than a month ago   Pain Frequency Constant   Effect of Pain on Daily Activities Cannot walk a lot with left ankle painand swelling.            [facility] PT Assessment - 07/10/15 0001    Assessment   Medical Diagnosis Pain and swelling of toe and left foot.   Onset Date/Surgical Date --  10 months.   Precautions   Precaution Comments Walking with straight cane for safety.   Restrictions   Weight Bearing Restrictions No   Balance Screen   Has the patient fallen in the past 6 months No   Has the patient had a decrease in activity level because of a fear of falling?  Yes   Is the patient reluctant to leave their home because of a fear of falling?  No   Home [employer] information [employer] manager residence   Prior Function   Level of Independence Independent   Cognition   Overall Cognitive Status Within Functional  Limits for tasks assessed   Observation/Other Assessments   Focus on Therapeutic Outcomes (FOTO)  60% limitation.   Observation/Other Assessments-Edema    Edema --  Localized edema around left lateral malleolus.   ROM / Strength   AROM / PROM / Strength AROM;Strength   AROM   Overall AROM Comments Active left ankle dorsiflexion with knee extended and flexed = 5 degrees.   Strength   Overall Strength Comments Normal left ankle strength.   Palpation   Palpation comment Tender to palpation along the patient's tibialis anterior near tibia with pain palpable over dorsum of left foot.   Ambulation/Gait   Gait Comments The patient ambulates with a straight cane and reports no falls.                   [facility] Adult PT Treatment/Exercise - July 12, 2015 0001    Modalities   Modalities Electrical Stimulation    Electrical Stimulation   Electrical Stimulation Location left tibialis anterior   Electrical Stimulation Action 80-150 HZ (5 sec on and 5 sec off) x 15 minutes.  4 electrodes.   Electrical Stimulation Goals Pain                  PT Short Term Goals - Jul 12, 2015 1157    PT SHORT TERM GOAL #1   Title Ind with a HEP.   Time 2   Period Weeks   Status New           PT Long Term Goals - 07-12-15 1157    PT LONG TERM GOAL #1   Title Patient walk a community distance with left ankle pain not > 3/10.   Time 6   Period Weeks   Status New   PT LONG TERM GOAL #2   Title Perform ADL's with pain not greater than 3/10.   Time 6   Period Weeks   Status New   PT LONG TERM GOAL #3   Title Active left dorsiflexion= 15 degrees with left knee flexed.   Time 6   Period Weeks   Status New               Plan - 07-12-2015 1154    Clinical Impression Statement The patient reports increased left ankle pain and swelling over the last 10 months especially after walking longer distances.  She reports pain rises to a 9/10.  Testing was performed to r/o bloodclots.   Pt will benefit from skilled therapeutic intervention in order to improve on the following deficits Pain;Decreased activity tolerance   Rehab Potential Good   PT Frequency 2x / week   PT Duration 6 weeks   PT Treatment/Interventions ADLs/Self Care Home Management;Cryotherapy;Lawyer;Therapeutic activities;Therapeutic exercise;Patient/family education;Manual techniques;Passive range of motion;Vasopneumatic Device   PT Next Visit Plan Calf stretching----especially Soleus.  STW/M and Combo to left tibilias anterior and ankle metatarsal mobs.   Consulted and Agree with Plan of Care Patient          G-Codes - 12-Jul-2015 1200    Functional Assessment Tool Used FOTO.   Functional Limitation Mobility: Walking and moving around   Mobility: Walking and Moving Around Current  Status [phone]) At least 60 percent but less than 80 percent impaired, limited or restricted   Mobility: Walking and Moving Around Goal Status [phone]) At least 40 percent but less than 60 percent impaired, limited or restricted       Problem List Patient Active Problem List   Diagnosis Date Noted  .  Fracture of proximal end of left humerus 05/13/2014  . Traumatic closed displaced fracture of proximal end of left humerus 05/12/2014    Class: Acute  . Proximal humerus fracture 05/12/2014  . Osteoporosis, post-menopausal 01/25/2014  . Hyperlipidemia 12/14/2013  . Hypertension 12/14/2013  . Vitamin D deficiency 12/14/2013  . Scoliosis (and kyphoscoliosis), idiopathic 12/14/2013    Maryan Sivak, [place] MPT 07/10/2015, 12:03 PM  [facility] [address] [place], [place], [address] Phone: [phone]   Fax:  [phone]

## 2015-07-12 ENCOUNTER — Encounter: Payer: Self-pay | Admitting: *Deleted

## 2015-07-12 ENCOUNTER — Ambulatory Visit: Payer: Medicare Other | Admitting: Physical Therapy

## 2015-07-12 ENCOUNTER — Encounter: Payer: Self-pay | Admitting: Physical Therapy

## 2015-07-12 DIAGNOSIS — M25472 Effusion, left ankle: Secondary | ICD-10-CM | POA: Diagnosis not present

## 2015-07-12 DIAGNOSIS — M25572 Pain in left ankle and joints of left foot: Secondary | ICD-10-CM | POA: Diagnosis not present

## 2015-07-12 DIAGNOSIS — M25672 Stiffness of left ankle, not elsewhere classified: Secondary | ICD-10-CM | POA: Diagnosis not present

## 2015-07-12 NOTE — Therapy (Signed)
[facility] [address] [place], [place], [address] Phone: [phone]   Fax:  [phone]  Physical Therapy Treatment  Patient Details  Name: Melissa Hall MRN: [id number] Date of Birth: 1928-11-02 Referring Provider:  Ernestina Penna, MD  Encounter Date: 07/12/2015      PT End of Session - 07/12/15 0949    Visit Number 2   Number of Visits 12   Date for PT Re-Evaluation 08/21/15   PT Start Time 0947   PT Stop Time 1037   PT Time Calculation (min) 50 min   Equipment Utilized During Treatment Other (comment)  SPC   Activity Tolerance Patient tolerated treatment well   Behavior During Therapy [facility] for tasks assessed/performed      Past Medical History  Diagnosis Date  . Hypertension   . Arthritis   . High cholesterol   . GERD (gastroesophageal reflux disease)   . Osteoporosis   . Cataract     Past Surgical History  Procedure Laterality Date  . Joint replacement      bilat  . Back surgery    . Cataract extraction w/phaco  10/18/2012    Procedure: CATARACT EXTRACTION PHACO AND INTRAOCULAR LENS PLACEMENT (IOC);  Surgeon: Gemma Payor, MD;  Location: [facility];  Service: Ophthalmology;  Laterality: Left;  CDE:17.69  . Colonoscopy    . Tonsillectomy    . Orif humerus fracture Left 05/12/2014    Procedure: OPEN REDUCTION INTERNAL FIXATION (ORIF) PROXIMAL HUMERUS FRACTURE;  Surgeon: Eldred Manges, MD;  Location: [facility];  Service: Orthopedics;  Laterality: Left;  Open Reduction Internal Fixation Left Proximal Humerus  . Eye surgery    . Abdominal hysterectomy    . Appendectomy      There were no vitals filed for this visit.  Visit Diagnosis:  Left ankle pain  Left ankle swelling  Ankle stiffness, left      Subjective Assessment - 07/12/15 0948    Subjective Reports continued swelling and knots between L metatarsals.   Limitations Walking   How long can you walk comfortably? 20 minutes.   Patient Stated Goals Get rid of  my left ankle pain and swelling.   Currently in Pain? Yes   Pain Score 5    Pain Location Ankle   Pain Orientation Left   Pain Descriptors / Indicators Aching   Pain Type Chronic pain   Pain Onset More than a month ago   Pain Frequency Constant            [facility] PT Assessment - 07/12/15 0001    Assessment   Medical Diagnosis Pain and swelling of toe and left foot.                     [facility] Adult PT Treatment/Exercise - 07/12/15 0001    Modalities   Modalities Electrical Stimulation;Vasopneumatic   Electrical Stimulation   Electrical Stimulation Location L Tib. Ant/ Ankle   Electrical Stimulation Action IFC   Electrical Stimulation Parameters 1-10 Hz x15 min   Electrical Stimulation Goals Pain;Edema   Vasopneumatic   Number Minutes Vasopneumatic  15 minutes   Vasopnuematic Location  Ankle   Vasopneumatic Pressure Medium   Vasopneumatic Temperature  45   Manual Therapy   Manual Therapy Passive ROM;Myofascial release   Myofascial Release STW/ IASTW to L Tibalis Anterior to decrease pain   Passive ROM Passive L gastroc/ soleus stretch in sitting 3 x30 seconds  PT Short Term Goals - 07/10/15 1157    PT SHORT TERM GOAL #1   Title Ind with a HEP.   Time 2   Period Weeks   Status New           PT Long Term Goals - 07/10/15 1157    PT LONG TERM GOAL #1   Title Patient walk a community distance with left ankle pain not > 3/10.   Time 6   Period Weeks   Status New   PT LONG TERM GOAL #2   Title Perform ADL's with pain not greater than 3/10.   Time 6   Period Weeks   Status New   PT LONG TERM GOAL #3   Title Active left dorsiflexion= 15 degrees with left knee flexed.   Time 6   Period Weeks   Status New               Plan - 07/12/15 1025    Clinical Impression Statement Patient tolerated treatment well today with tenderness/ pain reported during manual therapy along the lower L Tibialis Anterior. Minimal  tightness noted in the L Tibialis Anterior during manual therapy but had edema around the L ankle presented. Nodules located in the L metatarsals were firm to touch and did not give very much when palpated. Mostly STW conducted with minimal IASTW as to not aggravate superficial blood vessels that are visible in the L lower leg. Normal modalites response noted following removal of the modalites. Denied pain following treatment. L ankle swelling and nodules in the L metatarsals were not as pronouced as they were at the beginning of treatment.   Pt will benefit from skilled therapeutic intervention in order to improve on the following deficits Pain;Decreased activity tolerance   Rehab Potential Good   PT Frequency 2x / week   PT Duration 6 weeks   PT Treatment/Interventions ADLs/Self Care Home Management;Cryotherapy;Lawyer;Therapeutic activities;Therapeutic exercise;Patient/family education;Manual techniques;Passive range of motion;Vasopneumatic Device   PT Next Visit Plan Calf stretching----especially Soleus.  STW/M and Combo to left tibilias anterior and ankle metatarsal mobs.   Consulted and Agree with Plan of Care Patient        Problem List Patient Active Problem List   Diagnosis Date Noted  . Fracture of proximal end of left humerus 05/13/2014  . Traumatic closed displaced fracture of proximal end of left humerus 05/12/2014    Class: Acute  . Proximal humerus fracture 05/12/2014  . Osteoporosis, post-menopausal 01/25/2014  . Hyperlipidemia 12/14/2013  . Hypertension 12/14/2013  . Vitamin D deficiency 12/14/2013  . Scoliosis (and kyphoscoliosis), idiopathic 12/14/2013    Evelene Croon, PTA 07/12/2015, 10:45 AM  [facility] [address] [place], [place], [address] Phone: [phone]   Fax:  [phone]

## 2015-07-13 ENCOUNTER — Ambulatory Visit
Admission: RE | Admit: 2015-07-13 | Discharge: 2015-07-13 | Disposition: A | Discharge status: Home / Self Care | Payer: Medicare Other | Source: Ambulatory Visit | Attending: Family Medicine | Admitting: Family Medicine

## 2015-07-13 ENCOUNTER — Encounter: Payer: Self-pay | Admitting: Family Medicine

## 2015-07-13 DIAGNOSIS — R928 Other abnormal and inconclusive findings on diagnostic imaging of breast: Secondary | ICD-10-CM

## 2015-07-17 ENCOUNTER — Ambulatory Visit: Payer: Medicare Other | Admitting: *Deleted

## 2015-07-17 ENCOUNTER — Encounter: Payer: Self-pay | Admitting: *Deleted

## 2015-07-17 DIAGNOSIS — M25472 Effusion, left ankle: Secondary | ICD-10-CM

## 2015-07-17 DIAGNOSIS — M25572 Pain in left ankle and joints of left foot: Secondary | ICD-10-CM

## 2015-07-17 DIAGNOSIS — M25672 Stiffness of left ankle, not elsewhere classified: Secondary | ICD-10-CM | POA: Diagnosis not present

## 2015-07-17 DIAGNOSIS — Z0289 Encounter for other administrative examinations: Secondary | ICD-10-CM

## 2015-07-17 NOTE — Therapy (Signed)
[facility] [address] [place], [place], [address] Phone: [phone]   Fax:  [phone]  Physical Therapy Treatment  Patient Details  Name: Melissa Hall MRN: [id number] Date of Birth: [birth year] Referring Provider:  Ernestina Penna, MD  Encounter Date: 07/17/2015      PT End of Session - 07/17/15 0938    Visit Number 3   Number of Visits 12   Date for PT Re-Evaluation 08/21/15   PT Start Time 0900   PT Stop Time 0947   PT Time Calculation (min) 47 min      Past Medical History  Diagnosis Date  . Hypertension   . Arthritis   . High cholesterol   . GERD (gastroesophageal reflux disease)   . Osteoporosis   . Cataract     Past Surgical History  Procedure Laterality Date  . Joint replacement      bilat  . Back surgery    . Cataract extraction w/phaco  10/18/2012    Procedure: CATARACT EXTRACTION PHACO AND INTRAOCULAR LENS PLACEMENT (IOC);  Surgeon: Gemma Payor, MD;  Location: [facility];  Service: Ophthalmology;  Laterality: Left;  CDE:17.69  . Colonoscopy    . Tonsillectomy    . Orif humerus fracture Left 05/12/2014    Procedure: OPEN REDUCTION INTERNAL FIXATION (ORIF) PROXIMAL HUMERUS FRACTURE;  Surgeon: Eldred Manges, MD;  Location: [facility];  Service: Orthopedics;  Laterality: Left;  Open Reduction Internal Fixation Left Proximal Humerus  . Eye surgery    . Abdominal hysterectomy    . Appendectomy      There were no vitals filed for this visit.  Visit Diagnosis:  Left ankle pain  Left ankle swelling  Ankle stiffness, left      Subjective Assessment - 07/17/15 0912    Subjective Reports continued swelling and knots between L metatarsals.Did good after last Rx   Limitations Walking   How long can you walk comfortably? 20 minutes.   Patient Stated Goals Get rid of my left ankle pain and swelling.   Pain Score 2    Pain Location Ankle   Pain Orientation Left   Pain Descriptors / Indicators Aching   Pain Type  Chronic pain   Pain Frequency Intermittent                         [facility] Adult PT Treatment/Exercise - 07/17/15 0001    Electrical Stimulation   Electrical Stimulation Location L Tib. Ant/ Ankle IFC 1-10 hz x15 mins   Electrical Stimulation Goals Pain;Edema   Vasopneumatic   Number Minutes Vasopneumatic  15 minutes   Vasopnuematic Location  Ankle   Vasopneumatic Pressure Medium   Vasopneumatic Temperature  45   Manual Therapy   Manual Therapy Passive ROM;Myofascial release   Myofascial Release STW/ IASTW to L Tibalis Anterior and soleus with active ankle pumps.All to decrease pain and muscle tension   Passive ROM Passive L gastroc/ soleus stretch in sitting 3 x30 seconds                  PT Short Term Goals - 07/10/15 1157    PT SHORT TERM GOAL #1   Title Ind with a HEP.   Time 2   Period Weeks   Status New           PT Long Term Goals - 07/10/15 1157    PT LONG TERM GOAL #1   Title Patient walk a community distance  with left ankle pain not > 3/10.   Time 6   Period Weeks   Status New   PT LONG TERM GOAL #2   Title Perform ADL's with pain not greater than 3/10.   Time 6   Period Weeks   Status New   PT LONG TERM GOAL #3   Title Active left dorsiflexion= 15 degrees with left knee flexed.   Time 6   Period Weeks   Status New               Plan - 07/17/15 1610    Clinical Impression Statement Pt did fairly well with Rx., and had less pain today. She has notable soreness still to anterior tibialis and soleus   Pt will benefit from skilled therapeutic intervention in order to improve on the following deficits Pain;Decreased activity tolerance   Rehab Potential Good   PT Frequency 2x / week   PT Duration 6 weeks   PT Treatment/Interventions ADLs/Self Care Home Management;Cryotherapy;Lawyer;Therapeutic activities;Therapeutic exercise;Patient/family education;Manual  techniques;Passive range of motion;Vasopneumatic Device   PT Next Visit Plan Calf stretching----especially Soleus.  STW/M and Combo to left tibilias anterior and ankle metatarsal mobs.   Consulted and Agree with Plan of Care Patient        Problem List Patient Active Problem List   Diagnosis Date Noted  . Fracture of proximal end of left humerus 05/13/2014  . Traumatic closed displaced fracture of proximal end of left humerus 05/12/2014    Class: Acute  . Proximal humerus fracture 05/12/2014  . Osteoporosis, post-menopausal 01/25/2014  . Hyperlipidemia 12/14/2013  . Hypertension 12/14/2013  . Vitamin D deficiency 12/14/2013  . Scoliosis (and kyphoscoliosis), idiopathic 12/14/2013    Dortha Neighbors,CHRIS, PTA 07/17/2015, 12:30 PM  [facility] [address] [place], [place], [address] Phone: [phone]   Fax:  [phone]

## 2015-07-18 ENCOUNTER — Other Ambulatory Visit: Payer: Self-pay | Admitting: Family Medicine

## 2015-07-19 ENCOUNTER — Ambulatory Visit: Payer: Medicare Other | Admitting: Physical Therapy

## 2015-07-19 DIAGNOSIS — M25472 Effusion, left ankle: Secondary | ICD-10-CM | POA: Diagnosis not present

## 2015-07-19 DIAGNOSIS — M25572 Pain in left ankle and joints of left foot: Secondary | ICD-10-CM

## 2015-07-19 DIAGNOSIS — M25672 Stiffness of left ankle, not elsewhere classified: Secondary | ICD-10-CM | POA: Diagnosis not present

## 2015-07-19 NOTE — Therapy (Signed)
[facility] [address] [place], [place], [address] Phone: [phone]   Fax:  [phone]  Physical Therapy Treatment  Patient Details  Name: Melissa Hall MRN: [id number] Date of Birth: 19-Feb-1928 Referring Provider:  Ernestina Penna, MD  Encounter Date: 07/19/2015      PT End of Session - 07/19/15 0904    Visit Number 4   Number of Visits 12   Date for PT Re-Evaluation 08/21/15   PT Start Time 0904   PT Stop Time 0950   PT Time Calculation (min) 46 min      Past Medical History  Diagnosis Date  . Hypertension   . Arthritis   . High cholesterol   . GERD (gastroesophageal reflux disease)   . Osteoporosis   . Cataract     Past Surgical History  Procedure Laterality Date  . Joint replacement      bilat  . Back surgery    . Cataract extraction w/phaco  10/18/2012    Procedure: CATARACT EXTRACTION PHACO AND INTRAOCULAR LENS PLACEMENT (IOC);  Surgeon: Gemma Payor, MD;  Location: [facility];  Service: Ophthalmology;  Laterality: Left;  CDE:17.69  . Colonoscopy    . Tonsillectomy    . Orif humerus fracture Left 05/12/2014    Procedure: OPEN REDUCTION INTERNAL FIXATION (ORIF) PROXIMAL HUMERUS FRACTURE;  Surgeon: Eldred Manges, MD;  Location: [facility];  Service: Orthopedics;  Laterality: Left;  Open Reduction Internal Fixation Left Proximal Humerus  . Eye surgery    . Abdominal hysterectomy    . Appendectomy      There were no vitals filed for this visit.  Visit Diagnosis:  Left ankle pain  Left ankle swelling  Ankle stiffness, left      Subjective Assessment - 07/19/15 0937    Subjective "Its so much better than when I started. With hardly any swelling or tenderness." Continues to have some pain around the 4-5th metatarsal during ambulation.   Limitations Walking   How long can you walk comfortably? 20 minutes.   Patient Stated Goals Get rid of my left ankle pain and swelling.   Currently in Pain? No/denies                          [facility] Adult PT Treatment/Exercise - 07/19/15 0001    Electrical Stimulation   Electrical Stimulation Location L Tib Ant/ Ankle   Electrical Stimulation Action IFC   Electrical Stimulation Parameters 1-10 Hz x15 min   Electrical Stimulation Goals Pain;Edema   Vasopneumatic   Number Minutes Vasopneumatic  15 minutes   Vasopnuematic Location  Ankle   Vasopneumatic Pressure Medium   Vasopneumatic Temperature  34   Manual Therapy   Manual Therapy Passive ROM;Myofascial release   Myofascial Release STW to L Tib. Anterior to decrease tightness   Passive ROM Passive L gastroc/ soleus/ Tib Ant/ Evertors stretch in sitting 3 x30 seconds                  PT Short Term Goals - 07/10/15 1157    PT SHORT TERM GOAL #1   Title Ind with a HEP.   Time 2   Period Weeks   Status New           PT Long Term Goals - 07/19/15 0102    PT LONG TERM GOAL #1   Title Patient walk a community distance with left ankle pain not > 3/10.   Time 6  Period Weeks   Status Achieved   PT LONG TERM GOAL #2   Title Perform ADL's with pain not greater than 3/10.   Time 6   Period Weeks   Status Achieved   PT LONG TERM GOAL #3   Title Active left dorsiflexion= 15 degrees with left knee flexed.   Time 6   Period Weeks   Status New               Plan - 07/19/15 0944    Clinical Impression Statement Patient tolerated treatment very well today with limited soreness and tenderness in the L Tibialis Anterior. Demonstrates decreased LLE swelling that presented only in the L ankle around the malleoli today. Continues to have the nodules of firm tissue in the L metatarsals that are observable, continue to be firm with some give around the edges. Only reported minimal tenderness around the mid L Tibialis Anterior with minimal tightness noted throughout the L Tibialis Anterior. Normal modalties response noted following the removal of the modalities. Achieved  ambulation and ADLs goals but stated that at the end of the day she has increased pain. MPT consultd with patient regarding possible D/C today but will take one week off from therapy if there is a flare up or no pain. Denied pain following treatment.   Pt will benefit from skilled therapeutic intervention in order to improve on the following deficits Pain;Decreased activity tolerance   Rehab Potential Good   PT Frequency 2x / week   PT Duration 6 weeks   PT Treatment/Interventions ADLs/Self Care Home Management;Cryotherapy;Lawyer;Therapeutic activities;Therapeutic exercise;Patient/family education;Manual techniques;Passive range of motion;Vasopneumatic Device   PT Next Visit Plan Calf stretching----especially Soleus.  STW/M and Combo to left tibilias anterior and ankle metatarsal mobs. Take one week off from PT and continue if pain or D/C if no pain.   Consulted and Agree with Plan of Care Patient        Problem List Patient Active Problem List   Diagnosis Date Noted  . Fracture of proximal end of left humerus 05/13/2014  . Traumatic closed displaced fracture of proximal end of left humerus 05/12/2014    Class: Acute  . Proximal humerus fracture 05/12/2014  . Osteoporosis, post-menopausal 01/25/2014  . Hyperlipidemia 12/14/2013  . Hypertension 12/14/2013  . Vitamin D deficiency 12/14/2013  . Scoliosis (and kyphoscoliosis), idiopathic 12/14/2013    Florence Canner, PTA 07/19/2015 9:55 AM  [facility] [address] [place], [place], [address] Phone: [phone]   Fax:  [phone]

## 2015-08-02 ENCOUNTER — Ambulatory Visit: Payer: Medicare Other | Attending: Family Medicine | Admitting: Physical Therapy

## 2015-08-02 ENCOUNTER — Encounter: Payer: Self-pay | Admitting: Physical Therapy

## 2015-08-02 DIAGNOSIS — M25672 Stiffness of left ankle, not elsewhere classified: Secondary | ICD-10-CM | POA: Insufficient documentation

## 2015-08-02 DIAGNOSIS — M25572 Pain in left ankle and joints of left foot: Secondary | ICD-10-CM

## 2015-08-02 DIAGNOSIS — M25472 Effusion, left ankle: Secondary | ICD-10-CM | POA: Diagnosis not present

## 2015-08-02 NOTE — Patient Instructions (Signed)
Stretching: Gastroc   Stand with right foot back, leg straight, forward leg bent. Keeping heel on floor, turned slightly out, lean into wall until stretch is felt in calf. Hold __20__ seconds. Repeat __3__ times per set. Do _1___ sets per session. Do __1-2__ sessions per day.  http://orth.exer.us/662   Copyright  VHI. All rights reserved.  Stretching: Soleus   Stand with right foot back, both knees bent. Keeping heel on floor, turned slightly out, lean into wall until stretch is felt in lower calf. Hold __20__ seconds. Repeat __3__ times per set. Do _1___ sets per session. Do _1-2___ sessions per day.  http://orth.exer.us/664   Copyright  VHI. All rights reserved.

## 2015-08-02 NOTE — Therapy (Signed)
[facility] [place], [place], [address] Phone: [phone]   Fax:  [phone]  Physical Therapy Treatment  Patient Details  Name: Melissa Hall MRN: [id number] Date of Birth: 1928-09-09 Referring Provider:  Chipper Herb, MD  Encounter Date: 08/02/2015      PT End of Session - 08/02/15 1356    Visit Number 5   Number of Visits 12   Date for PT Re-Evaluation 08/21/15   PT Start Time 1315   PT Stop Time 1411   PT Time Calculation (min) 56 min   Activity Tolerance Patient tolerated treatment well   Behavior During Therapy [facility] for tasks assessed/performed      Past Medical History  Diagnosis Date  . Hypertension   . Arthritis   . High cholesterol   . GERD (gastroesophageal reflux disease)   . Osteoporosis   . Cataract     Past Surgical History  Procedure Laterality Date  . Joint replacement      bilat  . Back surgery    . Cataract extraction w/phaco  10/18/2012    Procedure: CATARACT EXTRACTION PHACO AND INTRAOCULAR LENS PLACEMENT (IOC);  Surgeon: Tonny Branch, MD;  Location: [facility];  Service: Ophthalmology;  Laterality: Left;  CDE:17.69  . Colonoscopy    . Tonsillectomy    . Orif humerus fracture Left 05/12/2014    Procedure: OPEN REDUCTION INTERNAL FIXATION (ORIF) PROXIMAL HUMERUS FRACTURE;  Surgeon: [name], MD;  Location: [place];  Service: Orthopedics;  Laterality: Left;  Open Reduction Internal Fixation Left Proximal Humerus  . Eye surgery    . Abdominal hysterectomy    . Appendectomy      There were no vitals filed for this visit.  Visit Diagnosis:  Left ankle pain  Left ankle swelling  Ankle stiffness, left      Subjective Assessment - 08/02/15 1319    Subjective 80% better in leg no better in foot per patient   Limitations Walking   How long can you walk comfortably? 20 minutes.   Patient Stated Goals Get rid of my left ankle pain and swelling.   Currently in Pain? Yes   Pain Score  8    Pain Location Ankle   Pain Orientation Left   Pain Descriptors / Indicators Aching   Pain Type Chronic pain   Pain Onset More than a month ago   Pain Frequency Intermittent   Aggravating Factors  walking   Pain Relieving Factors rest            [facility] PT Assessment - 08/02/15 0001    AROM   Overall AROM Comments 5degrees DF knee bent                     [facility] Adult PT Treatment/Exercise - 08/02/15 0001    Modalities   Modalities Electrical Stimulation;Ultrasound;Vasopneumatic   Electrical Stimulation   Electrical Stimulation Location L Tib Ant/ Ankle   Electrical Stimulation Action IFC   Electrical Stimulation Parameters 1-_0    Electrical Stimulation Goals Pain;Edema   Ultrasound   Ultrasound Location left ankle medial side   Ultrasound Parameters 1.2w/cm2/50%/3.40mz x83m    Ultrasound Goals Pain   Vasopneumatic   Number Minutes Vasopneumatic  15 minutes   Vasopnuematic Location  Ankle   Vasopneumatic Pressure Medium   Manual Therapy   Manual Therapy Passive ROM;Myofascial release   Myofascial Release STW to L Tib. Anterior to decrease tightness   Passive ROM Passive L gastroc/ soleus/  Tib Ant/ Evertors stretch in sitting 3 x30 seconds                PT Education - 08/02/15 1333    Education provided Yes   Education Details HEP   Person(s) Educated Patient   Methods Explanation;Demonstration;Handout   Comprehension Verbalized understanding;Returned demonstration          PT Short Term Goals - 08/02/15 1334    PT SHORT TERM GOAL #1   Title Ind with a HEP.   Time 2   Period Weeks   Status Achieved           PT Long Term Goals - 08/02/15 1334    PT LONG TERM GOAL #1   Title Patient walk a community distance with left ankle pain not > 3/10.   Time 6   Period Weeks   Status Achieved   PT LONG TERM GOAL #2   Title Perform ADL's with pain not greater than 3/10.   Time 6   Period Weeks   Status Achieved   PT LONG TERM  GOAL #3   Title Active left dorsiflexion= 15 degrees with left knee flexed.   Time 6   Period Weeks   Status On-going               Plan - 08/02/15 1401    Clinical Impression Statement Patient progressing with reports of 80% improvement in left leg yet not a lot better in anlke medial side. Pain level is very high esp with walking. Educated patient on HEP and may get new shoes or shoe incerts for suppot to help decrease pain. Met STG #1 other goals ongoing due to pain limitations and ROM limitation for DF.   Pt will benefit from skilled therapeutic intervention in order to improve on the following deficits Pain;Decreased activity tolerance   PT Frequency 2x / week   PT Duration 6 weeks   PT Treatment/Interventions ADLs/Self Care Home Management;Cryotherapy;Air traffic controller;Therapeutic activities;Therapeutic exercise;Patient/family education;Manual techniques;Passive range of motion;Vasopneumatic Device   PT Next Visit Plan Calf stretching----especially Soleus.  STW/M and Combo to left tibilias anterior and ankle metatarsal mobs. Take one week off from PT and continue if pain or D/C if no pain.        Problem List Patient Active Problem List   Diagnosis Date Noted  . Fracture of proximal end of left humerus 05/13/2014  . Traumatic closed displaced fracture of proximal end of left humerus 05/12/2014    Class: Acute  . Proximal humerus fracture 05/12/2014  . Osteoporosis, post-menopausal 01/25/2014  . Hyperlipidemia 12/14/2013  . Hypertension 12/14/2013  . Vitamin D deficiency 12/14/2013  . Scoliosis (and kyphoscoliosis), idiopathic 12/14/2013    Phillips Climes 08/02/2015, 2:16 PM  [facility] [address] [place], [place], [address] Phone: [phone]   Fax:  [phone]

## 2015-08-07 ENCOUNTER — Encounter: Payer: Self-pay | Admitting: *Deleted

## 2015-08-07 ENCOUNTER — Ambulatory Visit: Payer: Medicare Other | Admitting: *Deleted

## 2015-08-07 DIAGNOSIS — M25572 Pain in left ankle and joints of left foot: Secondary | ICD-10-CM | POA: Diagnosis not present

## 2015-08-07 DIAGNOSIS — M25472 Effusion, left ankle: Secondary | ICD-10-CM | POA: Diagnosis not present

## 2015-08-07 DIAGNOSIS — M25672 Stiffness of left ankle, not elsewhere classified: Secondary | ICD-10-CM | POA: Diagnosis not present

## 2015-08-07 NOTE — Therapy (Signed)
[facility] [address] [place], [place], [address] Phone: [phone]   Fax:  [phone]  Physical Therapy Treatment  Patient Details  Name: Melissa Hall MRN: [id number] Date of Birth: March 17, 1928 Referring Provider:  Ernestina Penna, MD  Encounter Date: 08/07/2015      PT End of Session - 08/07/15 1021    Visit Number 6   Number of Visits 12   Date for PT Re-Evaluation 08/21/15   PT Start Time 0945   PT Stop Time 1034   PT Time Calculation (min) 49 min      Past Medical History  Diagnosis Date  . Hypertension   . Arthritis   . High cholesterol   . GERD (gastroesophageal reflux disease)   . Osteoporosis   . Cataract     Past Surgical History  Procedure Laterality Date  . Joint replacement      bilat  . Back surgery    . Cataract extraction w/phaco  10/18/2012    Procedure: CATARACT EXTRACTION PHACO AND INTRAOCULAR LENS PLACEMENT (IOC);  Surgeon: Gemma Payor, MD;  Location: [facility];  Service: Ophthalmology;  Laterality: Left;  CDE:17.69  . Colonoscopy    . Tonsillectomy    . Orif humerus fracture Left 05/12/2014    Procedure: OPEN REDUCTION INTERNAL FIXATION (ORIF) PROXIMAL HUMERUS FRACTURE;  Surgeon: Eldred Manges, MD;  Location: [facility];  Service: Orthopedics;  Laterality: Left;  Open Reduction Internal Fixation Left Proximal Humerus  . Eye surgery    . Abdominal hysterectomy    . Appendectomy      There were no vitals filed for this visit.  Visit Diagnosis:  Left ankle pain  Left ankle swelling  Ankle stiffness, left      Subjective Assessment - 08/07/15 0956    Subjective 80% better in leg no better in foot per patient,    Limitations Walking   How long can you walk comfortably? 20 minutes.   Patient Stated Goals Get rid of my left ankle pain and swelling.   Pain Score 4    Pain Location Ankle   Pain Orientation Left   Pain Descriptors / Indicators Aching   Pain Type Chronic pain   Pain Onset More  than a month ago   Pain Frequency Intermittent   Aggravating Factors  walking   Pain Relieving Factors rest,PT Rxs                         [facility] Adult PT Treatment/Exercise - 08/07/15 0001    Modalities   Modalities Electrical Stimulation;Ultrasound   Electrical Stimulation   Electrical Stimulation Location L Tib Ant/ Ankle   Electrical Stimulation Action IFC   Electrical Stimulation Parameters 1-10hz x 15 mins   Electrical Stimulation Goals Pain;Edema   Ultrasound   Ultrasound Location LT ankle Lateral aspect   Ultrasound Parameters 1 w/cm2 x 8 min pulsed 50%   Ultrasound Goals Pain   Vasopneumatic   Number Minutes Vasopneumatic  15 minutes   Vasopnuematic Location  Ankle   Vasopneumatic Pressure Medium   Vasopneumatic Temperature  34   Manual Therapy   Manual Therapy Passive ROM;Myofascial release   Myofascial Release STW to L Tib. Anterior, Evertors, and soleus to decrease tightness   Passive ROM Passive L gastroc/ soleus/ Tib Ant/ Evertors stretch in sitting 3 x30 seconds                  PT Short Term Goals - 08/02/15  1334    PT SHORT TERM GOAL #1   Title Ind with a HEP.   Time 2   Period Weeks   Status Achieved           PT Long Term Goals - 08/02/15 1334    PT LONG TERM GOAL #1   Title Patient walk a community distance with left ankle pain not > 3/10.   Time 6   Period Weeks   Status Achieved   PT LONG TERM GOAL #2   Title Perform ADL's with pain not greater than 3/10.   Time 6   Period Weeks   Status Achieved   PT LONG TERM GOAL #3   Title Active left dorsiflexion= 15 degrees with left knee flexed.   Time 6   Period Weeks   Status On-going               Plan - 08/07/15 1022    Clinical Impression Statement Pt feels that she is 80% better overall, but still has pain in LT ankle that gets up to $/10 at times after being up a while. She still has some DF limitations when the Knee is bent. Goals are ongoing   Pt will  benefit from skilled therapeutic intervention in order to improve on the following deficits Pain;Decreased activity tolerance   Rehab Potential Good   PT Duration 6 weeks   PT Treatment/Interventions ADLs/Self Care Home Management;Cryotherapy;Lawyer;Therapeutic activities;Therapeutic exercise;Patient/family education;Manual techniques;Passive range of motion;Vasopneumatic Device   PT Next Visit Plan Calf stretching----especially Soleus.  STW/M and Combo to left tibilias anterior and ankle metatarsal mobs. continue if pain or D/C if Pt wants to        Problem List Patient Active Problem List   Diagnosis Date Noted  . Fracture of proximal end of left humerus 05/13/2014  . Traumatic closed displaced fracture of proximal end of left humerus 05/12/2014    Class: Acute  . Proximal humerus fracture 05/12/2014  . Osteoporosis, post-menopausal 01/25/2014  . Hyperlipidemia 12/14/2013  . Hypertension 12/14/2013  . Vitamin D deficiency 12/14/2013  . Scoliosis (and kyphoscoliosis), idiopathic 12/14/2013    RAMSEUR,CHRIS, PTA 08/07/2015, 10:48 AM  [facility] [address] [place], [place], [address] Phone: [phone]   Fax:  [phone]

## 2015-08-09 ENCOUNTER — Encounter: Payer: Self-pay | Admitting: *Deleted

## 2015-08-09 ENCOUNTER — Ambulatory Visit: Payer: Medicare Other | Admitting: *Deleted

## 2015-08-09 DIAGNOSIS — M25472 Effusion, left ankle: Secondary | ICD-10-CM

## 2015-08-09 DIAGNOSIS — M25572 Pain in left ankle and joints of left foot: Secondary | ICD-10-CM | POA: Diagnosis not present

## 2015-08-09 DIAGNOSIS — M25672 Stiffness of left ankle, not elsewhere classified: Secondary | ICD-10-CM

## 2015-08-09 NOTE — Therapy (Signed)
[facility] [place], [place], [address] Phone: [phone]   Fax:  [phone]  Physical Therapy Treatment  Patient Details  Name: Melissa Hall MRN: [id number] Date of Birth: 24-Feb-1928 Referring Provider:  Chipper Herb, MD  Encounter Date: 08/09/2015      PT End of Session - 08/09/15 0952    Visit Number 8   Number of Visits 12   Date for PT Re-Evaluation 08/21/15   PT Start Time 0945   PT Stop Time 8242   PT Time Calculation (min) 50 min      Past Medical History  Diagnosis Date  . Hypertension   . Arthritis   . High cholesterol   . GERD (gastroesophageal reflux disease)   . Osteoporosis   . Cataract     Past Surgical History  Procedure Laterality Date  . Joint replacement      bilat  . Back surgery    . Cataract extraction w/phaco  10/18/2012    Procedure: CATARACT EXTRACTION PHACO AND INTRAOCULAR LENS PLACEMENT (IOC);  Surgeon: Tonny Branch, MD;  Location: [facility];  Service: Ophthalmology;  Laterality: Left;  CDE:17.69  . Colonoscopy    . Tonsillectomy    . Orif humerus fracture Left 05/12/2014    Procedure: OPEN REDUCTION INTERNAL FIXATION (ORIF) PROXIMAL HUMERUS FRACTURE;  Surgeon: [name], MD;  Location: [place];  Service: Orthopedics;  Laterality: Left;  Open Reduction Internal Fixation Left Proximal Humerus  . Eye surgery    . Abdominal hysterectomy    . Appendectomy      There were no vitals filed for this visit.  Visit Diagnosis:  Left ankle pain  Left ankle swelling  Ankle stiffness, left      Subjective Assessment - 08/09/15 0950    Subjective 80% better in leg no better in foot per patient,  Not as sore and less swelling   Limitations Walking   How long can you walk comfortably? 20 minutes.   Patient Stated Goals Get rid of my left ankle pain and swelling.   Pain Score 3    Pain Location Ankle   Pain Orientation Left   Pain Descriptors / Indicators Aching   Pain Type  Chronic pain   Pain Onset More than a month ago   Pain Frequency Intermittent   Aggravating Factors  Walking   Pain Relieving Factors rest, PT Rxs                         [facility] Adult PT Treatment/Exercise - 08/09/15 0001    Electrical Stimulation   Electrical Stimulation Location L Tib Ant/ Ankle   Electrical Stimulation Action IFC   Electrical Stimulation Goals Pain;Edema   Vasopneumatic   Number Minutes Vasopneumatic  15 minutes   Vasopnuematic Location  Ankle   Vasopneumatic Pressure Medium   Vasopneumatic Temperature  34   Manual Therapy   Manual Therapy Passive ROM;Myofascial release   Myofascial Release STW to L Tib. Anterior, Evertors, and soleus to decrease tightness   Passive ROM Passive L gastroc/ soleus/ Tib Ant/ Evertors stretch in sitting 3 x30 seconds                  PT Short Term Goals - 08/09/15 1021    PT SHORT TERM GOAL #1   Title Ind with a HEP.   Period Weeks   Status Achieved           PT Long  Term Goals - Aug 28, 2015 1016    PT LONG TERM GOAL #1   Title Patient walk a community distance with left ankle pain not > 3/10.   Time 6   Period Weeks   Status Achieved   PT LONG TERM GOAL #2   Title Perform ADL's with pain not greater than 3/10.   Time 6   Period Weeks   Status Achieved   PT LONG TERM GOAL #3   Title Active left dorsiflexion= 15 degrees with left knee flexed.   Time 6   Period Weeks   Status Not Met               Plan - 08/28/15 1021    Clinical Impression Statement Pt feels that she is 80-90% better and has met majority of goals. DF goal NM due to tightness. Goal was 15 degrees and she has 10 degrees of active and 15 with PROM   Pt will benefit from skilled therapeutic intervention in order to improve on the following deficits Pain;Decreased activity tolerance   Rehab Potential Good   PT Frequency 2x / week   PT Duration 6 weeks   PT Treatment/Interventions ADLs/Self Care Home  Management;Cryotherapy;Air traffic controller;Therapeutic activities;Therapeutic exercise;Patient/family education;Manual techniques;Passive range of motion;Vasopneumatic Device   PT Next Visit Plan DC to HEP as per Pt.          G-Codes - 2015-08-28 1142    Functional Assessment Tool Used FOTO 8th visit  41% limited,       DC G-code     Functional Limitation Mobility: Walking and moving around   Mobility: Walking and Moving Around Current Status [phone]) At least 40 percent but less than 60 percent impaired, limited or restricted   Mobility: Walking and Moving Around Goal Status [phone]) At least 40 percent but less than 60 percent impaired, limited or restricted   Mobility: Walking and Moving Around Discharge Status [phone]) At least 40 percent but less than 60 percent impaired, limited or restricted      Problem List Patient Active Problem List   Diagnosis Date Noted  . Fracture of proximal end of left humerus 05/13/2014  . Traumatic closed displaced fracture of proximal end of left humerus 05/12/2014    Class: Acute  . Proximal humerus fracture 05/12/2014  . Osteoporosis, post-menopausal 01/25/2014  . Hyperlipidemia 12/14/2013  . Hypertension 12/14/2013  . Vitamin D deficiency 12/14/2013  . Scoliosis (and kyphoscoliosis), idiopathic 12/14/2013  PHYSICAL THERAPY DISCHARGE SUMMARY  Visits from Start of Care: 8  Current functional level related to goals / functional outcomes: Please see above.   Remaining deficits: Goal number 3 not met.   Education / Equipment: HEP.  Plan: Patient agrees to discharge.  Patient goals were partially met. Patient is being discharged due to meeting the stated rehab goals.  ?????       Hodges Treiber, [place] MPT 08/28/15, 11:42 AM  [facility] [address] [place], [place], [address] Phone: [phone]   Fax:  [phone]

## 2015-08-09 NOTE — Therapy (Signed)
[facility] [place], [place], [address] Phone: [phone]   Fax:  [phone]  Physical Therapy Treatment  Patient Details  Name: Melissa Hall MRN: [id number] Date of Birth: 04/22/1928 Referring Provider:  Chipper Herb, MD  Encounter Date: 08/09/2015      PT End of Session - 08/09/15 0952    Visit Number 8   Number of Visits 12   Date for PT Re-Evaluation 08/21/15   PT Start Time 0945   PT Stop Time 9485   PT Time Calculation (min) 50 min      Past Medical History  Diagnosis Date  . Hypertension   . Arthritis   . High cholesterol   . GERD (gastroesophageal reflux disease)   . Osteoporosis   . Cataract     Past Surgical History  Procedure Laterality Date  . Joint replacement      bilat  . Back surgery    . Cataract extraction w/phaco  10/18/2012    Procedure: CATARACT EXTRACTION PHACO AND INTRAOCULAR LENS PLACEMENT (IOC);  Surgeon: Tonny Branch, MD;  Location: [facility];  Service: Ophthalmology;  Laterality: Left;  CDE:17.69  . Colonoscopy    . Tonsillectomy    . Orif humerus fracture Left 05/12/2014    Procedure: OPEN REDUCTION INTERNAL FIXATION (ORIF) PROXIMAL HUMERUS FRACTURE;  Surgeon: [name], MD;  Location: [facility];  Service: Orthopedics;  Laterality: Left;  Open Reduction Internal Fixation Left Proximal Humerus  . Eye surgery    . Abdominal hysterectomy    . Appendectomy      There were no vitals filed for this visit.  Visit Diagnosis:  Left ankle pain  Left ankle swelling  Ankle stiffness, left      Subjective Assessment - 08/09/15 0950    Subjective 80% better in leg no better in foot per patient,  Not as sore and less swelling   Limitations Walking   How long can you walk comfortably? 20 minutes.   Patient Stated Goals Get rid of my left ankle pain and swelling.   Pain Score 3    Pain Location Ankle   Pain Orientation Left   Pain Descriptors / Indicators Aching   Pain Type  Chronic pain   Pain Onset More than a month ago   Pain Frequency Intermittent   Aggravating Factors  Walking   Pain Relieving Factors rest, PT Rxs                         [facility] Adult PT Treatment/Exercise - 08/09/15 0001    Electrical Stimulation   Electrical Stimulation Location L Tib Ant/ Ankle   Electrical Stimulation Action IFC   Electrical Stimulation Goals Pain;Edema   Vasopneumatic   Number Minutes Vasopneumatic  15 minutes   Vasopnuematic Location  Ankle   Vasopneumatic Pressure Medium   Vasopneumatic Temperature  34   Manual Therapy   Manual Therapy Passive ROM;Myofascial release   Myofascial Release STW to L Tib. Anterior, Evertors, and soleus to decrease tightness   Passive ROM Passive L gastroc/ soleus/ Tib Ant/ Evertors stretch in sitting 3 x30 seconds  IASTM to entire calf and evertors              PT Short Term Goals - 08-24-15 1021    PT SHORT TERM GOAL #1   Title Ind with a HEP.   Period Weeks   Status Achieved           PT Long Term Goals - Aug 24, 2015 1016    PT LONG TERM GOAL #1   Title Patient walk a community distance with left ankle pain not > 3/10.   Time 6   Period Weeks   Status Achieved   PT LONG TERM GOAL #2   Title Perform ADL's with pain not greater than 3/10.   Time 6   Period Weeks   Status Achieved   PT LONG TERM GOAL #3   Title Active left dorsiflexion= 15 degrees with left knee flexed.   Time 6   Period Weeks   Status Not Met               Plan - 08/24/2015 1021    Clinical Impression Statement Pt feels that she is 80-90% better and has met majority of goals. DF goal NM due to tightness. Goal was 15 degrees and she has 10 degrees of active and 15 with PROM   Pt will benefit from skilled therapeutic intervention in order to improve on the following deficits Pain;Decreased activity tolerance   Rehab  Potential Good   PT Frequency 2x / week   PT Duration 6 weeks   PT Treatment/Interventions ADLs/Self Care Home Management;Cryotherapy;Air traffic controller;Therapeutic activities;Therapeutic exercise;Patient/family education;Manual techniques;Passive range of motion;Vasopneumatic Device   PT Next Visit Plan DC to HEP as per Pt.          G-Codes - 24-Aug-2015 1038    Functional Assessment Tool Used FOTO 8th visit  41% limited,       DC G-code        Problem List Patient Active Problem List   Diagnosis Date Noted  . Fracture of proximal end of left humerus 05/13/2014  . Traumatic closed displaced fracture of proximal end of left humerus 05/12/2014    Class: Acute  . Proximal humerus fracture 05/12/2014  . Osteoporosis, post-menopausal 01/25/2014  . Hyperlipidemia 12/14/2013  . Hypertension 12/14/2013  . Vitamin D deficiency 12/14/2013  . Scoliosis (and kyphoscoliosis), idiopathic 12/14/2013    RAMSEUR,CHRIS, PTA 08/24/2015, 10:52 AM  [facility] [address] [place], [place], [address] Phone: [phone]   Fax:  [phone]

## 2015-08-24 ENCOUNTER — Other Ambulatory Visit: Payer: Self-pay | Admitting: Pharmacist

## 2015-09-26 ENCOUNTER — Ambulatory Visit: Payer: Medicare Other | Admitting: Family Medicine

## 2015-09-27 ENCOUNTER — Ambulatory Visit: Payer: Medicare Other | Admitting: Family Medicine

## 2015-10-09 ENCOUNTER — Encounter: Payer: Self-pay | Admitting: Family Medicine

## 2015-10-09 ENCOUNTER — Ambulatory Visit (INDEPENDENT_AMBULATORY_CARE_PROVIDER_SITE_OTHER): Payer: Medicare Other | Admitting: Family Medicine

## 2015-10-09 VITALS — BP 133/81 | HR 75 | Temp 97.5°F | Ht 58.5 in | Wt 136.0 lb

## 2015-10-09 DIAGNOSIS — E559 Vitamin D deficiency, unspecified: Secondary | ICD-10-CM | POA: Diagnosis not present

## 2015-10-09 DIAGNOSIS — I10 Essential (primary) hypertension: Secondary | ICD-10-CM | POA: Diagnosis not present

## 2015-10-09 DIAGNOSIS — E785 Hyperlipidemia, unspecified: Secondary | ICD-10-CM | POA: Diagnosis not present

## 2015-10-09 NOTE — Progress Notes (Signed)
Subjective:    Patient ID: Melissa Hall, female    DOB: January 10, 1928, 81 y.o.   MRN: [id number]  HPI Pt here for follow up and management of chronic medical problems which includes hyperlipidemia and hypertension. She is taking medications regularly. The patient is doing well with no specific complaints. She is due to get lab work today and will be given an FOBT to return. She is had her flu shot Prevnar vaccine already. The patient is pleasant and alert. She denies chest pain shortness of breath trouble swallowing heartburn indigestion nausea vomiting or diarrhea. She is passing her water without problems. She uses her cane for ambulation. She is careful to watch her she is going and to not be in a hurry so she does not fall. She still has limited range of motion of left shoulder but it could be worse.      Patient Active Problem List   Diagnosis Date Noted  . Fracture of proximal end of left humerus 05/13/2014  . Traumatic closed displaced fracture of proximal end of left humerus 05/12/2014    Class: Acute  . Proximal humerus fracture 05/12/2014  . Osteoporosis, post-menopausal 01/25/2014  . Hyperlipidemia 12/14/2013  . Hypertension 12/14/2013  . Vitamin D deficiency 12/14/2013  . Scoliosis (and kyphoscoliosis), idiopathic 12/14/2013   Outpatient Encounter Prescriptions as of 10/09/2015  Medication Sig  . alendronate (FOSAMAX) 70 MG tablet Take 1 tablet (70 mg total) by mouth every 7 (seven) days. Take with a full glass of water on an empty stomach.  Marland Kitchen aspirin EC 81 MG tablet Take 81 mg by mouth daily.  . calcium citrate-vitamin D (CITRACAL+D) 315-200 MG-UNIT per tablet Take 1 tablet by mouth 3 (three) times daily.  . cholecalciferol (VITAMIN D) 1000 UNITS tablet Take 3,000 Units by mouth daily.   . hydrochlorothiazide (MICROZIDE) 12.5 MG capsule TAKE (1) CAPSULE DAILY  . Multiple Vitamins-Minerals (MULTIVITAMIN WITH MINERALS) tablet Take 1 tablet by mouth daily.  . pravastatin  (PRAVACHOL) 10 MG tablet TAKE 1 TABLET DAILY   No facility-administered encounter medications on file as of 10/09/2015.      Review of Systems  Constitutional: Negative.   HENT: Negative.   Eyes: Negative.   Respiratory: Negative.   Cardiovascular: Negative.   Gastrointestinal: Negative.   Endocrine: Negative.   Genitourinary: Negative.   Musculoskeletal: Negative.   Skin: Negative.   Allergic/Immunologic: Negative.   Neurological: Negative.   Hematological: Negative.   Psychiatric/Behavioral: Negative.        Objective:   Physical Exam  Constitutional: She is oriented to person, place, and time. She appears well-nourished. No distress.  Elderly but alert  HENT:  Head: Normocephalic and atraumatic.  Right Ear: External ear normal.  Left Ear: External ear normal.  Nose: Nose normal.  Mouth/Throat: Oropharynx is clear and moist.  Eyes: Conjunctivae and EOM are normal. Pupils are equal, round, and reactive to light. Right eye exhibits no discharge. Left eye exhibits no discharge. No scleral icterus.  Neck: Normal range of motion. Neck supple. No thyromegaly present.  No carotid bruits or thyromegaly  Cardiovascular: Normal rate, regular rhythm, normal heart sounds and intact distal pulses.   No murmur heard. Heart has a regular rate and rhythm at 72/m  Pulmonary/Chest: Effort normal and breath sounds normal. No respiratory distress. She has no wheezes. She has no rales. She exhibits no tenderness.  Clear anteriorly and posteriorly  Abdominal: Soft. Bowel sounds are normal. She exhibits no mass. There is no tenderness. There is  no rebound and no guarding.  Musculoskeletal: She exhibits no edema or tenderness.  She has limited range of motion of the left shoulder and arm due to her recent fracture. The patient has a somewhat hesitant gait and uses the cane for support and ambulation.  Lymphadenopathy:    She has no cervical adenopathy.  Neurological: She is alert and  oriented to person, place, and time. She has normal reflexes. No cranial nerve deficit.  Skin: Skin is warm and dry. No rash noted.  Psychiatric: She has a normal mood and affect. Her behavior is normal. Judgment and thought content normal.  Nursing note and vitals reviewed.  BP 133/81 mmHg  Pulse 75  Temp(Src) 97.5 F (36.4 C) (Oral)  Ht 4' 10.5" (1.486 m)  Wt 136 lb (61.689 kg)  BMI 27.94 kg/m2        Assessment & Plan:  1. Hyperlipidemia -Continue current treatment pending results of lab work - CBC with Differential/Platelet - Lipid panel  2. Essential hypertension -The blood pressure is good today and she will continue with current treatment - BMP8+EGFR - CBC with Differential/Platelet - Hepatic function panel  3. Vitamin D deficiency -Continue current treatment pending results of lab work - CBC with Differential/Platelet - Vit D  25 hydroxy (rtn osteoporosis monitoring)  Patient Instructions                       Medicare Annual Wellness Visit  [facility] and the medical providers at [facility] strive to bring you the best medical care.  In doing so we not only want to address your current medical conditions and concerns but also to detect new conditions early and prevent illness, disease and health-related problems.    Medicare offers a yearly Wellness Visit which allows our clinical staff to assess your need for preventative services including immunizations, lifestyle education, counseling to decrease risk of preventable diseases and screening for fall risk and other medical concerns.    This visit is provided free of charge (no copay) for all Medicare recipients. The clinical pharmacists at [facility] have begun to conduct these Wellness Visits which will also include a thorough review of all your medications.    As you primary medical provider recommend that you make an appointment for your Annual Wellness Visit if  you have not done so already this year.  You may set up this appointment before you leave today or you may call back ([phone]) and schedule an appointment.  Please make sure when you call that you mention that you are scheduling your Annual Wellness Visit with the clinical pharmacist so that the appointment may be made for the proper length of time.     Continue current medications. Continue good therapeutic lifestyle changes which include good diet and exercise. Fall precautions discussed with patient. If an FOBT was given today- please return it to our front desk. If you are over 29 years old - you may need Prevnar 48 or the adult Pneumonia vaccine.  **Flu shots are available--- please call and schedule a FLU-CLINIC appointment**  After your visit with [place] today you will receive a survey in the mail or online from [employer] regarding your care with [place]. Please take a moment to fill this out. Your feedback is very important to [place] as you can help [place] better understand your patient needs as well as improve your experience and satisfaction. WE CARE ABOUT YOU!!!   The  patient should continue to use her cane for ambulation She should move slowly and watch closely where she is going This winter she should drink plenty of fluids use nasal saline to stay moisturized and her nasal passages and take Mucinex if needed for cough and congestion She should keep the house as cool as possible Return the FOBT We will call with lab work as soon as it becomes available   Arrie Senate MD

## 2015-10-09 NOTE — Patient Instructions (Addendum)
Medicare Annual Wellness Visit  [facility] and the medical providers at [facility] strive to bring you the best medical care.  In doing so we not only want to address your current medical conditions and concerns but also to detect new conditions early and prevent illness, disease and health-related problems.    Medicare offers a yearly Wellness Visit which allows our clinical staff to assess your need for preventative services including immunizations, lifestyle education, counseling to decrease risk of preventable diseases and screening for fall risk and other medical concerns.    This visit is provided free of charge (no copay) for all Medicare recipients. The clinical pharmacists at [facility] have begun to conduct these Wellness Visits which will also include a thorough review of all your medications.    As you primary medical provider recommend that you make an appointment for your Annual Wellness Visit if you have not done so already this year.  You may set up this appointment before you leave today or you may call back [phone] and schedule an appointment.  Please make sure when you call that you mention that you are scheduling your Annual Wellness Visit with the clinical pharmacist so that the appointment may be made for the proper length of time.     Continue current medications. Continue good therapeutic lifestyle changes which include good diet and exercise. Fall precautions discussed with patient. If an FOBT was given today- please return it to our front desk. If you are over 79 years old - you may need Prevnar 13 or the adult Pneumonia vaccine.  **Flu shots are available--- please call and schedule a FLU-CLINIC appointment**  After your visit with us today you will receive a survey in the mail or online from [employer] regarding your care with us. Please take a moment to fill this out. Your feedback is very  important to us as you can help us better understand your patient needs as well as improve your experience and satisfaction. WE CARE ABOUT YOU!!!   The patient should continue to use her cane for ambulation She should move slowly and watch closely where she is going This winter she should drink plenty of fluids use nasal saline to stay moisturized and her nasal passages and take Mucinex if needed for cough and congestion She should keep the house as cool as possible Return the FOBT We will call with lab work as soon as it becomes available

## 2015-10-10 LAB — CBC WITH DIFFERENTIAL/PLATELET
BASOS ABS: 0 10*3/uL (ref 0.0–0.2)
Basos: 0 %
EOS (ABSOLUTE): 0.1 10*3/uL (ref 0.0–0.4)
Eos: 1 %
HEMATOCRIT: 39.1 % (ref 34.0–46.6)
HEMOGLOBIN: 12.7 g/dL (ref 11.1–15.9)
IMMATURE GRANS (ABS): 0 10*3/uL (ref 0.0–0.1)
Immature Granulocytes: 0 %
Lymphocytes Absolute: 1.7 10*3/uL (ref 0.7–3.1)
Lymphs: 33 %
MCH: 30.8 pg (ref 26.6–33.0)
MCHC: 32.5 g/dL (ref 31.5–35.7)
MCV: 95 fL (ref 79–97)
MONOS ABS: 0.6 10*3/uL (ref 0.1–0.9)
Monocytes: 11 %
NEUTROS ABS: 2.8 10*3/uL (ref 1.4–7.0)
Neutrophils: 55 %
PLATELETS: 208 10*3/uL (ref 150–379)
RBC: 4.13 x10E6/uL (ref 3.77–5.28)
RDW: 13.1 % (ref 12.3–15.4)
WBC: 5.2 10*3/uL (ref 3.4–10.8)

## 2015-10-10 LAB — LIPID PANEL
CHOL/HDL RATIO: 2.7 ratio (ref 0.0–4.4)
Cholesterol, Total: 173 mg/dL (ref 100–199)
HDL: 65 mg/dL (ref >39)
LDL Calculated: 93 mg/dL (ref 0–99)
TRIGLYCERIDES: 74 mg/dL (ref 0–149)
VLDL Cholesterol Cal: 15 mg/dL (ref 5–40)

## 2015-10-10 LAB — BMP8+EGFR
BUN/Creatinine Ratio: 18 (ref 11–26)
BUN: 10 mg/dL (ref 8–27)
CO2: 31 mmol/L — ABNORMAL HIGH (ref 18–29)
Calcium: 9.9 mg/dL (ref 8.7–10.3)
Chloride: 101 mmol/L (ref 97–106)
Creatinine, Ser: 0.57 mg/dL (ref 0.57–1.00)
GFR calc non Af Amer: 85 mL/min/{1.73_m2} (ref >59)
GFR, EST AFRICAN AMERICAN: 98 mL/min/{1.73_m2} (ref >59)
Glucose: 97 mg/dL (ref 65–99)
POTASSIUM: 3.8 mmol/L (ref 3.5–5.2)
SODIUM: 145 mmol/L — AB (ref 136–144)

## 2015-10-10 LAB — HEPATIC FUNCTION PANEL
ALK PHOS: 59 IU/L (ref 39–117)
ALT: 16 IU/L (ref 0–32)
AST: 27 IU/L (ref 0–40)
Albumin: 4.2 g/dL (ref 3.5–4.7)
BILIRUBIN TOTAL: 0.3 mg/dL (ref 0.0–1.2)
Bilirubin, Direct: 0.13 mg/dL (ref 0.00–0.40)
Total Protein: 6.2 g/dL (ref 6.0–8.5)

## 2015-10-10 LAB — VITAMIN D 25 HYDROXY (VIT D DEFICIENCY, FRACTURES): Vit D, 25-Hydroxy: 58.4 ng/mL (ref 30.0–100.0)

## 2015-11-16 ENCOUNTER — Other Ambulatory Visit: Payer: Self-pay | Admitting: Family Medicine

## 2015-11-26 ENCOUNTER — Other Ambulatory Visit: Payer: Self-pay | Admitting: Family Medicine

## 2016-01-10 DIAGNOSIS — H40033 Anatomical narrow angle, bilateral: Secondary | ICD-10-CM | POA: Diagnosis not present

## 2016-01-10 DIAGNOSIS — H2513 Age-related nuclear cataract, bilateral: Secondary | ICD-10-CM | POA: Diagnosis not present

## 2016-02-01 ENCOUNTER — Encounter: Payer: Self-pay | Admitting: Family Medicine

## 2016-02-01 ENCOUNTER — Ambulatory Visit (INDEPENDENT_AMBULATORY_CARE_PROVIDER_SITE_OTHER): Payer: Medicare Other

## 2016-02-01 ENCOUNTER — Ambulatory Visit (INDEPENDENT_AMBULATORY_CARE_PROVIDER_SITE_OTHER): Payer: Medicare Other | Admitting: Family Medicine

## 2016-02-01 VITALS — BP 116/74 | HR 79 | Temp 97.4°F | Ht 58.5 in | Wt 135.0 lb

## 2016-02-01 DIAGNOSIS — E785 Hyperlipidemia, unspecified: Secondary | ICD-10-CM

## 2016-02-01 DIAGNOSIS — Z13820 Encounter for screening for osteoporosis: Secondary | ICD-10-CM | POA: Diagnosis not present

## 2016-02-01 DIAGNOSIS — I10 Essential (primary) hypertension: Secondary | ICD-10-CM

## 2016-02-01 DIAGNOSIS — M810 Age-related osteoporosis without current pathological fracture: Secondary | ICD-10-CM | POA: Diagnosis not present

## 2016-02-01 DIAGNOSIS — Z780 Asymptomatic menopausal state: Secondary | ICD-10-CM

## 2016-02-01 DIAGNOSIS — E559 Vitamin D deficiency, unspecified: Secondary | ICD-10-CM | POA: Diagnosis not present

## 2016-02-01 NOTE — Progress Notes (Signed)
Subjective:    Patient ID: Melissa Hall, female    DOB: May 09, 1928, 82 y.o.   MRN: [id number]  HPI Pt here for follow up and management of chronic medical problems which includes hyperlipidemia and hypertension. She is taking medications regularly. The patient comes in today with no specific complaints. She is due to get a DEXA scan and this is going to be done today. She is also due to get lab work and return an FOBT. The patient is pleasant and alert. Her DEXA report was given to me during the visit and we will reviewed the results and all other parameters are worse and pushing her more intensive osteoporosis with her spine and both hips. She's been on alendronate for years and we will set her up an appointment to visit with the clinical pharmacist to look at other options for treating her osteoporosis. She says she is careful and does not climb. She she denies any chest pain shortness of breath trouble swallowing and heartburn indigestion nausea vomiting diarrhea or blood in the stool. She is passing her water without problems and uses a cane for walking.      Patient Active Problem List   Diagnosis Date Noted  . Fracture of proximal end of left humerus 05/13/2014  . Traumatic closed displaced fracture of proximal end of left humerus 05/12/2014    Class: Acute  . Proximal humerus fracture 05/12/2014  . Osteoporosis, post-menopausal 01/25/2014  . Hyperlipidemia 12/14/2013  . Hypertension 12/14/2013  . Vitamin D deficiency 12/14/2013  . Scoliosis (and kyphoscoliosis), idiopathic 12/14/2013   Outpatient Encounter Prescriptions as of 02/01/2016  Medication Sig  . alendronate (FOSAMAX) 70 MG tablet Take 1 tablet (70 mg total) by mouth every 7 (seven) days. Take with a full glass of water on an empty stomach.  Marland Kitchen aspirin EC 81 MG tablet Take 81 mg by mouth daily.  . calcium citrate-vitamin D (CITRACAL+D) 315-200 MG-UNIT per tablet Take 1 tablet by mouth 3 (three) times daily.  .  cholecalciferol (VITAMIN D) 1000 UNITS tablet Take 3,000 Units by mouth daily.   . hydrochlorothiazide (MICROZIDE) 12.5 MG capsule TAKE (1) CAPSULE DAILY  . Multiple Vitamins-Minerals (MULTIVITAMIN WITH MINERALS) tablet Take 1 tablet by mouth daily.  . pravastatin (PRAVACHOL) 10 MG tablet TAKE 1 TABLET DAILY   No facility-administered encounter medications on file as of 02/01/2016.      Review of Systems  Constitutional: Negative.   HENT: Negative.   Eyes: Negative.   Respiratory: Negative.   Cardiovascular: Negative.   Gastrointestinal: Negative.   Endocrine: Negative.   Genitourinary: Negative.   Musculoskeletal: Positive for arthralgias.  Skin: Negative.   Allergic/Immunologic: Negative.   Neurological: Negative.   Hematological: Negative.   Psychiatric/Behavioral: Negative.        Objective:   Physical Exam  Constitutional: She is oriented to person, place, and time. No distress.  Using a cane for ambulation. Elderly and kyphotic but alert  HENT:  Head: Normocephalic and atraumatic.  Right Ear: External ear normal.  Left Ear: External ear normal.  Nose: Nose normal.  Mouth/Throat: Oropharynx is clear and moist.  Eyes: Conjunctivae and EOM are normal. Pupils are equal, round, and reactive to light. Right eye exhibits no discharge. Left eye exhibits no discharge. No scleral icterus.  Neck: Normal range of motion. Neck supple. No JVD present. No thyromegaly present.  Cardiovascular: Normal rate, regular rhythm and normal heart sounds.   No murmur heard. Pulmonary/Chest: Effort normal and breath sounds normal. No respiratory  distress. She has no wheezes. She has no rales. She exhibits no tenderness.  Abdominal: Soft. Bowel sounds are normal. She exhibits no mass. There is no tenderness. There is no rebound and no guarding.  Musculoskeletal: She exhibits no edema or tenderness.  Hesitancy with walking due to kyphotic posture  Lymphadenopathy:    She has no cervical  adenopathy.  Neurological: She is alert and oriented to person, place, and time.  Skin: Skin is warm and dry. No rash noted.  Psychiatric: She has a normal mood and affect. Her behavior is normal. Judgment and thought content normal.  Nursing note and vitals reviewed.   BP 116/74 mmHg  Pulse 79  Temp(Src) 97.4 F (36.3 C) (Oral)  Ht 4' 10.5" (1.486 m)  Wt 135 lb (61.236 kg)  BMI 27.73 kg/m2  DEXA scan was reviewed with patient she was given a copy this report to bring back with her visit to the clinical pharmacist to change to different treatment options.     Assessment & Plan:  1. Hyperlipidemia -Continue current treatment and diet regimen pending results of lab work - CBC with Differential/Platelet - Lipid panel  2. Essential hypertension -Blood pressure is good today and she should continue with current treatment - BMP8+EGFR - CBC with Differential/Platelet - Hepatic function panel  3. Vitamin D deficiency -Continue with current treatment and calcium replacement pending results of lab work - CBC with Differential/Platelet - VITAMIN D 25 Hydroxy (Vit-D Deficiency, Fractures) - DG Bone Density; Future  4. Screening for osteoporosis -The DEXA scan today revealed decreasing bone mineral density. -She will return to the office for a visit with the clinical pharmacist to discuss other options of managing this. - DG Bone Density; Future  5. Postmenopausal *-Continue with fall prevention. - DG Bone Density; Future  6. Osteoporosis, post-menopausal -Worsening osteoporosis and we'll consider other options for treatment.  Patient Instructions                       Medicare Annual Wellness Visit  [facility] and the medical providers at [facility] strive to bring you the best medical care.  In doing so we not only want to address your current medical conditions and concerns but also to detect new conditions early and prevent illness, disease and  health-related problems.    Medicare offers a yearly Wellness Visit which allows our clinical staff to assess your need for preventative services including immunizations, lifestyle education, counseling to decrease risk of preventable diseases and screening for fall risk and other medical concerns.    This visit is provided free of charge (no copay) for all Medicare recipients. The clinical pharmacists at [facility] have begun to conduct these Wellness Visits which will also include a thorough review of all your medications.    As you primary medical provider recommend that you make an appointment for your Annual Wellness Visit if you have not done so already this year.  You may set up this appointment before you leave today or you may call back ([phone]) and schedule an appointment.  Please make sure when you call that you mention that you are scheduling your Annual Wellness Visit with the clinical pharmacist so that the appointment may be made for the proper length of time.     Continue current medications. Continue good therapeutic lifestyle changes which include good diet and exercise. Fall precautions discussed with patient. If an FOBT was given today- please return it  to our front desk. If you are over 81 years old - you may need Prevnar 17 or the adult Pneumonia vaccine.  **Flu shots are available--- please call and schedule a FLU-CLINIC appointment**  After your visit with [place] today you will receive a survey in the mail or online from [employer] regarding your care with [place]. Please take a moment to fill this out. Your feedback is very important to [place] as you can help [place] better understand your patient needs as well as improve your experience and satisfaction. WE CARE ABOUT YOU!!!   Continue to be careful do not put yourself at risk for falling We will make arrangements for you to see the clinical pharmacist to discuss other options for managing her  osteoporosis Continue to drink plenty of fluids and stay well hydrated   Arrie Senate MD

## 2016-02-01 NOTE — Patient Instructions (Addendum)
Medicare Annual Wellness Visit  [place] and the medical providers at [facility] strive to bring you the best medical care.  In doing so we not only want to address your current medical conditions and concerns but also to detect new conditions early and prevent illness, disease and health-related problems.    Medicare offers a yearly Wellness Visit which allows our clinical staff to assess your need for preventative services including immunizations, lifestyle education, counseling to decrease risk of preventable diseases and screening for fall risk and other medical concerns.    This visit is provided free of charge (no copay) for all Medicare recipients. The clinical pharmacists at [facility] have begun to conduct these Wellness Visits which will also include a thorough review of all your medications.    As you primary medical provider recommend that you make an appointment for your Annual Wellness Visit if you have not done so already this year.  You may set up this appointment before you leave today or you may call back ([phone]) and schedule an appointment.  Please make sure when you call that you mention that you are scheduling your Annual Wellness Visit with the clinical pharmacist so that the appointment may be made for the proper length of time.     Continue current medications. Continue good therapeutic lifestyle changes which include good diet and exercise. Fall precautions discussed with patient. If an FOBT was given today- please return it to our front desk. If you are over 79 years old - you may need Prevnar 13 or the adult Pneumonia vaccine.  **Flu shots are available--- please call and schedule a FLU-CLINIC appointment**  After your visit with [place] today you will receive a survey in the mail or online from [employer] regarding your care with [place]. Please take a moment to fill this out. Your feedback is very  important to [place] as you can help [place] better understand your patient needs as well as improve your experience and satisfaction. WE CARE ABOUT YOU!!!   Continue to be careful do not put yourself at risk for falling We will make arrangements for you to see the clinical pharmacist to discuss other options for managing her osteoporosis Continue to drink plenty of fluids and stay well hydrated

## 2016-02-11 ENCOUNTER — Ambulatory Visit (INDEPENDENT_AMBULATORY_CARE_PROVIDER_SITE_OTHER): Payer: Medicare Other | Admitting: Pharmacist

## 2016-02-11 VITALS — BP 120/72 | HR 75 | Ht 59.0 in | Wt 135.5 lb

## 2016-02-11 DIAGNOSIS — M810 Age-related osteoporosis without current pathological fracture: Secondary | ICD-10-CM | POA: Diagnosis not present

## 2016-02-11 DIAGNOSIS — Z1212 Encounter for screening for malignant neoplasm of rectum: Secondary | ICD-10-CM | POA: Diagnosis not present

## 2016-02-11 NOTE — Patient Instructions (Signed)
I am checking into Prolia (an injection for your bones that is given every 6 months)  Fall Prevention in the Home  Falls can cause injuries and can affect people from all age groups. There are many simple things that you can do to make your home safe and to help prevent falls. WHAT CAN I DO ON THE OUTSIDE OF MY HOME?  Regularly repair the edges of walkways and driveways and fix any cracks.  Remove high doorway thresholds.  Trim any shrubbery on the main path into your home.  Use bright outdoor lighting.  Clear walkways of debris and clutter, including tools and rocks.  Regularly check that handrails are securely fastened and in good repair. Both sides of any steps should have handrails.  Install guardrails along the edges of any raised decks or porches.  Have leaves, snow, and ice cleared regularly.  Use sand or salt on walkways during winter months.  In the garage, clean up any spills right away, including grease or oil spills. WHAT CAN I DO IN THE BATHROOM?  Use night lights.  Install grab bars by the toilet and in the tub and shower. Do not use towel bars as grab bars.  Use non-skid mats or decals on the floor of the tub or shower.  If you need to sit down while you are in the shower, use a plastic, non-slip stool.[place]  Keep the floor dry. Immediately clean up any water that spills on the floor.  Remove soap buildup in the tub or shower on a regular basis.  Attach bath mats securely with double-sided non-slip rug tape.  Remove throw rugs and other tripping hazards from the floor. WHAT CAN I DO IN THE BEDROOM?  Use night lights.  Make sure that a bedside light is easy to reach.  Do not use oversized bedding that drapes onto the floor.  Have a firm chair that has side arms to use for getting dressed.  Remove throw rugs and other tripping hazards from the floor. WHAT CAN I DO IN THE KITCHEN?   Clean up any spills right away.  Avoid walking on wet floors.  Place  frequently used items in easy-to-reach places.  If you need to reach for something above you, use a sturdy step stool that has a grab bar.  Keep electrical cables out of the way.  Do not use floor polish or wax that makes floors slippery. If you have to use wax, make sure that it is non-skid floor wax.  Remove throw rugs and other tripping hazards from the floor. WHAT CAN I DO IN THE STAIRWAYS?  Do not leave any items on the stairs.  Make sure that there are handrails on both sides of the stairs. Fix handrails that are broken or loose. Make sure that handrails are as long as the stairways.  Check any carpeting to make sure that it is firmly attached to the stairs. Fix any carpet that is loose or worn.  Avoid having throw rugs at the top or bottom of stairways, or secure the rugs with carpet tape to prevent them from moving.  Make sure that you have a light switch at the top of the stairs and the bottom of the stairs. If you do not have them, have them installed. WHAT ARE SOME OTHER FALL PREVENTION TIPS?  Wear closed-toe shoes that fit well and support your feet. Wear shoes that have rubber soles or low heels.  When you use a stepladder, make sure that it  is completely opened and that the sides are firmly locked. Have someone hold the ladder while you are using it. Do not climb a closed stepladder.  Add color or contrast paint or tape to grab bars and handrails in your home. Place contrasting color strips on the first and last steps.  Use mobility aids as needed, such as canes, walkers, scooters, and crutches.  Turn on lights if it is dark. Replace any light bulbs that burn out.  Set up furniture so that there are clear paths. Keep the furniture in the same spot.  Fix any uneven floor surfaces.  Choose a carpet design that does not hide the edge of steps of a stairway.  Be aware of any and all pets.  Review your medicines with your healthcare provider. Some medicines can cause  dizziness or changes in blood pressure, which increase your risk of falling. Talk with your health care provider about other ways that you can decrease your risk of falls. This may include working with a physical therapist or trainer to improve your strength, balance, and endurance.   This information is not intended to replace advice given to you by your health care provider. Make sure you discuss any questions you have with your health care provider.   Document Released: 11/28/2002 Document Revised: 04/24/2015 Document Reviewed: 01/12/2015 Elsevier Interactive Patient Education 2016 [employer]  Calcium & Vitamin D: The Facts  Why is calcium and vitamin D consumption important? Calcium: . Most Americans do not consume adequate amounts of calcium! Calcium is required for proper muscle function, nerve communication, bone support, and many other functions in the body.  . The body uses bones as a source of calcium. Bones 'remodel' themselves continuously - the body constantly breaks bone down to release calcium and rebuilds bones by replacing calcium in the bone later.  . As we get older, the rate of bone breakdown occurs faster than bone rebuilding which could lead to osteopenia, osteoporosis, and possible fractures.   Vitamin D: . People naturally make vitamin D in the body when sunlight hits the skin and triggers a process that leads to vitamin D production. This natural vitamin D production requires about 10-15 minutes of sun exposure on the hands, arms, and face at least 2-3 times per week. However, due to decreased sun exposure and the use of sunscreen, most people will need to get additional vitamin D from foods or supplements. Your doctor can measure your body's vitamin D level through a simple blood test to determine your daily vitamin D needs.  . Vitamin D is used to help the body absorb calcium, maintain bone health, help the immune system, and reduce inflammation. It also plays a role  in muscle performance, balance and risk of falling.  . Vitamin D deficiency can lead to osteomalacia or softening of the bones, bone pain, and muscle weakness.   The recommended daily allowance of Calcium and Vitamin D varies for different age groups. Age group Calcium (mg) Vitamin D (IU)  Females and Males: Age 37-50 1000 mg 600 IU  Females: Age 76- 8 1200 mg 600 IU  Males: Age 61-70 1000 mg 600 IU  Females and Males: Age 17+ 1200 mg 800 IU  Pregnant/lactating Females age 26-50 1000 mg 600 IU   How much Calcium do you get in your diet? Calcium Intake # of servings per day  Total calcium (mg)  Skim milk, 2% milk (1 cup) _________ x 300 mg   Yogurt (1 small container)  _________ x 200 mg   Cheese (1oz) _________ x 200 mg   Cottage Cheese (1 cup)             ________ x 150 mg   Almond milk (1 cup) _________ x 450 mg   Fortified Orange Juice (1 cup) _________ x 300 mg   Broccoli or spinach ( 1 cup) _________ x 100 mg   Salmon (3 oz) _________ x 150 mg    Almonds (1/4 cup) _______ x 90 mg      How do we get Calcium and Vitamin D in our diet? Calcium: . Obtaining calcium from the diet is the most preferred way to reach the recommended daily goal. If this goal is not reached through diet, calcium supplements are available.  . Calcium is found in many foods including: dairy products, dark leafy vegetables (like broccoli, kale, and spinach), fish, and fortified products like juices and cereals.  . The food label will have a %DV (percent daily value) listed showing the amount of calcium per serving. To determine the total mg per serving, simply replace the % with zero (0).  For example, Almond Breeze almond milk contains 45% DV of calcium or 450mg  per 1 cup.  . You can increase the amount of calcium in your diet by using more calcium products in your daily meals. Use yogurt and fruit to make smoothies or use yogurt to top baked potatoes or make whipped potatoes. Sprinkle low fat cheese onto  salads or into egg white omelets. You can even add non-fat dry milk powder (300mg  calcium per 1/3 cup) to hot cereals, meat loaf, soups, or potatoes.  . Calcium supplements come in many forms including tablets, chewables, and gummies. Be sure to read the label to determine the correct number of tablets per serving and whether or not to take the supplement with food.  . Calcium carbonate products (Oscal, Caltrate, and Viactiv) are generally better absorbed when taken with food while calcium citrate products like Citracal can be taken with or without food.  . The body can only absorb about 600 mg of calcium at one time. It is recommended to take calcium supplements in small amounts several times per day.  However, taking it all at once is better than not taking it at all. . Increasing your intake of calcium is essential for bone health, but may also lead to some side effects like constipation, increased gas, bloating or abdominal cramping. To help reduce these side effects, start with 1 tablet per day and slowly increase your intake of the supplement to the recommended doses. It is also recommended that you drink plenty of water each day. Vitamin D: . Very few foods naturally contain vitamin D. However, it is found in saltwater fish (like tuna, salmon and mackerel), beef liver, egg yolks, cheese and vitamin D fortified foods (like yogurt, cereals, orange juice and milk) . The amount of vitamin D in each food or product is listed as %DV on the product label. To determine the total amount of vitamin D per serving, drop the % sign and multiply the number by 4. For example, 1 cup of Almond Breeze almond milk contains 25% DV vitamin D or 100 IU per serving (25 x 4 =100). . Vitamin D is also found in multivitamins and supplements and may be listed as ergocalciferol (vitamin D2) or cholecalciferol (vitamin D3). Each of these forms of vitamin D are equivalent and the daily recommended intake will vary based on your  age and the vitamin D levels in your body. Follow your doctor's recommendation for vitamin D intake.

## 2016-02-11 NOTE — Progress Notes (Signed)
Patient ID: Melissa Hall, female   DOB: [birth year], 82 y.o.   MRN: [id number]  [facility] Current Height: Height:  (149.9 cm)      Max Lifetime Height:  5' 3.5" Current Weight: Weight: 135 lb 8 oz (61.462 kg)       Ethnicity:Caucasian   HPI: Patient with osteoporosis.  Has been prescribed alendronate for the last 2 years but when checked pharmacy records she had only has filled twice in that time.  Patient states side effects are not a reason for not taking  Back Pain?  No       Kyphosis?  Yes - slight Prior fracture?  Shoulder - 2 years ago Med(s) for Osteoporosis/Osteopenia:  None Med(s) previously tried for Osteoporosis/Osteopenia:  Actonel - sore throat;  Fosamax - esophagitis (years ago but tolerated ok the last time she took)                                                             PMH: Age at menopause:  Late 70's Hysterectomy?  yes Oophorectomy?  Yes HRT? Yes - Former.  Type/duration: estorgen - 20 years Steroid Use?  No Thyroid med?  No History of cancer?  No History of digestive disorders (ie Crohn's)?  Yes Current or previous eating disorders?  No Last Vitamin D Result:  58.4 (10/09/2015) Last GFR Result:  85 (10/09/2015)   FH/SH: Family history of osteoporosis?  No - mother not diagnosed but she thinks she probably had osteoporosis Parent with history of hip fracture?  No Family history of breast cancer?  Yes - mother Exercise?  No Smoking?  No Alcohol?  No    Calcium Assessment Calcium Intake  # of servings/day  Calcium mg  Milk (8 oz) 1   x  300  =   Yogurt (4 oz) 0 x  200 =   Cheese (1 oz) 1 x  200 =   Other Calcium sources     Ca supplement MVI daily + calcium   =    Estimated calcium intake per day     DEXA Results Date of Test T-Score for AP Spine L1-L4 T-Score for Total Left Hip T-Score for Total Right Hip  02/01/2016 -2.6 -2.9 -2.8  01/25/2014 -1.2 -2.8 -2.8  02/16/2006 -2.1 -2.4 -2.1   10/02/2003 -2.0 -2.3 --  05/28/1999 -1.9 -2.2 --     Assessment: Osteoporosis with worsening BMD despite current treatment with alendronate for last 2 years though it appears patient has not been compliant  Recommendations: 1.  Discussed multiple treatment options bisphosphonates, Forteo and Prolia.  Will check insurance overage for Prolia  q 6 months 2.  continue calcium  daily through supplementation or diet.  3.  recommend weight bearing exercise - 30 minutes at least 4 days per week.   4.  Counseled and educated about fall risk and prevention.  Recheck DEXA:  2 years  Time spent counseling patient:  30 minutes  Henrene Pastor, PharmD, CPP

## 2016-02-12 ENCOUNTER — Encounter: Payer: Self-pay | Admitting: Pharmacist

## 2016-02-14 ENCOUNTER — Other Ambulatory Visit: Payer: Self-pay | Admitting: *Deleted

## 2016-02-14 DIAGNOSIS — R195 Other fecal abnormalities: Secondary | ICD-10-CM

## 2016-02-14 LAB — FECAL OCCULT BLOOD, IMMUNOCHEMICAL: FECAL OCCULT BLD: POSITIVE — AB

## 2016-02-19 ENCOUNTER — Other Ambulatory Visit: Payer: Medicare Other

## 2016-02-19 DIAGNOSIS — K921 Melena: Secondary | ICD-10-CM

## 2016-02-19 DIAGNOSIS — R195 Other fecal abnormalities: Secondary | ICD-10-CM

## 2016-02-19 NOTE — Progress Notes (Signed)
Lab only 

## 2016-02-21 LAB — FECAL OCCULT BLOOD, IMMUNOCHEMICAL: Fecal Occult Bld: POSITIVE — AB

## 2016-02-22 ENCOUNTER — Other Ambulatory Visit: Payer: Self-pay | Admitting: Family Medicine

## 2016-02-22 ENCOUNTER — Other Ambulatory Visit: Payer: Medicare Other

## 2016-02-22 DIAGNOSIS — R195 Other fecal abnormalities: Secondary | ICD-10-CM

## 2016-02-22 NOTE — Addendum Note (Signed)
Addended by: Quay BurowPOTTER, Lotoya Casella K on: 02/22/2016 01:35 PM   Modules accepted: Orders

## 2016-02-23 LAB — CBC
HEMATOCRIT: 38.3 % (ref 34.0–46.6)
Hemoglobin: 12.6 g/dL (ref 11.1–15.9)
MCH: 30.8 pg (ref 26.6–33.0)
MCHC: 32.9 g/dL (ref 31.5–35.7)
MCV: 94 fL (ref 79–97)
Platelets: 217 10*3/uL (ref 150–379)
RBC: 4.09 x10E6/uL (ref 3.77–5.28)
RDW: 13.5 % (ref 12.3–15.4)
WBC: 5.2 10*3/uL (ref 3.4–10.8)

## 2016-02-26 ENCOUNTER — Telehealth: Payer: Self-pay | Admitting: Family Medicine

## 2016-02-26 NOTE — Telephone Encounter (Signed)
Noted that patient refuses to have colonoscopy

## 2016-03-04 ENCOUNTER — Telehealth: Payer: Self-pay | Admitting: Family Medicine

## 2016-03-04 NOTE — Telephone Encounter (Signed)
Patient aware that Dr. Christell ConstantMoore does agree with her starting the Prolia.

## 2016-03-06 ENCOUNTER — Ambulatory Visit (INDEPENDENT_AMBULATORY_CARE_PROVIDER_SITE_OTHER): Payer: Medicare Other | Admitting: Pharmacist

## 2016-03-06 DIAGNOSIS — M810 Age-related osteoporosis without current pathological fracture: Secondary | ICD-10-CM | POA: Diagnosis not present

## 2016-03-06 MED ORDER — DENOSUMAB 60 MG/ML ~~LOC~~ SOLN
60.0000 mg | Freq: Once | SUBCUTANEOUS | Status: AC
  Administered 2016-03-06: 60 mg via SUBCUTANEOUS

## 2016-03-06 NOTE — Progress Notes (Signed)
Patient ID: Melissa Hall, female   DOB: 01-11-1928, 82 y.o.   MRN: [id number]  [facility]osis [facility]  HPI: Patient with osteoporosis.  Has been prescribed alendronate for the last 2 years but was not compliant. She is here today to receive her first Prolia injection  Back Pain?  No       Kyphosis?  Yes - slight Prior fracture?  Shoulder - 2 years ago Med(s) for Osteoporosis/Osteopenia:  None Med(s) previously tried for Osteoporosis/Osteopenia:  Actonel - sore throat;  Fosamax - esophagitis (years ago but tolerated ok the last time she took)                                                             PMH: Age at menopause:  Late 4650's Hysterectomy?  yes Oophorectomy?  Yes HRT? Yes - Former.  Type/duration: estorgen - 20 years Steroid Use?  No Thyroid med?  No History of cancer?  No History of digestive disorders (ie Crohn's)?  Yes Current or previous eating disorders?  No Last Vitamin D Result:  58.4 (10/09/2015) Last GFR Result:  85 (10/09/2015)   FH/SH: Family history of osteoporosis?  No - mother not diagnosed but she thinks she probably had osteoporosis Parent with history of hip fracture?  No Family history of breast cancer?  Yes - mother Exercise?  No - has been walking a little in home lately Smoking?  No Alcohol?  No    Calcium Assessment Calcium Intake  # of servings/day  Calcium mg  Milk (8 oz) 1   x  300  = 300mg   Yogurt (4 oz) 0 x  200 =   Cheese (1 oz) 1 x  200 = 200mg   Other Calcium sources   250mg   Ca supplement MVI daily + calcium 315mg   = 715mg    Estimated calcium intake per day 1465mg     DEXA Results Date of Test T-Score for AP Spine L1-L4 T-Score for Total Left Hip T-Score for Total Right Hip  02/01/2016 -2.6 -2.9 -2.8  01/25/2014 -1.2 -2.8 -2.8  02/16/2006 -2.1 -2.4 -2.1  10/02/2003 -2.0 -2.3 --  05/28/1999 -1.9 -2.2 --     Assessment: Osteoporosis with worsening BMD despite current treatment with alendronate for last 2 years though it appears  patient has not been compliant  Recommendations: 1.  Prolia 60mg  administered SQ in right arm today in office 2.  continue calcium 1200mg  daily through supplementation or diet.  3.  recommend weight bearing exercise - as able - goal is 30 minutes at least 4 days per week.   4.  Counseled and educated about fall risk and prevention.  Recheck DEXA:  2 years  Time spent counseling patient:  30 minutes  Henrene Pastorammy Camilia [place], PharmD, CPP

## 2016-04-02 ENCOUNTER — Encounter (INDEPENDENT_AMBULATORY_CARE_PROVIDER_SITE_OTHER): Payer: Self-pay

## 2016-04-15 ENCOUNTER — Other Ambulatory Visit: Payer: Self-pay | Admitting: Family Medicine

## 2016-05-24 ENCOUNTER — Other Ambulatory Visit: Payer: Self-pay | Admitting: Family Medicine

## 2016-06-09 ENCOUNTER — Ambulatory Visit: Payer: Self-pay | Admitting: Family Medicine

## 2016-06-12 ENCOUNTER — Other Ambulatory Visit: Payer: Self-pay | Admitting: Family Medicine

## 2016-06-12 DIAGNOSIS — M79672 Pain in left foot: Secondary | ICD-10-CM | POA: Diagnosis not present

## 2016-06-12 DIAGNOSIS — M25512 Pain in left shoulder: Secondary | ICD-10-CM | POA: Diagnosis not present

## 2016-06-25 ENCOUNTER — Ambulatory Visit (INDEPENDENT_AMBULATORY_CARE_PROVIDER_SITE_OTHER): Payer: Medicare Other | Admitting: Family Medicine

## 2016-06-25 ENCOUNTER — Encounter: Payer: Self-pay | Admitting: Family Medicine

## 2016-06-25 VITALS — BP 109/64 | HR 76 | Temp 97.3°F | Ht 59.0 in | Wt 135.0 lb

## 2016-06-25 DIAGNOSIS — R5383 Other fatigue: Secondary | ICD-10-CM

## 2016-06-25 DIAGNOSIS — I10 Essential (primary) hypertension: Secondary | ICD-10-CM

## 2016-06-25 DIAGNOSIS — M4120 Other idiopathic scoliosis, site unspecified: Secondary | ICD-10-CM | POA: Diagnosis not present

## 2016-06-25 DIAGNOSIS — E559 Vitamin D deficiency, unspecified: Secondary | ICD-10-CM

## 2016-06-25 DIAGNOSIS — E785 Hyperlipidemia, unspecified: Secondary | ICD-10-CM | POA: Diagnosis not present

## 2016-06-25 DIAGNOSIS — M810 Age-related osteoporosis without current pathological fracture: Secondary | ICD-10-CM

## 2016-06-25 NOTE — Patient Instructions (Signed)
Continue to exercise all extremities even when in a sitting position Continue to use the rolling walker regularly Always make every effort at not following If you're walking outside make some provision for having someone with you to observe you

## 2016-06-25 NOTE — Progress Notes (Signed)
Subjective:    Patient ID: Melissa Hall, female    DOB: 1928/03/27, 82 y.o.   MRN: [id number]  HPI Pt is here today for a follow up of chronic medical problems which include hypertension, hyperlipidemia, osteoporosis and Vit D deficiency.       Patient Active Problem List   Diagnosis Date Noted  . Fracture of proximal end of left humerus 05/13/2014  . Traumatic closed displaced fracture of proximal end of left humerus 05/12/2014    Class: Acute  . Proximal humerus fracture 05/12/2014  . Osteoporosis, post-menopausal 01/25/2014  . Hyperlipidemia 12/14/2013  . Hypertension 12/14/2013  . Vitamin D deficiency 12/14/2013  . Scoliosis (and kyphoscoliosis), idiopathic 12/14/2013   Outpatient Encounter Prescriptions as of 06/25/2016  Medication Sig  . aspirin EC 81 MG tablet Take 81 mg by mouth daily.  . calcium citrate-vitamin D (CITRACAL+D) 315-200 MG-UNIT per tablet Take 1 tablet by mouth daily.   . cholecalciferol (VITAMIN D) 1000 UNITS tablet Take 3,000 Units by mouth daily.   [facility] denosumab (PROLIA) 60 MG/ML SOLN injection Inject 60 mg into the skin every 6 (six) months. Administer in upper arm, thigh, or abdomen  . hydrochlorothiazide (MICROZIDE) 12.5 MG capsule TAKE (1) CAPSULE DAILY  . Multiple Vitamins-Minerals (MULTIVITAMIN WITH MINERALS) tablet Take 1 tablet by mouth daily.  . pravastatin (PRAVACHOL) 10 MG tablet TAKE 1 TABLET DAILY   No facility-administered encounter medications on file as of 06/25/2016.      Review of Systems  Constitutional: Positive for fatigue (slight).  HENT: Negative.   Respiratory: Negative.   Cardiovascular: Positive for leg swelling (L>R, intermitent).  Gastrointestinal: Negative.   Endocrine: Negative.   Genitourinary: Negative.   Musculoskeletal: Negative.   Allergic/Immunologic: Negative.   Neurological: Negative.   Hematological: Negative.   Psychiatric/Behavioral: Negative.        Objective:   Physical Exam  Constitutional:  She is oriented to person, place, and time. She appears well-developed and well-nourished. No distress.  Elderly alert and somewhat frail physically using a rolling walker  HENT:  Head: Normocephalic and atraumatic.  Right Ear: External ear normal.  Left Ear: External ear normal.  Nose: Nose normal.  Mouth/Throat: Oropharynx is clear and moist.  Eyes: Conjunctivae and EOM are normal. Pupils are equal, round, and reactive to light. Right eye exhibits no discharge. Left eye exhibits no discharge. No scleral icterus.  Neck: Normal range of motion. Neck supple. No thyromegaly present.  Cardiovascular: Normal rate, regular rhythm, normal heart sounds and intact distal pulses.   No murmur heard. Heart has a rhythm that is regular at 72/m  Pulmonary/Chest: Effort normal and breath sounds normal. No respiratory distress. She has no wheezes. She has no rales. She exhibits no tenderness.  Clear anteriorly and posteriorly  Abdominal: Soft. Bowel sounds are normal. She exhibits no mass. There is no tenderness. There is no rebound and no guarding.  Nontender without masses or organ enlargement or suprapubic tenderness  Musculoskeletal: Normal range of motion. She exhibits no edema or tenderness.  Patient has lower extremity weakness but she does better with her rolling walker.  Lymphadenopathy:    She has no cervical adenopathy.  Neurological: She is alert and oriented to person, place, and time. She has normal reflexes. No cranial nerve deficit.  Skin: Skin is warm and dry. No rash noted.  Psychiatric: She has a normal mood and affect. Her behavior is normal. Judgment and thought content normal.  Nursing note and vitals reviewed.  BP 109/64 mmHg  Pulse 76  Temp(Src) 97.3 F (36.3 C) (Oral)  Ht _0  (1.499 m)  Wt 135 lb (61.236 kg)  BMI 27.25 kg/m2  SpO2 98%  EKG: Within normal limits        Assessment & Plan:  1. Hyperlipidemia -Continue current treatment pending results of lab  work - Hepatic function panel  2. Essential hypertension -The blood pressure is good today and she will continue with current treatment - CBC with Differential/Platelet - BMP8+EGFR - EKG 12-Lead  3. Vitamin D deficiency -Continue vitamin D replacement pending results of lab work - VITAMIN D 25 Hydroxy (Vit-D Deficiency, Fractures)  4. Osteoporosis, post-menopausal -Make every effort of moving slowly and using walker regularly so as not to fall and having future fracture -Continue range of motion exercises of all extremities even when sitting - VITAMIN D 25 Hydroxy (Vit-D Deficiency, Fractures)  5. Other fatigue - CBC with Differential/Platelet - Thyroid Panel With TSH  6. Scoliosis (and kyphoscoliosis), idiopathic -Exercise regularly and walk using walker on a regular basis                       Medicare Annual Wellness Visit  [facility] and the medical providers at [facility] strive to bring you the best medical care.  In doing so we not only want to address your current medical conditions and concerns but also to detect new conditions early and prevent illness, disease and health-related problems.    Medicare offers a yearly Wellness Visit which allows our clinical staff to assess your need for preventative services including immunizations, lifestyle education, counseling to decrease risk of preventable diseases and screening for fall risk and other medical concerns.    This visit is provided free of charge (no copay) for all Medicare recipients. The clinical pharmacists at [facility] have begun to conduct these Wellness Visits which will also include a thorough review of all your medications.    As you primary medical provider recommend that you make an appointment for your Annual Wellness Visit if you have not done so already this year.  You may set up this appointment before you leave today or you may call back ([phone]) and  schedule an appointment.  Please make sure when you call that you mention that you are scheduling your Annual Wellness Visit with the clinical pharmacist so that the appointment may be made for the proper length of time.  Continue current medications. Continue good therapeutic lifestyle changes which include good diet and exercise. Fall precautions discussed with patient. If an FOBT was given today- please return it to our front desk. If you are over 25 years old - you may need Prevnar 77 or the adult Pneumonia vaccine.  **Flu shots are available--- please call and schedule a FLU-CLINIC appointment**  After your visit with [place] today you will receive a survey in the mail or online from [employer] regarding your care with [place]. Please take a moment to fill this out. Your feedback is very important to [place] as you can help [place] better understand your patient needs as well as improve your experience and satisfaction. WE CARE ABOUT YOU!!!   Patient Instructions  Continue to exercise all extremities even when in a sitting position Continue to use the rolling walker regularly Always make every effort at not following If you're walking outside make some provision for having someone with you to observe you   Arrie Senate MD

## 2016-06-26 LAB — CBC WITH DIFFERENTIAL/PLATELET
BASOS: 0 %
Basophils Absolute: 0 10*3/uL (ref 0.0–0.2)
EOS (ABSOLUTE): 0 10*3/uL (ref 0.0–0.4)
EOS: 1 %
HEMATOCRIT: 36.7 % (ref 34.0–46.6)
HEMOGLOBIN: 12.3 g/dL (ref 11.1–15.9)
Immature Grans (Abs): 0 10*3/uL (ref 0.0–0.1)
Immature Granulocytes: 0 %
LYMPHS ABS: 1.4 10*3/uL (ref 0.7–3.1)
Lymphs: 24 %
MCH: 31.2 pg (ref 26.6–33.0)
MCHC: 33.5 g/dL (ref 31.5–35.7)
MCV: 93 fL (ref 79–97)
Monocytes Absolute: 0.5 10*3/uL (ref 0.1–0.9)
Monocytes: 8 %
Neutrophils Absolute: 4.1 10*3/uL (ref 1.4–7.0)
Neutrophils: 67 %
Platelets: 202 10*3/uL (ref 150–379)
RBC: 3.94 x10E6/uL (ref 3.77–5.28)
RDW: 13.3 % (ref 12.3–15.4)
WBC: 6.1 10*3/uL (ref 3.4–10.8)

## 2016-06-26 LAB — HEPATIC FUNCTION PANEL
ALBUMIN: 4 g/dL (ref 3.5–4.7)
ALT: 15 IU/L (ref 0–32)
AST: 27 IU/L (ref 0–40)
Alkaline Phosphatase: 55 IU/L (ref 39–117)
BILIRUBIN TOTAL: 0.3 mg/dL (ref 0.0–1.2)
Bilirubin, Direct: 0.14 mg/dL (ref 0.00–0.40)
TOTAL PROTEIN: 6.3 g/dL (ref 6.0–8.5)

## 2016-06-26 LAB — THYROID PANEL WITH TSH
FREE THYROXINE INDEX: 1.7 (ref 1.2–4.9)
T3 Uptake Ratio: 25 % (ref 24–39)
T4, Total: 6.8 ug/dL (ref 4.5–12.0)
TSH: 0.956 u[IU]/mL (ref 0.450–4.500)

## 2016-06-26 LAB — LIPID PANEL
Chol/HDL Ratio: 2.6 ratio units (ref 0.0–4.4)
Cholesterol, Total: 172 mg/dL (ref 100–199)
HDL: 65 mg/dL (ref >39)
LDL CALC: 91 mg/dL (ref 0–99)
Triglycerides: 82 mg/dL (ref 0–149)
VLDL CHOLESTEROL CAL: 16 mg/dL (ref 5–40)

## 2016-06-26 LAB — BMP8+EGFR
BUN/Creatinine Ratio: 18 (ref 12–28)
BUN: 11 mg/dL (ref 8–27)
CO2: 27 mmol/L (ref 18–29)
CREATININE: 0.62 mg/dL (ref 0.57–1.00)
Calcium: 9.5 mg/dL (ref 8.7–10.3)
Chloride: 104 mmol/L (ref 96–106)
GFR, EST AFRICAN AMERICAN: 95 mL/min/{1.73_m2} (ref >59)
GFR, EST NON AFRICAN AMERICAN: 82 mL/min/{1.73_m2} (ref >59)
Glucose: 91 mg/dL (ref 65–99)
Potassium: 3.7 mmol/L (ref 3.5–5.2)
Sodium: 146 mmol/L — ABNORMAL HIGH (ref 134–144)

## 2016-06-26 LAB — VITAMIN D 25 HYDROXY (VIT D DEFICIENCY, FRACTURES): Vit D, 25-Hydroxy: 55.2 ng/mL (ref 30.0–100.0)

## 2016-06-26 LAB — SPECIMEN STATUS REPORT

## 2016-08-12 ENCOUNTER — Other Ambulatory Visit: Payer: Self-pay | Admitting: Family Medicine

## 2016-08-20 ENCOUNTER — Other Ambulatory Visit: Payer: Self-pay | Admitting: Family Medicine

## 2016-09-08 ENCOUNTER — Other Ambulatory Visit: Payer: Self-pay | Admitting: Family Medicine

## 2016-09-08 ENCOUNTER — Ambulatory Visit: Payer: Self-pay | Admitting: Pharmacist

## 2016-09-14 ENCOUNTER — Encounter: Payer: Self-pay | Admitting: Pharmacist

## 2016-10-28 ENCOUNTER — Ambulatory Visit: Payer: Medicare Other | Admitting: Family Medicine

## 2016-11-17 ENCOUNTER — Other Ambulatory Visit: Payer: Self-pay | Admitting: Family Medicine

## 2016-11-25 ENCOUNTER — Encounter: Payer: Self-pay | Admitting: Family Medicine

## 2016-11-25 ENCOUNTER — Ambulatory Visit (INDEPENDENT_AMBULATORY_CARE_PROVIDER_SITE_OTHER): Payer: Medicare Other

## 2016-11-25 ENCOUNTER — Ambulatory Visit (INDEPENDENT_AMBULATORY_CARE_PROVIDER_SITE_OTHER): Payer: Medicare Other | Admitting: Family Medicine

## 2016-11-25 VITALS — BP 127/73 | HR 68 | Temp 97.3°F | Ht 59.0 in | Wt 134.0 lb

## 2016-11-25 DIAGNOSIS — M810 Age-related osteoporosis without current pathological fracture: Secondary | ICD-10-CM | POA: Diagnosis not present

## 2016-11-25 DIAGNOSIS — I10 Essential (primary) hypertension: Secondary | ICD-10-CM

## 2016-11-25 DIAGNOSIS — M4120 Other idiopathic scoliosis, site unspecified: Secondary | ICD-10-CM

## 2016-11-25 DIAGNOSIS — E559 Vitamin D deficiency, unspecified: Secondary | ICD-10-CM

## 2016-11-25 DIAGNOSIS — E7800 Pure hypercholesterolemia, unspecified: Secondary | ICD-10-CM

## 2016-11-25 NOTE — Progress Notes (Signed)
Subjective:    Patient ID: Melissa Hall, female    DOB: 02-11-1928, 82 y.o.   MRN: [id number]  HPI Pt here for follow up and management of chronic medical problems which includes hyperlipidemia and hypertension. She is taking medications regularly.The patient is doing well overall and has no specific complaints and is requiring no refills. She will get lab work and a chest x-ray today. She has had joint replacement in the past has a history of fracture. She has scoliosis and osteoporosis. The patient is pleasant and alert and using her rolling walker that has a seat today and she is very proud of this. She keeps it in front of her all times. Her husband is very supportive. She tries to exercise and walk daily on their flat driveway when the weather is permitting. She denies any chest pain pressure tightness or palpitations. She denies any trouble with her breathing including shortness of breath or cough. She has no trouble with swallowing heartburn indigestion nausea vomiting diarrhea or blood in the stool. She is passing her water without problems and tries to drink plenty of fluids.   Patient Active Problem List   Diagnosis Date Noted  . Fracture of proximal end of left humerus 05/13/2014  . Traumatic closed displaced fracture of proximal end of left humerus 05/12/2014    Class: Acute  . Proximal humerus fracture 05/12/2014  . Osteoporosis, post-menopausal 01/25/2014  . Hyperlipidemia 12/14/2013  . Hypertension 12/14/2013  . Vitamin D deficiency 12/14/2013  . Scoliosis (and kyphoscoliosis), idiopathic 12/14/2013   Outpatient Encounter Prescriptions as of 11/25/2016  Medication Sig  . aspirin EC 81 MG tablet Take 81 mg by mouth daily.  . calcium citrate-vitamin D (CITRACAL+D) 315-200 MG-UNIT per tablet Take 1 tablet by mouth daily.   . cholecalciferol (VITAMIN D) 1000 UNITS tablet Take 3,000 Units by mouth daily.   . hydrochlorothiazide (MICROZIDE) 12.5 MG capsule TAKE (1) CAPSULE DAILY   . Multiple Vitamins-Minerals (MULTIVITAMIN WITH MINERALS) tablet Take 1 tablet by mouth daily.  . pravastatin (PRAVACHOL) 10 MG tablet TAKE 1 TABLET DAILY   No facility-administered encounter medications on file as of 11/25/2016.       Review of Systems  Constitutional: Negative.   HENT: Negative.   Eyes: Negative.   Respiratory: Negative.   Cardiovascular: Negative.   Gastrointestinal: Negative.   Endocrine: Negative.   Genitourinary: Negative.   Musculoskeletal: Negative.   Skin: Negative.   Allergic/Immunologic: Negative.   Neurological: Negative.   Hematological: Negative.   Psychiatric/Behavioral: Negative.        Objective:   Physical Exam  Constitutional: She is oriented to person, place, and time. No distress.  Frail and elderly but alert and calm  HENT:  Head: Normocephalic and atraumatic.  Right Ear: External ear normal.  Left Ear: External ear normal.  Nose: Nose normal.  Mouth/Throat: Oropharynx is clear and moist. No oropharyngeal exudate.  Eyes: Conjunctivae and EOM are normal. Pupils are equal, round, and reactive to light. Right eye exhibits no discharge. Left eye exhibits no discharge. No scleral icterus.  Neck: Normal range of motion. Neck supple. No thyromegaly present.  No thyromegaly or bruits  Cardiovascular: Normal rate and intact distal pulses.   No murmur heard. The heart is slightly irregular at 72/m  Pulmonary/Chest: Effort normal and breath sounds normal. No respiratory distress. She has no wheezes. She has no rales.  Clear anteriorly and posteriorly  Abdominal: Soft. Bowel sounds are normal. She exhibits no mass. There is no tenderness.  There is no rebound and no guarding.  No abdominal tenderness or organ enlargement bruits or masses  Musculoskeletal: She exhibits deformity. She exhibits no edema.  Since range of motion is limited because of her severe kyphoscoliosis. She is able to use a walker and get up and down without very much problem   Lymphadenopathy:    She has no cervical adenopathy.  Neurological: She is alert and oriented to person, place, and time. She has normal reflexes. No cranial nerve deficit.  Skin: Skin is warm and dry. No rash noted.  Psychiatric: She has a normal mood and affect. Her behavior is normal. Judgment and thought content normal.  Mood and affect are positive. She and her husband have been married for over 71 years.  Nursing note and vitals reviewed.  BP 127/73 (BP Location: Left Arm)   Pulse 68   Temp 97.3 F (36.3 C) (Oral)   Ht 4' 11"  (1.499 m)   Wt 134 lb (60.8 kg)   BMI 27.06 kg/m         Assessment & Plan:  1. Pure hypercholesterolemia -Continue pravastatin and as aggressive therapeutic lifestyle changes as possible pending results of lab work - Lipid panel - CBC with Differential/Platelet - DG Chest 2 View; Future  2. Essential hypertension -The blood pressure is excellent today and she will continue with her current treatment - BMP8+EGFR - CBC with Differential/Platelet - Hepatic function panel - DG Chest 2 View; Future  3. Vitamin D deficiency -Continue with current treatment pending results of lab work - CBC with Differential/Platelet - VITAMIN D 25 Hydroxy (Vit-D Deficiency, Fractures)  4. Scoliosis (and kyphoscoliosis), idiopathic -Use walker constantly because of the imbalance problems you have because of the severe kyphoscoliosis.  5. Osteoporosis, post-menopausal -Exercise by walking as much as possible make sure that your supervisor you don't fall and injure herself.  Patient Instructions                       Medicare Annual Wellness Visit  [facility] and the medical providers at [facility] strive to bring you the best medical care.  In doing so we not only want to address your current medical conditions and concerns but also to detect new conditions early and prevent illness, disease and health-related problems.    Medicare  offers a yearly Wellness Visit which allows our clinical staff to assess your need for preventative services including immunizations, lifestyle education, counseling to decrease risk of preventable diseases and screening for fall risk and other medical concerns.    This visit is provided free of charge (no copay) for all Medicare recipients. The clinical pharmacists at [facility] have begun to conduct these Wellness Visits which will also include a thorough review of all your medications.    As you primary medical provider recommend that you make an appointment for your Annual Wellness Visit if you have not done so already this year.  You may set up this appointment before you leave today or you may call back ([phone]) and schedule an appointment.  Please make sure when you call that you mention that you are scheduling your Annual Wellness Visit with the clinical pharmacist so that the appointment may be made for the proper length of time.  ,   Continue current medications. Continue good therapeutic lifestyle changes which include good diet and exercise. Fall precautions discussed with patient. If an FOBT was given today- please return it to  our front desk. If you are over 66 years old - you may need Prevnar 59 or the adult Pneumonia vaccine.  **Flu shots are available--- please call and schedule a FLU-CLINIC appointment**  After your visit with [place] today you will receive a survey in the mail or online from [employer] regarding your care with [place]. Please take a moment to fill this out. Your feedback is very important to [place] as you can help [place] better understand your patient needs as well as improve your experience and satisfaction. WE CARE ABOUT YOU!!!  Drink plenty of fluids and stay well hydrated Use the walker constantly and always be careful not put yourself at risk for falling Exercise when possible with a companion.   Arrie Senate MD

## 2016-11-25 NOTE — Patient Instructions (Addendum)
Medicare Annual Wellness Visit  [facility] and the medical providers at [facility] strive to bring you the best medical care.  In doing so we not only want to address your current medical conditions and concerns but also to detect new conditions early and prevent illness, disease and health-related problems.    Medicare offers a yearly Wellness Visit which allows our clinical staff to assess your need for preventative services including immunizations, lifestyle education, counseling to decrease risk of preventable diseases and screening for fall risk and other medical concerns.    This visit is provided free of charge (no copay) for all Medicare recipients. The clinical pharmacists at [facility] have begun to conduct these Wellness Visits which will also include a thorough review of all your medications.    As you primary medical provider recommend that you make an appointment for your Annual Wellness Visit if you have not done so already this year.  You may set up this appointment before you leave today or you may call back [phone] and schedule an appointment.  Please make sure when you call that you mention that you are scheduling your Annual Wellness Visit with the clinical pharmacist so that the appointment may be made for the proper length of time.  ,   Continue current medications. Continue good therapeutic lifestyle changes which include good diet and exercise. Fall precautions discussed with patient. If an FOBT was given today- please return it to our front desk. If you are over 281 years old - you may need Prevnar 13 or the adult Pneumonia vaccine.  **Flu shots are available--- please call and schedule a FLU-CLINIC appointment**  After your visit with us today you will receive a survey in the mail or online from [employer] regarding your care with us. Please take a moment to fill this out. Your feedback is very  important to us as you can help us better understand your patient needs as well as improve your experience and satisfaction. WE CARE ABOUT YOU!!!  Drink plenty of fluids and stay well hydrated Use the walker constantly and always be careful not put yourself at risk for falling Exercise when possible with a companion.

## 2016-11-26 ENCOUNTER — Telehealth: Payer: Self-pay | Admitting: Family Medicine

## 2016-11-26 LAB — HEPATIC FUNCTION PANEL
ALBUMIN: 4.1 g/dL (ref 3.5–4.7)
ALK PHOS: 55 IU/L (ref 39–117)
ALT: 18 IU/L (ref 0–32)
AST: 29 IU/L (ref 0–40)
Bilirubin Total: 0.6 mg/dL (ref 0.0–1.2)
Bilirubin, Direct: 0.2 mg/dL (ref 0.00–0.40)
Total Protein: 6.2 g/dL (ref 6.0–8.5)

## 2016-11-26 LAB — VITAMIN D 25 HYDROXY (VIT D DEFICIENCY, FRACTURES): Vit D, 25-Hydroxy: 50.8 ng/mL (ref 30.0–100.0)

## 2016-11-26 LAB — CBC WITH DIFFERENTIAL/PLATELET
Basophils Absolute: 0 10*3/uL (ref 0.0–0.2)
Basos: 0 %
EOS (ABSOLUTE): 0 10*3/uL (ref 0.0–0.4)
EOS: 0 %
HEMATOCRIT: 37.3 % (ref 34.0–46.6)
HEMOGLOBIN: 12.3 g/dL (ref 11.1–15.9)
Immature Grans (Abs): 0 10*3/uL (ref 0.0–0.1)
Immature Granulocytes: 0 %
LYMPHS ABS: 1.4 10*3/uL (ref 0.7–3.1)
Lymphs: 27 %
MCH: 30.6 pg (ref 26.6–33.0)
MCHC: 33 g/dL (ref 31.5–35.7)
MCV: 93 fL (ref 79–97)
MONOCYTES: 10 %
Monocytes Absolute: 0.5 10*3/uL (ref 0.1–0.9)
Neutrophils Absolute: 3.2 10*3/uL (ref 1.4–7.0)
Neutrophils: 63 %
Platelets: 199 10*3/uL (ref 150–379)
RBC: 4.02 x10E6/uL (ref 3.77–5.28)
RDW: 13.3 % (ref 12.3–15.4)
WBC: 5.2 10*3/uL (ref 3.4–10.8)

## 2016-11-26 LAB — LIPID PANEL
CHOLESTEROL TOTAL: 184 mg/dL (ref 100–199)
Chol/HDL Ratio: 2.6 ratio units (ref 0.0–4.4)
HDL: 71 mg/dL (ref >39)
LDL Calculated: 99 mg/dL (ref 0–99)
TRIGLYCERIDES: 70 mg/dL (ref 0–149)
VLDL Cholesterol Cal: 14 mg/dL (ref 5–40)

## 2016-11-26 LAB — BMP8+EGFR
BUN/Creatinine Ratio: 17 (ref 12–28)
BUN: 10 mg/dL (ref 8–27)
CALCIUM: 9.5 mg/dL (ref 8.7–10.3)
CHLORIDE: 102 mmol/L (ref 96–106)
CO2: 28 mmol/L (ref 18–29)
Creatinine, Ser: 0.59 mg/dL (ref 0.57–1.00)
GFR calc Af Amer: 96 mL/min/{1.73_m2} (ref >59)
GFR calc non Af Amer: 83 mL/min/{1.73_m2} (ref >59)
GLUCOSE: 84 mg/dL (ref 65–99)
POTASSIUM: 3.9 mmol/L (ref 3.5–5.2)
Sodium: 145 mmol/L — ABNORMAL HIGH (ref 134–144)

## 2016-11-26 NOTE — Telephone Encounter (Signed)
Patient was informed of results 

## 2016-12-08 ENCOUNTER — Other Ambulatory Visit: Payer: Self-pay | Admitting: Family Medicine

## 2017-01-09 DIAGNOSIS — H40033 Anatomical narrow angle, bilateral: Secondary | ICD-10-CM | POA: Diagnosis not present

## 2017-01-09 DIAGNOSIS — H04123 Dry eye syndrome of bilateral lacrimal glands: Secondary | ICD-10-CM | POA: Diagnosis not present

## 2017-02-13 ENCOUNTER — Other Ambulatory Visit: Payer: Self-pay | Admitting: Family Medicine

## 2017-03-25 ENCOUNTER — Encounter: Payer: Self-pay | Admitting: *Deleted

## 2017-04-01 DIAGNOSIS — G44219 Episodic tension-type headache, not intractable: Secondary | ICD-10-CM | POA: Diagnosis not present

## 2017-04-21 ENCOUNTER — Ambulatory Visit: Payer: Medicare Other | Admitting: Family Medicine

## 2017-04-24 ENCOUNTER — Ambulatory Visit: Payer: Medicare Other | Admitting: Family Medicine

## 2017-04-30 ENCOUNTER — Ambulatory Visit (INDEPENDENT_AMBULATORY_CARE_PROVIDER_SITE_OTHER): Payer: Medicare Other | Admitting: Family Medicine

## 2017-04-30 ENCOUNTER — Encounter: Payer: Self-pay | Admitting: Family Medicine

## 2017-04-30 VITALS — BP 126/73 | HR 75 | Temp 97.2°F | Ht 59.0 in | Wt 134.0 lb

## 2017-04-30 DIAGNOSIS — I10 Essential (primary) hypertension: Secondary | ICD-10-CM | POA: Diagnosis not present

## 2017-04-30 DIAGNOSIS — E7800 Pure hypercholesterolemia, unspecified: Secondary | ICD-10-CM | POA: Diagnosis not present

## 2017-04-30 DIAGNOSIS — M4120 Other idiopathic scoliosis, site unspecified: Secondary | ICD-10-CM | POA: Diagnosis not present

## 2017-04-30 DIAGNOSIS — E559 Vitamin D deficiency, unspecified: Secondary | ICD-10-CM | POA: Diagnosis not present

## 2017-04-30 DIAGNOSIS — R159 Full incontinence of feces: Secondary | ICD-10-CM

## 2017-04-30 DIAGNOSIS — R2681 Unsteadiness on feet: Secondary | ICD-10-CM

## 2017-04-30 NOTE — Progress Notes (Signed)
Subjective:    Patient ID: Melissa Hall, female    DOB: [birth year], 83 y.o.   MRN: [id number]  HPI Pt here for follow up and management of chronic medical problems which includes hypertension and hyperlipidemia. She is taking medication regularly.The patient has had some stool incontinence. She also continues to have some left shoulder pain following a fall over 6 months ago. At that time she had a fracture more of the proximal end of the left humerus. She will be given an FOBT to return and will get lab work today. The patient denies any chest pain or shortness of breath. She denies any trouble with her stomach currently although has had some problems with bowel control and agrees that she needs to start back on a fiber supplement and she does have Citrucel at home and she will restart this and maybe take it 2 or 3 times a week in addition to drinking plenty of water. She denies any blood in the stool or black tarry bowel movements. She denies any heartburn or indigestion. She says she is passing her water without problems.    Patient Active Problem List   Diagnosis Date Noted  . Fracture of proximal end of left humerus 05/13/2014  . Traumatic closed displaced fracture of proximal end of left humerus 05/12/2014    Class: Acute  . Proximal humerus fracture 05/12/2014  . Osteoporosis, post-menopausal 01/25/2014  . Hyperlipidemia 12/14/2013  . Hypertension 12/14/2013  . Vitamin D deficiency 12/14/2013  . Scoliosis (and kyphoscoliosis), idiopathic 12/14/2013   Outpatient Encounter Prescriptions as of 04/30/2017  Medication Sig  . aspirin EC 81 MG tablet Take 81 mg by mouth daily.  . calcium citrate-vitamin D (CITRACAL+D) 315-200 MG-UNIT per tablet Take 1 tablet by mouth daily.   . cholecalciferol (VITAMIN D) 1000 UNITS tablet Take 3,000 Units by mouth daily.   . hydrochlorothiazide (MICROZIDE) 12.5 MG capsule TAKE (1) CAPSULE DAILY  . Multiple Vitamins-Minerals (MULTIVITAMIN WITH  MINERALS) tablet Take 1 tablet by mouth daily.  . pravastatin (PRAVACHOL) 10 MG tablet TAKE 1 TABLET DAILY   No facility-administered encounter medications on file as of 04/30/2017.       Review of Systems  Constitutional: Negative.   HENT: Negative.   Eyes: Negative.   Respiratory: Negative.   Cardiovascular: Negative.   Gastrointestinal: Negative.        Bowel incont.  Endocrine: Negative.   Genitourinary: Negative.   Musculoskeletal: Positive for arthralgias (left shoulder pain from fall about 6 mos ago).  Skin: Negative.   Allergic/Immunologic: Negative.   Neurological: Negative.   Hematological: Negative.   Psychiatric/Behavioral: Negative.        Objective:   Physical Exam  Constitutional: She is oriented to person, place, and time. She appears well-developed and well-nourished. No distress.  The patient is pleasant and alert  HENT:  Head: Normocephalic and atraumatic.  Right Ear: External ear normal.  Left Ear: External ear normal.  Nose: Nose normal.  Mouth/Throat: Oropharynx is clear and moist. No oropharyngeal exudate.  Eyes: Conjunctivae and EOM are normal. Pupils are equal, round, and reactive to light. Right eye exhibits no discharge. Left eye exhibits no discharge. No scleral icterus.  Neck: Normal range of motion. Neck supple. No thyromegaly present.  No bruits thyromegaly or anterior cervical adenopathy  Cardiovascular: Normal rate, regular rhythm, normal heart sounds and intact distal pulses.   No murmur heard. Slightly decreased pedal pulses on the left compared to the right but good inguinal pulses bilaterally The  heart has a regular rate and rhythm at 72/m  Pulmonary/Chest: Effort normal and breath sounds normal. No respiratory distress. She has no wheezes. She has no rales.  Clear anteriorly and posteriorly  Abdominal: Soft. Bowel sounds are normal. She exhibits no mass. There is no tenderness. There is no rebound and no guarding.  No abdominal  tenderness masses or organ enlargement or bruits  Musculoskeletal: She exhibits no edema or tenderness.  The patient has kyphoscoliosis and uses her walker for adequate gait control and stability  Lymphadenopathy:    She has no cervical adenopathy.  Neurological: She is alert and oriented to person, place, and time. She has normal reflexes. No cranial nerve deficit.  Skin: Skin is warm and dry. No rash noted.  Psychiatric: She has a normal mood and affect. Her behavior is normal. Judgment and thought content normal.  Nursing note and vitals reviewed.  BP 126/73 (BP Location: Left Arm)   Pulse 75   Temp 97.2 F (36.2 C) (Oral)   Ht 4\' 11"  (1.499 m)   Wt 134 lb (60.8 kg)   BMI 27.06 kg/m         Assessment & Plan:  1. Pure hypercholesterolemia -Continue current treatment pending results of lab work  2. Essential hypertension -The blood pressure is good today and she will continue with her current treatment  3. Vitamin D deficiency -Continue with current treatment  4. Scoliosis (and kyphoscoliosis), idiopathic -Continue to use walker because of gait instability and to prevent falling  5. Incontinence of feces, unspecified fecal incontinence type -The patient will use Citrucel 2-3 times weekly and try to drink more fluids  6. Gait instability -Continue to use walker and make all efforts to keep from falling and move slowly  No orders of the defined types were placed in this encounter.  Patient Instructions                       Medicare Annual Wellness Visit  [place] and the medical providers at [facility] strive to bring you the best medical care.  In doing so we not only want to address your current medical conditions and concerns but also to detect new conditions early and prevent illness, disease and health-related problems.    Medicare offers a yearly Wellness Visit which allows our clinical staff to assess your need for preventative  services including immunizations, lifestyle education, counseling to decrease risk of preventable diseases and screening for fall risk and other medical concerns.    This visit is provided free of charge (no copay) for all Medicare recipients. The clinical pharmacists at [facility] have begun to conduct these Wellness Visits which will also include a thorough review of all your medications.    As you primary medical provider recommend that you make an appointment for your Annual Wellness Visit if you have not done so already this year.  You may set up this appointment before you leave today or you may call back [phone]) and schedule an appointment.  Please make sure when you call that you mention that you are scheduling your Annual Wellness Visit with the clinical pharmacist so that the appointment may be made for the proper length of time.     Continue current medications. Continue good therapeutic lifestyle changes which include good diet and exercise. Fall precautions discussed with patient. If an FOBT was given today- please return it to our front desk. If you are over  25 years old - you may need Prevnar 13 or the adult Pneumonia vaccine.  **Flu shots are available--- please call and schedule a FLU-CLINIC appointment**  After your visit with [place] today you will receive a survey in the mail or online from [employer] regarding your care with [place]. Please take a moment to fill this out. Your feedback is very important to [place] as you can help [place] better understand your patient needs as well as improve your experience and satisfaction. WE CARE ABOUT YOU!!!   The patient will add Citrucel to her diet regimen 2-3 times weekly and will continue to use her walker on a regular basis to keep from falling She will continue to drink plenty of fluids and stay well hydrated  Nyra Capes MD

## 2017-04-30 NOTE — Patient Instructions (Addendum)
Medicare Annual Wellness Visit   and the medical providers at [facility] strive to bring you the best medical care.  In doing so we not only want to address your current medical conditions and concerns but also to detect new conditions early and prevent illness, disease and health-related problems.    Medicare offers a yearly Wellness Visit which allows our clinical staff to assess your need for preventative services including immunizations, lifestyle education, counseling to decrease risk of preventable diseases and screening for fall risk and other medical concerns.    This visit is provided free of charge (no copay) for all Medicare recipients. The clinical pharmacists at [facility] have begun to conduct these Wellness Visits which will also include a thorough review of all your medications.    As you primary medical provider recommend that you make an appointment for your Annual Wellness Visit if you have not done so already this year.  You may set up this appointment before you leave today or you may call back [phone] and schedule an appointment.  Please make sure when you call that you mention that you are scheduling your Annual Wellness Visit with the clinical pharmacist so that the appointment may be made for the proper length of time.     Continue current medications. Continue good therapeutic lifestyle changes which include good diet and exercise. Fall precautions discussed with patient. If an FOBT was given today- please return it to our front desk. If you are over 81 years old - you may need Prevnar 13 or the adult Pneumonia vaccine.  **Flu shots are available--- please call and schedule a FLU-CLINIC appointment**  After your visit with us today you will receive a survey in the mail or online from [employer] regarding your care with us. Please take a moment to fill this out. Your feedback is very  important to us as you can help us better understand your patient needs as well as improve your experience and satisfaction. WE CARE ABOUT YOU!!!   The patient will add Citrucel to her diet regimen 2-3 times weekly and will continue to use her walker on a regular basis to keep from falling She will continue to drink plenty of fluids and stay well hydrated

## 2017-05-01 LAB — LIPID PANEL
CHOLESTEROL TOTAL: 188 mg/dL (ref 100–199)
Chol/HDL Ratio: 2.7 ratio (ref 0.0–4.4)
HDL: 70 mg/dL (ref >39)
LDL CALC: 104 mg/dL — AB (ref 0–99)
Triglycerides: 71 mg/dL (ref 0–149)
VLDL Cholesterol Cal: 14 mg/dL (ref 5–40)

## 2017-05-01 LAB — CBC WITH DIFFERENTIAL/PLATELET
Basophils Absolute: 0 10*3/uL (ref 0.0–0.2)
Basos: 0 %
EOS (ABSOLUTE): 0.1 10*3/uL (ref 0.0–0.4)
EOS: 1 %
HEMATOCRIT: 38.5 % (ref 34.0–46.6)
HEMOGLOBIN: 12.8 g/dL (ref 11.1–15.9)
Immature Grans (Abs): 0 10*3/uL (ref 0.0–0.1)
Immature Granulocytes: 0 %
LYMPHS ABS: 1.2 10*3/uL (ref 0.7–3.1)
Lymphs: 26 %
MCH: 30.9 pg (ref 26.6–33.0)
MCHC: 33.2 g/dL (ref 31.5–35.7)
MCV: 93 fL (ref 79–97)
MONOCYTES: 10 %
Monocytes Absolute: 0.4 10*3/uL (ref 0.1–0.9)
NEUTROS PCT: 63 %
Neutrophils Absolute: 2.9 10*3/uL (ref 1.4–7.0)
Platelets: 213 10*3/uL (ref 150–379)
RBC: 4.14 x10E6/uL (ref 3.77–5.28)
RDW: 13.3 % (ref 12.3–15.4)
WBC: 4.6 10*3/uL (ref 3.4–10.8)

## 2017-05-01 LAB — BMP8+EGFR
BUN / CREAT RATIO: 18 (ref 12–28)
BUN: 9 mg/dL (ref 8–27)
CO2: 28 mmol/L (ref 18–29)
CREATININE: 0.49 mg/dL — AB (ref 0.57–1.00)
Calcium: 9.5 mg/dL (ref 8.7–10.3)
Chloride: 103 mmol/L (ref 96–106)
GFR calc Af Amer: 102 mL/min/{1.73_m2} (ref >59)
GFR, EST NON AFRICAN AMERICAN: 89 mL/min/{1.73_m2} (ref >59)
Glucose: 91 mg/dL (ref 65–99)
Potassium: 3.6 mmol/L (ref 3.5–5.2)
SODIUM: 144 mmol/L (ref 134–144)

## 2017-05-01 LAB — VITAMIN D 25 HYDROXY (VIT D DEFICIENCY, FRACTURES): VIT D 25 HYDROXY: 45 ng/mL (ref 30.0–100.0)

## 2017-05-01 LAB — HEPATIC FUNCTION PANEL
ALBUMIN: 3.9 g/dL (ref 3.5–4.7)
ALK PHOS: 62 IU/L (ref 39–117)
ALT: 13 IU/L (ref 0–32)
AST: 23 IU/L (ref 0–40)
BILIRUBIN, DIRECT: 0.16 mg/dL (ref 0.00–0.40)
Bilirubin Total: 0.5 mg/dL (ref 0.0–1.2)
Total Protein: 6.2 g/dL (ref 6.0–8.5)

## 2017-05-21 ENCOUNTER — Other Ambulatory Visit: Payer: Self-pay | Admitting: Family Medicine

## 2017-06-13 ENCOUNTER — Other Ambulatory Visit: Payer: Self-pay | Admitting: Family Medicine

## 2017-09-09 ENCOUNTER — Ambulatory Visit: Payer: Medicare Other | Admitting: Family Medicine

## 2017-09-29 ENCOUNTER — Ambulatory Visit (INDEPENDENT_AMBULATORY_CARE_PROVIDER_SITE_OTHER): Payer: Medicare Other | Admitting: Family Medicine

## 2017-09-29 ENCOUNTER — Encounter: Payer: Self-pay | Admitting: Family Medicine

## 2017-09-29 VITALS — BP 131/81 | HR 75 | Temp 97.4°F | Ht 59.0 in | Wt 128.0 lb

## 2017-09-29 DIAGNOSIS — I10 Essential (primary) hypertension: Secondary | ICD-10-CM

## 2017-09-29 DIAGNOSIS — E559 Vitamin D deficiency, unspecified: Secondary | ICD-10-CM | POA: Diagnosis not present

## 2017-09-29 DIAGNOSIS — R2681 Unsteadiness on feet: Secondary | ICD-10-CM

## 2017-09-29 DIAGNOSIS — E7800 Pure hypercholesterolemia, unspecified: Secondary | ICD-10-CM

## 2017-09-29 NOTE — Patient Instructions (Addendum)
Medicare Annual Wellness Visit  [place] and the medical providers at [facility] strive to bring you the best medical care.  In doing so we not only want to address your current medical conditions and concerns but also to detect new conditions early and prevent illness, disease and health-related problems.    Medicare offers a yearly Wellness Visit which allows our clinical staff to assess your need for preventative services including immunizations, lifestyle education, counseling to decrease risk of preventable diseases and screening for fall risk and other medical concerns.    This visit is provided free of charge (no copay) for all Medicare recipients. The clinical pharmacists at [facility] have begun to conduct these Wellness Visits which will also include a thorough review of all your medications.    As you primary medical provider recommend that you make an appointment for your Annual Wellness Visit if you have not done so already this year.  You may set up this appointment before you leave today or you may call back [phone]) and schedule an appointment.  Please make sure when you call that you mention that you are scheduling your Annual Wellness Visit with the clinical pharmacist so that the appointment may be made for the proper length of time.     Continue current medications. Continue good therapeutic lifestyle changes which include good diet and exercise. Fall precautions discussed with patient. If an FOBT was given today- please return it to our front desk. If you are over 106 years old - you may need Prevnar 13 or the adult Pneumonia vaccine.  **Flu shots are available--- please call and schedule a FLU-CLINIC appointment**  After your visit with [place] today you will receive a survey in the mail or online from [employer] regarding your care with [place]. Please take a moment to fill this out. Your feedback is very  important to [place] as you can help [place] better understand your patient needs as well as improve your experience and satisfaction. WE CARE ABOUT YOU!!!   Continue to use walker regularly Make every effort to move slowly so that you did not fall Continue to drink plenty of water and fluids and eat healthy We'll call with lab work results as soon as they become available

## 2017-09-29 NOTE — Progress Notes (Signed)
Subjective:    Patient ID: Melissa Hall, female    DOB: September 17, 1928, 83 y.o.   MRN: [id number]  HPI Pt here for follow up and management of chronic medical problems which includes hypertension and hyperlipidemia. She is taking medication regularly. I see this patient and her husband. The husband is concerned that her memory is declining. She knows the importance of not being outside the house without walking assistance but sometimes still does this. She's had multiple fractures in the past from falls. Her vital signs today are stable. She is on HCTZ and pravastatin for cholesterol and a coated baby aspirin along with multivitamins vitamin D and calcium. The patient denies any chest pain or shortness of breath. She is pleasant and alert and responded appropriately to all questions asked of her today. She denies any trouble with swallowing heartburn indigestion nausea vomiting diarrhea or blood in the stool. She is passing her water without problems. She drinks lots of water. She keeps a walker by her and uses it with every movement she says.     Patient Active Problem List   Diagnosis Date Noted  . Fracture of proximal end of left humerus 05/13/2014  . Traumatic closed displaced fracture of proximal end of left humerus 05/12/2014    Class: Acute  . Proximal humerus fracture 05/12/2014  . Osteoporosis, post-menopausal 01/25/2014  . Hyperlipidemia 12/14/2013  . Hypertension 12/14/2013  . Vitamin D deficiency 12/14/2013  . Scoliosis (and kyphoscoliosis), idiopathic 12/14/2013   Outpatient Encounter Prescriptions as of 09/29/2017  Medication Sig  . aspirin EC 81 MG tablet Take 81 mg by mouth daily.  . calcium citrate-vitamin D (CITRACAL+D) 315-200 MG-UNIT per tablet Take 1 tablet by mouth daily.   . cholecalciferol (VITAMIN D) 1000 UNITS tablet Take 3,000 Units by mouth daily.   . hydrochlorothiazide (MICROZIDE) 12.5 MG capsule TAKE (1) CAPSULE DAILY  . Multiple Vitamins-Minerals  (MULTIVITAMIN WITH MINERALS) tablet Take 1 tablet by mouth daily.  . pravastatin (PRAVACHOL) 10 MG tablet TAKE 1 TABLET DAILY   No facility-administered encounter medications on file as of 09/29/2017.       Review of Systems  Constitutional: Negative.   HENT: Negative.   Eyes: Negative.   Respiratory: Negative.   Cardiovascular: Negative.   Gastrointestinal: Negative.   Endocrine: Negative.   Genitourinary: Negative.   Musculoskeletal: Negative.   Skin: Negative.   Allergic/Immunologic: Negative.   Neurological: Negative.   Hematological: Negative.   Psychiatric/Behavioral: Negative.        Objective:   Physical Exam  Constitutional: She is oriented to person, place, and time. No distress.  Elderly and pleasant and alert today.  HENT:  Head: Normocephalic and atraumatic.  Right Ear: External ear normal.  Left Ear: External ear normal.  Nose: Nose normal.  Mouth/Throat: Oropharynx is clear and moist. No oropharyngeal exudate.  Well-hydrated  Eyes: Pupils are equal, round, and reactive to light. Conjunctivae and EOM are normal. Right eye exhibits no discharge. Left eye exhibits no discharge. No scleral icterus.  Neck: Normal range of motion. Neck supple. No thyromegaly present.  No bruits thyromegaly or anterior cervical adenopathy  Cardiovascular: Normal rate and normal heart sounds.   No murmur heard. The heart was slightly irregular at 72/m  Pulmonary/Chest: Effort normal and breath sounds normal. No respiratory distress. She has no wheezes. She has no rales.  Clear anteriorly and posteriorly  Abdominal: Soft. Bowel sounds are normal. She exhibits no mass. There is no tenderness. There is no rebound and no  guarding.  No abdominal tenderness or masses  Musculoskeletal: She exhibits no edema.  The patient uses a walker for ambulation  Lymphadenopathy:    She has no cervical adenopathy.  Neurological: She is alert and oriented to person, place, and time.  Skin: Skin is  warm and dry. No rash noted.  Psychiatric: She has a normal mood and affect. Her behavior is normal. Judgment and thought content normal.  The patient was alert today and as mentioned earlier responded appropriately to questions asked of her. Her husband says she has good long-term memory but poor short-term memory but that was not obvious today.  Nursing note and vitals reviewed.   BP 131/81 (BP Location: Left Arm)   Pulse 75   Temp (!) 97.4 F (36.3 C) (Oral)   Ht 4' 11"  (1.499 m)   Wt 128 lb (58.1 kg)   BMI 25.85 kg/m        Assessment & Plan:  1. Pure hypercholesterolemia -Continue current treatment pending results of lab work - CBC with Differential/Platelet - Lipid panel  2. Essential hypertension -The blood pressure is good today and she will continue with current treatment - CBC with Differential/Platelet - BMP8+EGFR - Hepatic function panel  3. Vitamin D deficiency -Continue with calcium and vitamin D replacement pending results of lab work - CBC with Differential/Platelet - VITAMIN D 25 Hydroxy (Vit-D Deficiency, Fractures)  4. Gait instability -Continue to use walker regularly and make all efforts to keep herself from falling - CBC with Differential/Platelet  No orders of the defined types were placed in this encounter.  Patient Instructions                       Medicare Annual Wellness Visit  [place] and the medical providers at [place] strive to bring you the best medical care.  In doing so we not only want to address your current medical conditions and concerns but also to detect new conditions early and prevent illness, disease and health-related problems.    Medicare offers a yearly Wellness Visit which allows our clinical staff to assess your need for preventative services including immunizations, lifestyle education, counseling to decrease risk of preventable diseases and screening for fall risk and other medical  concerns.    This visit is provided free of charge (no copay) for all Medicare recipients. The clinical pharmacists at [place] have begun to conduct these Wellness Visits which will also include a thorough review of all your medications.    As you primary medical provider recommend that you make an appointment for your Annual Wellness Visit if you have not done so already this year.  You may set up this appointment before you leave today or you may call back [phone]) and schedule an appointment.  Please make sure when you call that you mention that you are scheduling your Annual Wellness Visit with the clinical pharmacist so that the appointment may be made for the proper length of time.     Continue current medications. Continue good therapeutic lifestyle changes which include good diet and exercise. Fall precautions discussed with patient. If an FOBT was given today- please return it to our front desk. If you are over 69 years old - you may need Prevnar 35 or the adult Pneumonia vaccine.  **Flu shots are available--- please call and schedule a FLU-CLINIC appointment**  After your visit with [place] today you will receive a survey in the mail  or online from [employer] regarding your care with [place]. Please take a moment to fill this out. Your feedback is very important to [place] as you can help [place] better understand your patient needs as well as improve your experience and satisfaction. WE CARE ABOUT YOU!!!   Continue to use walker regularly Make every effort to move slowly so that you did not fall Continue to drink plenty of water and fluids and eat healthy We'll call with lab work results as soon as they become available   Arrie Senate MD

## 2017-09-30 LAB — CBC WITH DIFFERENTIAL/PLATELET
BASOS: 0 %
Basophils Absolute: 0 10*3/uL (ref 0.0–0.2)
EOS (ABSOLUTE): 0.1 10*3/uL (ref 0.0–0.4)
EOS: 1 %
HEMATOCRIT: 36.7 % (ref 34.0–46.6)
Hemoglobin: 12.2 g/dL (ref 11.1–15.9)
IMMATURE GRANS (ABS): 0 10*3/uL (ref 0.0–0.1)
IMMATURE GRANULOCYTES: 0 %
LYMPHS: 31 %
Lymphocytes Absolute: 1.5 10*3/uL (ref 0.7–3.1)
MCH: 30.7 pg (ref 26.6–33.0)
MCHC: 33.2 g/dL (ref 31.5–35.7)
MCV: 92 fL (ref 79–97)
Monocytes Absolute: 0.4 10*3/uL (ref 0.1–0.9)
Monocytes: 8 %
NEUTROS PCT: 60 %
Neutrophils Absolute: 2.9 10*3/uL (ref 1.4–7.0)
PLATELETS: 227 10*3/uL (ref 150–379)
RBC: 3.98 x10E6/uL (ref 3.77–5.28)
RDW: 13.5 % (ref 12.3–15.4)
WBC: 4.8 10*3/uL (ref 3.4–10.8)

## 2017-09-30 LAB — VITAMIN D 25 HYDROXY (VIT D DEFICIENCY, FRACTURES): VIT D 25 HYDROXY: 55.4 ng/mL (ref 30.0–100.0)

## 2017-09-30 LAB — BMP8+EGFR
BUN/Creatinine Ratio: 14 (ref 12–28)
BUN: 8 mg/dL (ref 8–27)
CALCIUM: 9.3 mg/dL (ref 8.7–10.3)
CHLORIDE: 104 mmol/L (ref 96–106)
CO2: 27 mmol/L (ref 20–29)
Creatinine, Ser: 0.58 mg/dL (ref 0.57–1.00)
GFR calc non Af Amer: 83 mL/min/{1.73_m2} (ref >59)
GFR, EST AFRICAN AMERICAN: 96 mL/min/{1.73_m2} (ref >59)
Glucose: 86 mg/dL (ref 65–99)
Potassium: 4.1 mmol/L (ref 3.5–5.2)
Sodium: 145 mmol/L — ABNORMAL HIGH (ref 134–144)

## 2017-09-30 LAB — LIPID PANEL
Chol/HDL Ratio: 3 ratio (ref 0.0–4.4)
Cholesterol, Total: 189 mg/dL (ref 100–199)
HDL: 63 mg/dL (ref >39)
LDL Calculated: 111 mg/dL — ABNORMAL HIGH (ref 0–99)
TRIGLYCERIDES: 77 mg/dL (ref 0–149)
VLDL CHOLESTEROL CAL: 15 mg/dL (ref 5–40)

## 2017-09-30 LAB — HEPATIC FUNCTION PANEL
ALK PHOS: 56 IU/L (ref 39–117)
ALT: 14 IU/L (ref 0–32)
AST: 26 IU/L (ref 0–40)
Albumin: 3.9 g/dL (ref 3.5–4.7)
BILIRUBIN, DIRECT: 0.18 mg/dL (ref 0.00–0.40)
Bilirubin Total: 0.5 mg/dL (ref 0.0–1.2)
Total Protein: 5.9 g/dL — ABNORMAL LOW (ref 6.0–8.5)

## 2017-10-13 ENCOUNTER — Encounter: Payer: Self-pay | Admitting: *Deleted

## 2017-10-13 ENCOUNTER — Ambulatory Visit (INDEPENDENT_AMBULATORY_CARE_PROVIDER_SITE_OTHER): Payer: Medicare Other | Admitting: *Deleted

## 2017-10-13 VITALS — BP 128/72 | HR 70 | Ht <= 58 in | Wt 129.0 lb

## 2017-10-13 DIAGNOSIS — Z0000 Encounter for general adult medical examination without abnormal findings: Secondary | ICD-10-CM

## 2017-10-13 NOTE — Progress Notes (Addendum)
Subjective:   Melissa Hall is a 83 y.o. female who presents for an Initial Medicare Annual Wellness Visit. Melissa Hall is accompanied today by her husband. They live at home independently. He has some concerns about her short term memory. They have two adult children and 5 grandchildren.   Review of Systems    Reports that her health is about the same as last year.   Cardiac Risk Factors include: advanced age (>83men, >62 women);dyslipidemia;family history of premature cardiovascular disease;hypertension;sedentary lifestyle  Other systems negative today    Objective:    BP 128/72 (BP Location: Left Arm, Patient Position: Sitting, Cuff Size: Normal)   Pulse 70   Ht 4\' 10"  (1.473 m)   Wt 129 lb (58.5 kg)   BMI 26.96 kg/m    Current Medications (verified) Outpatient Encounter Prescriptions as of 10/13/2017  Medication Sig  . aspirin EC 81 MG tablet Take 81 mg by mouth daily.  . calcium citrate-vitamin D (CITRACAL+D) 315-200 MG-UNIT per tablet Take 1 tablet by mouth daily.   . cholecalciferol (VITAMIN D) 1000 UNITS tablet Take 3,000 Units by mouth daily.   . hydrochlorothiazide (MICROZIDE) 12.5 MG capsule TAKE (1) CAPSULE DAILY  . Multiple Vitamins-Minerals (MULTIVITAMIN WITH MINERALS) tablet Take 1 tablet by mouth daily.  . Omega-3 Fatty Acids (FISH OIL PO) Take by mouth.  . pravastatin (PRAVACHOL) 10 MG tablet TAKE 1 TABLET DAILY   No facility-administered encounter medications on file as of 10/13/2017.     Allergies (verified) Keflex [cephalexin]   History: Past Medical History:  Diagnosis Date  . Arthritis   . Cataract   . GERD (gastroesophageal reflux disease)   . High cholesterol   . Hypertension   . Osteoporosis    Past Surgical History:  Procedure Laterality Date  . ABDOMINAL HYSTERECTOMY    . APPENDECTOMY    . BACK SURGERY    . CATARACT EXTRACTION W/PHACO  10/18/2012   Procedure: CATARACT EXTRACTION PHACO AND INTRAOCULAR LENS PLACEMENT (IOC);  Surgeon:  Gemma Payor, MD;  Location: [facility];  Service: Ophthalmology;  Laterality: Left;  CDE:17.69  . COLONOSCOPY    . EYE SURGERY    . JOINT REPLACEMENT     bilat  . ORIF HUMERUS FRACTURE Left 05/12/2014   Procedure: OPEN REDUCTION INTERNAL FIXATION (ORIF) PROXIMAL HUMERUS FRACTURE;  Surgeon: Eldred Manges, MD;  Location: [facility];  Service: Orthopedics;  Laterality: Left;  Open Reduction Internal Fixation Left Proximal Humerus  . TONSILLECTOMY     Family History  Problem Relation Age of Onset  . Heart disease Mother   . Osteoporosis Mother   . Heart disease Father   . Cancer Father   . Stroke Father   . Suicidality Brother   . Suicidality Daughter    Social History   Occupational History  . Not on file.   Social History Main Topics  . Smoking status: Never Smoker  . Smokeless tobacco: Never Used  . Alcohol use No  . Drug use: No  . Sexual activity: No    Tobacco Counseling No tobacco use  Activities of Daily Living In your present state of health, do you have any difficulty performing the following activities: 10/13/2017  Hearing? Y  Vision? N  Difficulty concentrating or making decisions? Y  Comment some problems with short term memory  Walking or climbing stairs? Y  Comment Steps at home. Holds the railing  Dressing or bathing? N  Doing errands, shopping? Y  Comment Has help from husband  Preparing Food and eating ? N  Using the Toilet? N  In the past six months, have you accidently leaked urine? N  Do you have problems with loss of bowel control? N  Managing your Medications? N  Managing your Finances? N  Housekeeping or managing your Housekeeping? N  Some recent data might be hidden    Immunizations and Health Maintenance Immunization History  Administered Date(s) Administered  . Influenza Split 09/20/2013  . Influenza,inj,Quad PF,6+ Mos 09/22/2017  . Influenza-Unspecified 10/10/2014, 10/01/2015, 10/01/2016  . Pneumococcal Conjugate-13 12/14/2013  . Pneumococcal  Polysaccharide-23 08/10/2012   There are no preventive care reminders to display for this patient.  Patient Care Team: Ernestina PennaMoore, Donald W, MD as PCP - General (Family Medicine) Michaelle CopasLe, Yen Thi Hong, MD as Referring Physician (Optometry) Marjory LiesPenumalli, Glenford BayleyVikram R, MD as Consulting Physician (Neurology)  No hospitalizations, ER visits, or surgeries this past year.      Assessment:   This is a routine wellness examination for [facility].   Hearing/Vision screen No deficit noted during visit. Eye exam is due.   Dietary issues and exercise activities discussed: Current Exercise Habits: Home exercise routine, Type of exercise: walking, Time (Minutes): 10, Frequency (Times/Week): 3, Weekly Exercise (Minutes/Week): 30, Intensity: Mild, Exercise limited by: orthopedic condition(s)  Goals    . Exercise 150 minutes per week (moderate activity)      Depression Screen PHQ 2/9 Scores 10/13/2017 09/29/2017 04/30/2017 11/25/2016 06/25/2016 02/01/2016 10/09/2015  PHQ - 2 Score 0 0 0 0 0 0 0    Fall Risk Fall Risk  10/13/2017 09/29/2017 04/30/2017 11/25/2016 06/25/2016  Falls in the past year? No No Yes No Yes  Number falls in past yr: - - 1 - 2 or more  Comment - - - - -  Injury with Fall? - - No - -  Risk Factor Category  - - - - -  Risk for fall due to : History of fall(s) - - - Impaired mobility    Cognitive Function: MMSE - Mini Mental State Exam 10/13/2017 07/05/2015  Orientation to time 3 4  Orientation to Place 5 5  Registration 3 3  Attention/ Calculation 4 4  Recall 0 3  Language- name 2 objects 2 2  Language- repeat 1 1  Language- follow 3 step command 2 2  Language- read & follow direction 1 1  Write a sentence 1 1  Copy design 0 0  Total score 22 26  Husband has some concerns about short term memory. I would recommend a follow up based on results      Screening Tests Health Maintenance  Topic Date Due  . TETANUS/TDAP  11/17/2017 (Originally 09/21/2016)  . DEXA SCAN  01/31/2018  . INFLUENZA  VACCINE  Completed  . PNA vac Low Risk Adult  Completed      Plan:  Exercise daily. Handout on chair exercises given and reviewed.  Keep f/u with PCP Move carefully to avoid falls  I have personally reviewed and noted the following in the patient's chart:   . Medical and social history . Use of alcohol, tobacco or illicit drugs  . Current medications and supplements . Functional ability and status . Nutritional status . Physical activity . Advanced directives . List of other physicians . Hospitalizations, surgeries, and ER visits in previous 12 months . Vitals . Screenings to include cognitive, depression, and falls . Referrals and appointments  In addition, I have reviewed and discussed with patient certain preventive protocols, quality metrics, and best practice  recommendations. A written personalized care plan for preventive services as well as general preventive health recommendations were provided to patient.     [name], RN  10/13/2017   I have reviewed and agree with the above AWV documentation.  Nyra Capes MD

## 2017-10-13 NOTE — Patient Instructions (Signed)
  Ms. Mayford KnifeWilliams , Thank you for taking time to come for your Medicare Wellness Visit. I appreciate your ongoing commitment to your health goals. Please review the following plan we discussed and let me know if I can assist you in the future.   These are the goals we discussed: Goals    . Exercise 150 minutes per week (moderate activity)       This is a list of the screening recommended for you and due dates:  Health Maintenance  Topic Date Due  . Tetanus Vaccine  11/17/2017*  . DEXA scan (bone density measurement)  01/31/2018  . Flu Shot  Completed  . Pneumonia vaccines  Completed  *Topic was postponed. The date shown is not the original due date.

## 2017-11-13 ENCOUNTER — Other Ambulatory Visit: Payer: Self-pay | Admitting: Family Medicine

## 2017-11-19 ENCOUNTER — Other Ambulatory Visit: Payer: Self-pay | Admitting: Family Medicine

## 2017-12-30 DIAGNOSIS — H40033 Anatomical narrow angle, bilateral: Secondary | ICD-10-CM | POA: Diagnosis not present

## 2017-12-30 DIAGNOSIS — H2513 Age-related nuclear cataract, bilateral: Secondary | ICD-10-CM | POA: Diagnosis not present

## 2018-01-15 DIAGNOSIS — H43823 Vitreomacular adhesion, bilateral: Secondary | ICD-10-CM | POA: Diagnosis not present

## 2018-01-15 DIAGNOSIS — H2511 Age-related nuclear cataract, right eye: Secondary | ICD-10-CM | POA: Diagnosis not present

## 2018-01-15 DIAGNOSIS — Z961 Presence of intraocular lens: Secondary | ICD-10-CM | POA: Diagnosis not present

## 2018-01-15 DIAGNOSIS — H25811 Combined forms of age-related cataract, right eye: Secondary | ICD-10-CM | POA: Diagnosis not present

## 2018-02-05 DIAGNOSIS — H04123 Dry eye syndrome of bilateral lacrimal glands: Secondary | ICD-10-CM | POA: Diagnosis not present

## 2018-02-12 ENCOUNTER — Ambulatory Visit (INDEPENDENT_AMBULATORY_CARE_PROVIDER_SITE_OTHER): Payer: Medicare Other | Admitting: Family Medicine

## 2018-02-12 ENCOUNTER — Encounter: Payer: Self-pay | Admitting: Family Medicine

## 2018-02-12 VITALS — BP 124/88 | HR 76 | Temp 96.5°F | Ht <= 58 in | Wt 130.0 lb

## 2018-02-12 DIAGNOSIS — R2681 Unsteadiness on feet: Secondary | ICD-10-CM

## 2018-02-12 DIAGNOSIS — E7800 Pure hypercholesterolemia, unspecified: Secondary | ICD-10-CM

## 2018-02-12 DIAGNOSIS — I10 Essential (primary) hypertension: Secondary | ICD-10-CM

## 2018-02-12 DIAGNOSIS — E559 Vitamin D deficiency, unspecified: Secondary | ICD-10-CM | POA: Diagnosis not present

## 2018-02-12 DIAGNOSIS — M4120 Other idiopathic scoliosis, site unspecified: Secondary | ICD-10-CM | POA: Diagnosis not present

## 2018-02-12 NOTE — Patient Instructions (Addendum)
Medicare Annual Wellness Visit  [place] and the medical providers at [facility] strive to bring you the best medical care.  In doing so we not only want to address your current medical conditions and concerns but also to detect new conditions early and prevent illness, disease and health-related problems.    Medicare offers a yearly Wellness Visit which allows our clinical staff to assess your need for preventative services including immunizations, lifestyle education, counseling to decrease risk of preventable diseases and screening for fall risk and other medical concerns.    This visit is provided free of charge (no copay) for all Medicare recipients. The clinical pharmacists at [facility] have begun to conduct these Wellness Visits which will also include a thorough review of all your medications.    As you primary medical provider recommend that you make an appointment for your Annual Wellness Visit if you have not done so already this year.  You may set up this appointment before you leave today or you may call back [phone] and schedule an appointment.  Please make sure when you call that you mention that you are scheduling your Annual Wellness Visit with the clinical pharmacist so that the appointment may be made for the proper length of time.     Continue current medications. Continue good therapeutic lifestyle changes which include good diet and exercise. Fall precautions discussed with patient. If an FOBT was given today- please return it to our front desk. If you are over 82 years old - you may need Prevnar 13 or the adult Pneumonia vaccine.  **Flu shots are available--- please call and schedule a FLU-CLINIC appointment**  After your visit with us today you will receive a survey in the mail or online from [employer] regarding your care with us. Please take a moment to fill this out. Your feedback is very  important to us as you can help us better understand your patient needs as well as improve your experience and satisfaction. WE CARE ABOUT YOU!!!   Use your walker all the time Drink more water Stay as active as possible in the house Continue to be careful not to fall We will call with lab work as soon as the results become available

## 2018-02-12 NOTE — Progress Notes (Signed)
Subjective:    Patient ID: Melissa Hall, female    DOB: [birth year], 84 y.o.   MRN: [id number]  HPI Pt here for follow up and management of chronic medical problems which includes hypertension and hyperlipidemia. She is taking medication regularly.  Melissa Hall comes to the visit today for follow-up of her ongoing problems.  She has no complaints and does not need any refills.  She does not want to do any more DEXA scans and does not want to do any more pelvic exams and mammograms.  She is up-to-date on her chest x-ray and would be given an FOBT to return as well as get lab work today.  Her husband keeps a close eye on her and she has become more fragile and more gait instability has developed.  She has osteoporosis hypertension and hyperlipidemia.  She is on pravastatin and omega-3 fatty acids and a low dose of HCTZ along with vitamin D and calcium.  Patient looks good for her age and seems to be very alert today.  She is 84 years old.  She says she had one fall back in December and did not have her rolling walker with her and fell backward but did not hurt herself.  She has since been even more careful to keep the walker with her even at nighttime when she gets out of bed to go to the bathroom.  She denies any chest pain pressure tightness palpitations or shortness of breath anymore than usual.  She has no trouble with her stomach or change in bowel habits and denies trouble with swallowing heartburn indigestion nausea vomiting diarrhea or blood in the stool.  She is passing her water well and admits to not drinking enough fluids and will try to do better with this.     Patient Active Problem List   Diagnosis Date Noted  . Fracture of proximal end of left humerus 05/13/2014  . Traumatic closed displaced fracture of proximal end of left humerus 05/12/2014    Class: Acute  . Proximal humerus fracture 05/12/2014  . Osteoporosis, post-menopausal 01/25/2014  . Hyperlipidemia 12/14/2013  .  Hypertension 12/14/2013  . Vitamin D deficiency 12/14/2013  . Scoliosis (and kyphoscoliosis), idiopathic 12/14/2013   Outpatient Encounter Medications as of 02/12/2018  Medication Sig  . aspirin EC 81 MG tablet Take 81 mg by mouth daily.  . calcium citrate-vitamin D (CITRACAL+D) 315-200 MG-UNIT per tablet Take 1 tablet by mouth daily.   . cholecalciferol (VITAMIN D) 1000 UNITS tablet Take 3,000 Units by mouth daily.   . hydrochlorothiazide (MICROZIDE) 12.5 MG capsule TAKE (1) CAPSULE DAILY  . Multiple Vitamins-Minerals (MULTIVITAMIN WITH MINERALS) tablet Take 1 tablet by mouth daily.  . Omega-3 Fatty Acids (FISH OIL PO) Take by mouth.  . pravastatin (PRAVACHOL) 10 MG tablet TAKE 1 TABLET DAILY  . [DISCONTINUED] pravastatin (PRAVACHOL) 10 MG tablet TAKE 1 TABLET DAILY   No facility-administered encounter medications on file as of 02/12/2018.      Review of Systems  Constitutional: Negative.   HENT: Negative.   Eyes: Negative.   Respiratory: Negative.   Cardiovascular: Negative.   Gastrointestinal: Negative.   Endocrine: Negative.   Genitourinary: Negative.   Musculoskeletal: Negative.   Skin: Negative.   Allergic/Immunologic: Negative.   Neurological: Negative.   Hematological: Negative.   Psychiatric/Behavioral: Negative.        Objective:   Physical Exam  Constitutional: She is oriented to person, place, and time. She appears well-developed and well-nourished.  The patient is  elderly but pleasant and answering questions appropriately.  She looks very nice for her age.  HENT:  Head: Normocephalic and atraumatic.  Right Ear: External ear normal.  Left Ear: External ear normal.  Nose: Nose normal.  Mouth/Throat: Oropharynx is clear and moist. No oropharyngeal exudate.  Eyes: Conjunctivae and EOM are normal. Pupils are equal, round, and reactive to light. Right eye exhibits no discharge. Left eye exhibits no discharge. No scleral icterus.  She is recently had an eye exam  visit and is waiting to replace 1 of her lenses.  Neck: Normal range of motion. Neck supple. No thyromegaly present.  Cardiovascular: Normal rate, normal heart sounds and intact distal pulses.  No murmur heard. Heart was slightly irregular at 72/min  Pulmonary/Chest: Effort normal and breath sounds normal. No respiratory distress. She has no wheezes. She has no rales.  Clear anteriorly and posteriorly  Abdominal: Soft. Bowel sounds are normal. She exhibits no mass. There is no tenderness. There is no rebound and no guarding.  The abdomen was soft without organ enlargement masses bruits or inguinal adenopathy  Genitourinary:  Genitourinary Comments: The patient says she checks her breasts regularly when she is bathing.  Musculoskeletal: She exhibits deformity. She exhibits no edema or tenderness.  The patient has a somewhat kyphotic posture but moves fairly well with her walker.  Lymphadenopathy:    She has no cervical adenopathy.  Neurological: She is alert and oriented to person, place, and time. She has normal reflexes. No cranial nerve deficit.  Skin: Skin is warm and dry. No rash noted.  Psychiatric: She has a normal mood and affect. Her behavior is normal. Judgment and thought content normal.  Nursing note and vitals reviewed.  BP 124/88 (BP Location: Left Arm)   Pulse 76   Temp (!) 96.5 F (35.8 C) (Oral)   Ht _0  (1.473 m)   Wt 130 lb (59 kg)   BMI 27.17 kg/m         Assessment & Plan:  1. Pure hypercholesterolemia -Continue with pravastatin and with as aggressive therapeutic lifestyle changes as possible - CBC with Differential/Platelet - Lipid panel  2. Essential hypertension -The blood pressure is good today she will continue with current treatment - CBC with Differential/Platelet - BMP8+EGFR - Hepatic function panel  3. Vitamin D deficiency -Continue with vitamin D replacement and calcium pending results of lab work - CBC with Differential/Platelet -  VITAMIN D 25 Hydroxy (Vit-D Deficiency, Fractures)  4.  Frailty -The patient seems better today with her stamina and strength than in the past.  Is mostly due to her scoliosis and kyphoscoliosis.  Patient Instructions                       Medicare Annual Wellness Visit  [place] and the medical providers at [facility] strive to bring you the best medical care.  In doing so we not only want to address your current medical conditions and concerns but also to detect new conditions early and prevent illness, disease and health-related problems.    Medicare offers a yearly Wellness Visit which allows our clinical staff to assess your need for preventative services including immunizations, lifestyle education, counseling to decrease risk of preventable diseases and screening for fall risk and other medical concerns.    This visit is provided free of charge (no copay) for all Medicare recipients. The clinical pharmacists at [facility] have begun to conduct these Wellness Visits  which will also include a thorough review of all your medications.    As you primary medical provider recommend that you make an appointment for your Annual Wellness Visit if you have not done so already this year.  You may set up this appointment before you leave today or you may call back [phone]) and schedule an appointment.  Please make sure when you call that you mention that you are scheduling your Annual Wellness Visit with the clinical pharmacist so that the appointment may be made for the proper length of time.     Continue current medications. Continue good therapeutic lifestyle changes which include good diet and exercise. Fall precautions discussed with patient. If an FOBT was given today- please return it to our front desk. If you are over 24 years old - you may need Prevnar 68 or the adult Pneumonia vaccine.  **Flu shots are available--- please call and  schedule a FLU-CLINIC appointment**  After your visit with [place] today you will receive a survey in the mail or online from [employer] regarding your care with [place]. Please take a moment to fill this out. Your feedback is very important to [place] as you can help [place] better understand your patient needs as well as improve your experience and satisfaction. WE CARE ABOUT YOU!!!   Use your walker all the time Drink more water Stay as active as possible in the house Continue to be careful not to fall We will call with lab work as soon as the results become available  Arrie Senate MD

## 2018-02-13 LAB — LIPID PANEL
CHOLESTEROL TOTAL: 186 mg/dL (ref 100–199)
Chol/HDL Ratio: 2.6 ratio (ref 0.0–4.4)
HDL: 72 mg/dL (ref >39)
LDL Calculated: 101 mg/dL — ABNORMAL HIGH (ref 0–99)
TRIGLYCERIDES: 63 mg/dL (ref 0–149)
VLDL CHOLESTEROL CAL: 13 mg/dL (ref 5–40)

## 2018-02-13 LAB — CBC WITH DIFFERENTIAL/PLATELET
BASOS ABS: 0 10*3/uL (ref 0.0–0.2)
Basos: 1 %
EOS (ABSOLUTE): 0 10*3/uL (ref 0.0–0.4)
Eos: 0 %
HEMOGLOBIN: 13 g/dL (ref 11.1–15.9)
Hematocrit: 38.9 % (ref 34.0–46.6)
IMMATURE GRANS (ABS): 0 10*3/uL (ref 0.0–0.1)
IMMATURE GRANULOCYTES: 0 %
LYMPHS: 25 %
Lymphocytes Absolute: 1.2 10*3/uL (ref 0.7–3.1)
MCH: 31.1 pg (ref 26.6–33.0)
MCHC: 33.4 g/dL (ref 31.5–35.7)
MCV: 93 fL (ref 79–97)
MONOCYTES: 8 %
Monocytes Absolute: 0.4 10*3/uL (ref 0.1–0.9)
NEUTROS PCT: 66 %
Neutrophils Absolute: 3.2 10*3/uL (ref 1.4–7.0)
PLATELETS: 200 10*3/uL (ref 150–379)
RBC: 4.18 x10E6/uL (ref 3.77–5.28)
RDW: 13.5 % (ref 12.3–15.4)
WBC: 4.8 10*3/uL (ref 3.4–10.8)

## 2018-02-13 LAB — BMP8+EGFR
BUN/Creatinine Ratio: 15 (ref 12–28)
BUN: 9 mg/dL (ref 8–27)
CALCIUM: 9.8 mg/dL (ref 8.7–10.3)
CHLORIDE: 103 mmol/L (ref 96–106)
CO2: 24 mmol/L (ref 20–29)
CREATININE: 0.59 mg/dL (ref 0.57–1.00)
GFR calc Af Amer: 95 mL/min/{1.73_m2} (ref >59)
GFR calc non Af Amer: 83 mL/min/{1.73_m2} (ref >59)
GLUCOSE: 87 mg/dL (ref 65–99)
Potassium: 3.8 mmol/L (ref 3.5–5.2)
Sodium: 144 mmol/L (ref 134–144)

## 2018-02-13 LAB — HEPATIC FUNCTION PANEL
ALT: 15 IU/L (ref 0–32)
AST: 25 IU/L (ref 0–40)
Albumin: 4.1 g/dL (ref 3.5–4.7)
Alkaline Phosphatase: 58 IU/L (ref 39–117)
BILIRUBIN TOTAL: 0.6 mg/dL (ref 0.0–1.2)
BILIRUBIN, DIRECT: 0.2 mg/dL (ref 0.00–0.40)
TOTAL PROTEIN: 6.7 g/dL (ref 6.0–8.5)

## 2018-02-13 LAB — VITAMIN D 25 HYDROXY (VIT D DEFICIENCY, FRACTURES): VIT D 25 HYDROXY: 43.1 ng/mL (ref 30.0–100.0)

## 2018-02-15 ENCOUNTER — Ambulatory Visit: Payer: Medicare Other | Admitting: Family Medicine

## 2018-03-12 DIAGNOSIS — H04123 Dry eye syndrome of bilateral lacrimal glands: Secondary | ICD-10-CM | POA: Diagnosis not present

## 2018-04-06 ENCOUNTER — Encounter: Payer: Self-pay | Admitting: *Deleted

## 2018-04-27 ENCOUNTER — Encounter: Payer: Self-pay | Admitting: *Deleted

## 2018-05-18 ENCOUNTER — Other Ambulatory Visit: Payer: Self-pay | Admitting: Family Medicine

## 2018-06-01 DIAGNOSIS — M79674 Pain in right toe(s): Secondary | ICD-10-CM | POA: Diagnosis not present

## 2018-06-01 DIAGNOSIS — M2041 Other hammer toe(s) (acquired), right foot: Secondary | ICD-10-CM | POA: Diagnosis not present

## 2018-07-06 ENCOUNTER — Ambulatory Visit: Payer: Medicare Other | Admitting: Family Medicine

## 2018-07-19 ENCOUNTER — Other Ambulatory Visit: Payer: Self-pay | Admitting: Family Medicine

## 2018-07-19 ENCOUNTER — Ambulatory Visit: Payer: Medicare Other | Admitting: Family Medicine

## 2018-07-29 ENCOUNTER — Encounter: Payer: Self-pay | Admitting: Family Medicine

## 2018-07-29 ENCOUNTER — Ambulatory Visit (INDEPENDENT_AMBULATORY_CARE_PROVIDER_SITE_OTHER): Payer: Medicare Other | Admitting: Family Medicine

## 2018-07-29 ENCOUNTER — Ambulatory Visit (INDEPENDENT_AMBULATORY_CARE_PROVIDER_SITE_OTHER): Payer: Medicare Other

## 2018-07-29 VITALS — BP 116/78 | HR 55 | Temp 97.7°F | Ht <= 58 in | Wt 128.0 lb

## 2018-07-29 DIAGNOSIS — Z13820 Encounter for screening for osteoporosis: Secondary | ICD-10-CM

## 2018-07-29 DIAGNOSIS — G8929 Other chronic pain: Secondary | ICD-10-CM

## 2018-07-29 DIAGNOSIS — E559 Vitamin D deficiency, unspecified: Secondary | ICD-10-CM | POA: Diagnosis not present

## 2018-07-29 DIAGNOSIS — I499 Cardiac arrhythmia, unspecified: Secondary | ICD-10-CM

## 2018-07-29 DIAGNOSIS — M25562 Pain in left knee: Secondary | ICD-10-CM | POA: Diagnosis not present

## 2018-07-29 DIAGNOSIS — E7800 Pure hypercholesterolemia, unspecified: Secondary | ICD-10-CM | POA: Diagnosis not present

## 2018-07-29 DIAGNOSIS — I10 Essential (primary) hypertension: Secondary | ICD-10-CM

## 2018-07-29 DIAGNOSIS — Z471 Aftercare following joint replacement surgery: Secondary | ICD-10-CM | POA: Diagnosis not present

## 2018-07-29 DIAGNOSIS — R296 Repeated falls: Secondary | ICD-10-CM

## 2018-07-29 DIAGNOSIS — R2681 Unsteadiness on feet: Secondary | ICD-10-CM | POA: Diagnosis not present

## 2018-07-29 DIAGNOSIS — Z96652 Presence of left artificial knee joint: Secondary | ICD-10-CM | POA: Diagnosis not present

## 2018-07-29 DIAGNOSIS — Z780 Asymptomatic menopausal state: Secondary | ICD-10-CM

## 2018-07-29 DIAGNOSIS — S2002XA Contusion of left breast, initial encounter: Secondary | ICD-10-CM

## 2018-07-29 NOTE — Progress Notes (Signed)
Subjective:    Patient ID: Melissa Hall, female    DOB: 1928/06/22, 84 y.o.   MRN: [id number]  HPI Pt here for follow up and management of chronic medical problems which includes hypertension and hyperlipidemia. She is taking medication regularly.  Patient is doing well.  She did have a recent fall but is doing okay from this.  She will get a DEXA scan today will be given an FOBT to return and will get lab work today.  Her vital signs are stable including her blood pressure and weight.  The patient has had a couple of recent falls the latest being yesterday.  She says it is due to the fact she does not lock the rolling walker and loses her balance and falls forward and pushes the walker out from under her and landed on her chest yesterday and bruised her left breast.  Today she says she is not that sore other than having the bruise on her left breast.  She has had both knees replaced and the left one has been worked on twice.  She is also had some left shoulder surgery.  She feels like there is some swelling in the left knee and on the physical exam I agree with that.  She denies any chest pain pressure tightness or shortness of breath.  She denies any trouble with her stomach including nausea vomiting diarrhea blood in the stool or black tarry bowel movements and says she is passing her water without problems.  On her last couple of EKGs that were reviewed she did have an atrial rhythm and normal sinus rhythm.    Patient Active Problem List   Diagnosis Date Noted  . Fracture of proximal end of left humerus 05/13/2014  . Traumatic closed displaced fracture of proximal end of left humerus 05/12/2014    Class: Acute  . Proximal humerus fracture 05/12/2014  . Osteoporosis, post-menopausal 01/25/2014  . Hyperlipidemia 12/14/2013  . Hypertension 12/14/2013  . Vitamin D deficiency 12/14/2013  . Scoliosis (and kyphoscoliosis), idiopathic 12/14/2013   Outpatient Encounter Medications as of 07/29/2018   Medication Sig  . aspirin EC 81 MG tablet Take 81 mg by mouth daily.  . calcium citrate-vitamin D (CITRACAL+D) 315-200 MG-UNIT per tablet Take 1 tablet by mouth daily.   . cholecalciferol (VITAMIN D) 1000 UNITS tablet Take 3,000 Units by mouth daily.   . hydrochlorothiazide (MICROZIDE) 12.5 MG capsule TAKE (1) CAPSULE DAILY  . Multiple Vitamins-Minerals (MULTIVITAMIN WITH MINERALS) tablet Take 1 tablet by mouth daily.  . Omega-3 Fatty Acids (FISH OIL PO) Take by mouth.  . pravastatin (PRAVACHOL) 10 MG tablet TAKE 1 TABLET DAILY  . [DISCONTINUED] pravastatin (PRAVACHOL) 10 MG tablet TAKE 1 TABLET DAILY   No facility-administered encounter medications on file as of 07/29/2018.       Review of Systems  Constitutional: Negative.   HENT: Negative.   Eyes: Negative.   Respiratory: Negative.   Cardiovascular: Negative.   Gastrointestinal: Negative.   Endocrine: Negative.   Genitourinary: Negative.   Musculoskeletal: Negative.   Skin: Negative.   Allergic/Immunologic: Negative.   Neurological: Negative.   Hematological: Negative.   Psychiatric/Behavioral: Negative.        Objective:   Physical Exam  Constitutional: She is oriented to person, place, and time. She appears well-developed and well-nourished. No distress.  Patient is elderly and kind and relates her history well according to her recent history.  She does have some memory impairment.  HENT:  Head: Normocephalic and atraumatic.  Right Ear: External ear normal.  Left Ear: External ear normal.  Nose: Nose normal.  Mouth/Throat: Oropharynx is clear and moist.  Eyes: Pupils are equal, round, and reactive to light. Conjunctivae and EOM are normal. Right eye exhibits no discharge. Left eye exhibits no discharge. No scleral icterus.  Neck: Normal range of motion. Neck supple. No thyromegaly present.  Cardiovascular: Normal rate and normal heart sounds.  No murmur heard. Heart is irregular today at 84/min.  Distal pulses were  difficult to palpate.  Pulmonary/Chest: Effort normal and breath sounds normal. She has no wheezes. She has no rales. She exhibits no tenderness.  Clear anteriorly and posteriorly contusion of left breast with no chest wall tenderness surprisingly.  Abdominal: Soft. Bowel sounds are normal. She exhibits no mass. There is no tenderness.  Musculoskeletal: Normal range of motion. She exhibits edema and tenderness.  Some tenderness at the lateral joint lines of both knees.  The left knee appears to be slightly more swollen than the right but no redness rubor.  Both knees have been replaced.  Lymphadenopathy:    She has no cervical adenopathy.  Neurological: She is alert and oriented to person, place, and time. She has normal reflexes. No cranial nerve deficit.  Cranial nerves II through XII are intact and reflexes appear to be normal and lower extremities.  Skin: Skin is warm and dry. No rash noted.  Contusion left breast  Psychiatric: She has a normal mood and affect. Her behavior is normal. Judgment and thought content normal.  Mood affect and behavior appeared to be stable for this patient.  Nursing note and vitals reviewed.   BP 116/78 (BP Location: Left Arm)   Pulse (!) 55   Temp 97.7 F (36.5 C) (Oral)   Ht 4' 10"  (1.473 m)   Wt 128 lb (58.1 kg)   BMI 26.75 kg/m   EKG with results pending=== Left knee with results pending===     Assessment & Plan:  1. Essential hypertension -Pressure is good today and she will continue with current treatment - BMP8+EGFR - CBC with Differential/Platelet - Hepatic function panel  2. Pure hypercholesterolemia -Continue with therapeutic lifestyle changes - CBC with Differential/Platelet - Lipid panel  3. Vitamin D deficiency -Continue with vitamin D replacement - DG [facility] DEXA; Future - CBC with Differential/Platelet - VITAMIN D 25 Hydroxy (Vit-D Deficiency, Fractures)  4. Gait instability -Use walker properly and lock in position when  not using -He says she is especially having trouble with her left knee and it seems to give way with her. We will schedule an appointment with orthopedic surgeon that comes to this office to check this to make sure there is nothing else that can be done because of the instability. - CBC with Differential/Platelet  5. Screening for osteoporosis - DG [facility] DEXA; Future - CBC with Differential/Platelet  6. Postmenopausal - DG [facility] DEXA; Future - CBC with Differential/Platelet  7. Irregular heartbeat -EKG  8. Contusion of left breast, initial encounter -Watch and reassure and use ice if sore.  Take Tylenol if needed for pain.  Patient Instructions                       Medicare Annual Wellness Visit  [facility] and the medical providers at [facility] strive to bring you the best medical care.  In doing so we not only want to address your current medical conditions and concerns but also to detect new conditions early  and prevent illness, disease and health-related problems.    Medicare offers a yearly Wellness Visit which allows our clinical staff to assess your need for preventative services including immunizations, lifestyle education, counseling to decrease risk of preventable diseases and screening for fall risk and other medical concerns.    This visit is provided free of charge (no copay) for all Medicare recipients. The clinical pharmacists at [facility] have begun to conduct these Wellness Visits which will also include a thorough review of all your medications.    As you primary medical provider recommend that you make an appointment for your Annual Wellness Visit if you have not done so already this year.  You may set up this appointment before you leave today or you may call back [phone]) and schedule an appointment.  Please make sure when you call that you mention that you are scheduling your Annual Wellness Visit with the clinical  pharmacist so that the appointment may be made for the proper length of time.     Continue current medications. Continue good therapeutic lifestyle changes which include good diet and exercise. Fall precautions discussed with patient. If an FOBT was given today- please return it to our front desk. If you are over 25 years old - you may need Prevnar 45 or the adult Pneumonia vaccine.  **Flu shots are available--- please call and schedule a FLU-CLINIC appointment**  After your visit with [place] today you will receive a survey in the mail or online from [employer] regarding your care with [place]. Please take a moment to fill this out. Your feedback is very important to [place] as you can help [place] better understand your patient needs as well as improve your experience and satisfaction. WE CARE ABOUT YOU!!!  We will arrange for the patient to see the orthopedic surgeon when he is coming to this office at his next visit. It is very important that the patient lock her wheelchair the side of her bed so that when she gets up it is in a locked position and she must try to remember this.  Her husband will have to help her.   Arrie Senate MD

## 2018-07-29 NOTE — Addendum Note (Signed)
Addended by: Magdalene RiverBULLINS, Nickolaus Bordelon H on: 07/29/2018 05:04 PM   Modules accepted: Orders

## 2018-07-29 NOTE — Patient Instructions (Addendum)
Medicare Annual Wellness Visit  [facility] and the medical providers at [facility] strive to bring you the best medical care.  In doing so we not only want to address your current medical conditions and concerns but also to detect new conditions early and prevent illness, disease and health-related problems.    Medicare offers a yearly Wellness Visit which allows our clinical staff to assess your need for preventative services including immunizations, lifestyle education, counseling to decrease risk of preventable diseases and screening for fall risk and other medical concerns.    This visit is provided free of charge (no copay) for all Medicare recipients. The clinical pharmacists at [facility] have begun to conduct these Wellness Visits which will also include a thorough review of all your medications.    As you primary medical provider recommend that you make an appointment for your Annual Wellness Visit if you have not done so already this year.  You may set up this appointment before you leave today or you may call back [phone] and schedule an appointment.  Please make sure when you call that you mention that you are scheduling your Annual Wellness Visit with the clinical pharmacist so that the appointment may be made for the proper length of time.     Continue current medications. Continue good therapeutic lifestyle changes which include good diet and exercise. Fall precautions discussed with patient. If an FOBT was given today- please return it to our front desk. If you are over 82 years old - you may need Prevnar 13 or the adult Pneumonia vaccine.  **Flu shots are available--- please call and schedule a FLU-CLINIC appointment**  After your visit with us today you will receive a survey in the mail or online from [employer] regarding your care with us. Please take a moment to fill this out. Your feedback is very  important to us as you can help us better understand your patient needs as well as improve your experience and satisfaction. WE CARE ABOUT YOU!!!   We will arrange for the patient to see the orthopedic surgeon when he is coming to this office at his next visit. It is very important that the patient lock her wheelchair the side of her bed so that when she gets up it is in a locked position and she must try to remember this.  Her husband will have to help her. We will also arrange a visit with Cardiology

## 2018-07-29 NOTE — Addendum Note (Signed)
Addended by: Magdalene RiverBULLINS, Babe Anthis H on: 07/29/2018 04:54 PM   Modules accepted: Orders

## 2018-07-30 ENCOUNTER — Telehealth: Payer: Self-pay | Admitting: Family Medicine

## 2018-07-30 LAB — CBC WITH DIFFERENTIAL/PLATELET
BASOS: 1 %
Basophils Absolute: 0 10*3/uL (ref 0.0–0.2)
EOS (ABSOLUTE): 0 10*3/uL (ref 0.0–0.4)
EOS: 0 %
HEMATOCRIT: 40.1 % (ref 34.0–46.6)
Hemoglobin: 13.4 g/dL (ref 11.1–15.9)
IMMATURE GRANULOCYTES: 0 %
Immature Grans (Abs): 0 10*3/uL (ref 0.0–0.1)
Lymphocytes Absolute: 1.7 10*3/uL (ref 0.7–3.1)
Lymphs: 31 %
MCH: 31.4 pg (ref 26.6–33.0)
MCHC: 33.4 g/dL (ref 31.5–35.7)
MCV: 94 fL (ref 79–97)
MONOS ABS: 0.5 10*3/uL (ref 0.1–0.9)
Monocytes: 9 %
NEUTROS PCT: 59 %
Neutrophils Absolute: 3.2 10*3/uL (ref 1.4–7.0)
Platelets: 206 10*3/uL (ref 150–450)
RBC: 4.27 x10E6/uL (ref 3.77–5.28)
RDW: 14.2 % (ref 12.3–15.4)
WBC: 5.4 10*3/uL (ref 3.4–10.8)

## 2018-07-30 LAB — BMP8+EGFR
BUN / CREAT RATIO: 15 (ref 12–28)
BUN: 10 mg/dL (ref 8–27)
CO2: 28 mmol/L (ref 20–29)
Calcium: 9.9 mg/dL (ref 8.7–10.3)
Chloride: 100 mmol/L (ref 96–106)
Creatinine, Ser: 0.65 mg/dL (ref 0.57–1.00)
GFR calc Af Amer: 92 mL/min/{1.73_m2} (ref >59)
GFR, EST NON AFRICAN AMERICAN: 80 mL/min/{1.73_m2} (ref >59)
Glucose: 100 mg/dL — ABNORMAL HIGH (ref 65–99)
Potassium: 3.6 mmol/L (ref 3.5–5.2)
SODIUM: 144 mmol/L (ref 134–144)

## 2018-07-30 LAB — HEPATIC FUNCTION PANEL
ALT: 13 IU/L (ref 0–32)
AST: 25 IU/L (ref 0–40)
Albumin: 4.3 g/dL (ref 3.5–4.7)
Alkaline Phosphatase: 61 IU/L (ref 39–117)
Bilirubin Total: 0.6 mg/dL (ref 0.0–1.2)
Bilirubin, Direct: 0.19 mg/dL (ref 0.00–0.40)
Total Protein: 7 g/dL (ref 6.0–8.5)

## 2018-07-30 LAB — LIPID PANEL
CHOL/HDL RATIO: 2.6 ratio (ref 0.0–4.4)
Cholesterol, Total: 206 mg/dL — ABNORMAL HIGH (ref 100–199)
HDL: 78 mg/dL (ref >39)
LDL Calculated: 114 mg/dL — ABNORMAL HIGH (ref 0–99)
Triglycerides: 70 mg/dL (ref 0–149)
VLDL CHOLESTEROL CAL: 14 mg/dL (ref 5–40)

## 2018-07-30 LAB — VITAMIN D 25 HYDROXY (VIT D DEFICIENCY, FRACTURES): VIT D 25 HYDROXY: 48.4 ng/mL (ref 30.0–100.0)

## 2018-07-30 NOTE — Telephone Encounter (Signed)
Melissa Hall is aware that she needs to be added to [place] papers

## 2018-07-30 NOTE — Telephone Encounter (Signed)
The relative that is calling with questions isn't on the ROI. Do you know who this person is and is it ok to talk to them?

## 2018-08-03 ENCOUNTER — Other Ambulatory Visit: Payer: Medicare Other

## 2018-08-03 ENCOUNTER — Telehealth: Payer: Self-pay | Admitting: Family Medicine

## 2018-08-03 DIAGNOSIS — Z1211 Encounter for screening for malignant neoplasm of colon: Secondary | ICD-10-CM | POA: Diagnosis not present

## 2018-08-03 NOTE — Telephone Encounter (Signed)
Pt called checking in referrals - aware that there are no appts made yet.

## 2018-08-05 ENCOUNTER — Other Ambulatory Visit: Payer: Medicare Other

## 2018-08-05 LAB — FECAL OCCULT BLOOD, IMMUNOCHEMICAL: FECAL OCCULT BLD: NEGATIVE

## 2018-08-17 ENCOUNTER — Other Ambulatory Visit: Payer: Self-pay | Admitting: Family Medicine

## 2018-09-10 ENCOUNTER — Other Ambulatory Visit: Payer: Self-pay | Admitting: *Deleted

## 2018-09-10 MED ORDER — DONEPEZIL HCL 5 MG PO TABS: 5.0000 mg | ORAL_TABLET | Freq: Every day | ORAL | 0 refills | Status: DC

## 2018-10-18 ENCOUNTER — Encounter: Payer: Self-pay | Admitting: *Deleted

## 2018-10-18 ENCOUNTER — Ambulatory Visit (INDEPENDENT_AMBULATORY_CARE_PROVIDER_SITE_OTHER): Payer: Medicare Other | Admitting: *Deleted

## 2018-10-18 VITALS — BP 121/82 | HR 82 | Ht <= 58 in | Wt 126.0 lb

## 2018-10-18 DIAGNOSIS — Z23 Encounter for immunization: Secondary | ICD-10-CM | POA: Diagnosis not present

## 2018-10-18 DIAGNOSIS — Z0000 Encounter for general adult medical examination without abnormal findings: Secondary | ICD-10-CM

## 2018-10-18 NOTE — Patient Instructions (Addendum)
  Melissa Hall , Thank you for taking time to come for your Medicare Wellness Visit. I appreciate your ongoing commitment to your health goals. Please review the following plan we discussed and let me know if I can assist you in the future.    These are the goals we discussed: Goals    . Exercise 150 minutes per week (moderate activity)    . Have 3 meals a day       This is a list of the screening recommended for you and due dates:  Health Maintenance  Topic Date Due  . Flu Shot  07/22/2018  . DEXA scan (bone density measurement)  02/12/2019*  . Tetanus Vaccine  02/12/2019*  . Pneumonia vaccines  Completed  *Topic was postponed. The date shown is not the original due date.

## 2018-10-18 NOTE — Progress Notes (Addendum)
Subjective:   Melissa Hall is a 84 y.o. female who presents for a Medicare Annual Wellness Visit. Lives at home with her husband of 66 years. They have two adult children. Their son lives locally and their daughter lives at [address]. They have several grandchildren. She is retired and is mostly sedentary. She does do some cooking.   Review of Systems    Patient reports that her overall health is unchanged compared to last year.  Cardiac Risk Factors include: advanced age (>7men, >94 women);family history of premature cardiovascular disease;sedentary lifestyle;hypertension(previous CVAs)  Neuro: trouble with forming short term memories. Took Aricept for a month but did not see an improvement. Doesn't want to continue taking it.   All other systems negative       Current Medications (verified) Outpatient Encounter Medications as of 10/18/2018  Medication Sig  . aspirin EC 81 MG tablet Take 81 mg by mouth daily.  . calcium citrate-vitamin D (CITRACAL+D) 315-200 MG-UNIT per tablet Take 1 tablet by mouth daily.   . cholecalciferol (VITAMIN D) 1000 UNITS tablet Take 3,000 Units by mouth daily.   Marland Kitchen donepezil (ARICEPT) 5 MG tablet Take 1 tablet (5 mg total) by mouth at bedtime.  . hydrochlorothiazide (MICROZIDE) 12.5 MG capsule TAKE (1) CAPSULE DAILY  . Multiple Vitamins-Minerals (MULTIVITAMIN WITH MINERALS) tablet Take 1 tablet by mouth daily.  . Omega-3 Fatty Acids (FISH OIL PO) Take by mouth.  . pravastatin (PRAVACHOL) 10 MG tablet TAKE 1 TABLET DAILY   No facility-administered encounter medications on file as of 10/18/2018.     Allergies (verified) Keflex [cephalexin]   History: Past Medical History:  Diagnosis Date  . Arthritis   . Cataract   . GERD (gastroesophageal reflux disease)   . High cholesterol   . Hypertension   . Osteoporosis    Past Surgical History:  Procedure Laterality Date  . ABDOMINAL HYSTERECTOMY    . APPENDECTOMY    . BACK SURGERY    .  CATARACT EXTRACTION W/PHACO  10/18/2012   Procedure: CATARACT EXTRACTION PHACO AND INTRAOCULAR LENS PLACEMENT (IOC);  Surgeon: Gemma Payor, MD;  Location: [facility];  Service: Ophthalmology;  Laterality: Left;  CDE:17.69  . COLONOSCOPY    . EYE SURGERY    . JOINT REPLACEMENT     bilat  . ORIF HUMERUS FRACTURE Left 05/12/2014   Procedure: OPEN REDUCTION INTERNAL FIXATION (ORIF) PROXIMAL HUMERUS FRACTURE;  Surgeon: Eldred Manges, MD;  Location: [facility];  Service: Orthopedics;  Laterality: Left;  Open Reduction Internal Fixation Left Proximal Humerus  . TONSILLECTOMY     Family History  Problem Relation Age of Onset  . Heart disease Mother   . Osteoporosis Mother   . Heart disease Father   . Cancer Father   . Stroke Father   . Suicidality Brother   . Suicidality Daughter    Social History   Socioeconomic History  . Marital status: Married    Spouse name: Not on file  . Number of children: Not on file  . Years of education: 2  . Highest education level: Associate degree: occupational, Scientist, product/process development, or vocational program  Occupational History  . Not on file  Social Needs  . Financial resource strain: Not hard at all  . Food insecurity:    Worry: Never true    Inability: Never true  . Transportation needs:    Medical: No    Non-medical: No  Tobacco Use  . Smoking status: Never Smoker  . Smokeless tobacco: Never Used  Substance and Sexual Activity  . Alcohol use: No  . Drug use: No  . Sexual activity: Not Currently  Lifestyle  . Physical activity:    Days per week: 0 days    Minutes per session: 0 min  . Stress: Only a little  Relationships  . Social connections:    Talks on phone: More than three times a week    Gets together: More than three times a week    Attends religious service: More than 4 times per year    Active member of club or organization: Yes    Attends meetings of clubs or organizations: More than 4 times per year    Relationship status: Not on file  Other  Topics Concern  . Not on file  Social History Narrative   Lives at home with her husband of 66 years. They have two adult children. Their son lives locally and their daughter lives at [address]. They have several grandchildren. She is retired and is mostly sedentary. She does do some cooking.     Tobacco Use No.  Clinical Intake:  Pre-visit preparation completed: No  Pain : No/denies pain     Nutritional Status: BMI 25 -29 Overweight Diabetes: No  How often do you need to have someone help you when you read instructions, pamphlets, or other written materials from your doctor or pharmacy?: 2 - Rarely What is the last grade level you completed in school?: Business college at [employer] Needed?: No  Information entered by :: [name], RN   Activities of Daily Living In your present state of health, do you have any difficulty performing the following activities: 10/18/2018  Hearing? Y  Comment slight difficuty with hearing but is not bothered by it  Vision? Y  Comment double vision but corrected with glassess. Sees eye doctor regularly.   Difficulty concentrating or making decisions? Y  Comment diagnosed with dementia. Tried one month of medication but did not want to continue. Did not see any benefit  Walking or climbing stairs? Y  Comment using rolling walker with seat  Dressing or bathing? N  Doing errands, shopping? N  Preparing Food and eating ? N  Using the Toilet? N  In the past six months, have you accidently leaked urine? N  Do you have problems with loss of bowel control? N  Managing your Medications? N  Managing your Finances? N  Housekeeping or managing your Housekeeping? N  Comment help from husband  Some recent data might be hidden     Diet Decreased appetite. Drinks an Ensure most days.  Breakfast-toast with honey or jelly and coffee or juice Usually skips lunch but may have a snack like nabs Supper-usually has  home cooked supper. Doesn't eat very much though Drinks water   Exercise Current Exercise Habits: The patient does not participate in regular exercise at present, Exercise limited by: neurologic condition(s);orthopedic condition(s)   Depression Screen PHQ 2/9 Scores 10/18/2018 07/29/2018 02/12/2018 10/13/2017 09/29/2017 04/30/2017 11/25/2016  PHQ - 2 Score 0 0 0 0 0 0 0     Fall Risk Fall Risk  10/18/2018 07/29/2018 02/12/2018 10/13/2017 09/29/2017  Falls in the past year? No Yes Yes No No  Number falls in past yr: - 2 or more 2 or more - -  Comment - - - - -  Injury with Fall? - Yes No - -  Risk Factor Category  - - - - -  Risk for fall due  to : - - - History of fall(s) -    Safety Is the patient's home free of loose throw rugs in walkways, pet beds, electrical cords, etc?   yes      Grab bars in the bathroom? no      Walkin shower? no      Shower Seat? no      Handrails on the stairs?   yes      Adequate lighting?   yes  Patient Care Team: Ernestina Penna, MD as PCP - General (Family Medicine) Michaelle Copas, MD as Referring Physician (Optometry) Marjory Lies, Glenford Bayley, MD as Consulting Physician (Neurology)  Hospitalizations, surgeries, and ER visits in previous 12 months No hospitalizations, ER visits, or surgeries this past year.   Objective:    Today's Vitals   10/18/18 1047  BP: 121/82  Pulse: 82  Weight: 126 lb (57.2 kg)  Height: 4\' 10"  (1.473 m)   Body mass index is 26.33 kg/m.  Advanced Directives 10/18/2018 10/13/2017 07/10/2015 07/05/2015 05/12/2014 10/18/2012 10/13/2012  Does Patient Have a Medical Advance Directive? No Yes - Yes Patient does not have advance directive;Patient would not like information Patient does not have advance directive;Patient would not like information Patient does not have advance directive;Patient would like information  Type of Advance Directive - Healthcare Power of [place];Living will - Healthcare Power of [place];Living will - - -    Does patient want to make changes to medical advance directive? - No - Patient declined Yes - information given No - Patient declined - - -  Copy of Healthcare Power of Attorney in Chart? - No - copy requested - No - copy requested - - -  Would patient like information on creating a medical advance directive? No - Patient declined - - - - - Advance directive packet given    Hearing/Vision  No hearing or vision deficits noted during visit.  Cognitive Function: MMSE - Mini Mental State Exam 10/13/2017 07/05/2015  Orientation to time 3 4  Orientation to Place 5 5  Registration 3 3  Attention/ Calculation 4 4  Recall 0 3  Language- name 2 objects 2 2  Language- repeat 1 1  Language- follow 3 step command 2 2  Language- read & follow direction 1 1  Write a sentence 1 1  Copy design 0 0  Total score 22 26     6CIT Screen 10/18/2018  What Year? 4 points  What month? 3 points  What time? 0 points  Count back from 20 0 points  Months in reverse 4 points  Repeat phrase 10 points  Total Score 21   Normal Cognitive Function Screening: No: diagnosed dementia    Immunizations and Health Maintenance Immunization History  Administered Date(s) Administered  . Influenza Split 09/20/2013  . Influenza, High Dose Seasonal PF 10/18/2018  . Influenza,inj,Quad PF,6+ Mos 09/22/2017  . Influenza-Unspecified 10/10/2014, 10/01/2015, 10/01/2016  . Pneumococcal Conjugate-13 12/14/2013  . Pneumococcal Polysaccharide-23 08/10/2012   There are no preventive care reminders to display for this patient. Health Maintenance  Topic Date Due  . DEXA SCAN  02/12/2019 (Originally 01/31/2018)  . TETANUS/TDAP  02/12/2019 (Originally 09/21/2016)  . INFLUENZA VACCINE  Completed  . PNA vac Low Risk Adult  Completed        Assessment:   This is a routine wellness examination for [facility].    Plan:    Goals    . Exercise 150 minutes per week (moderate activity)    . Have  3 meals a day        Health  Maintenance Recommendations: Influenza vaccine Td vaccine   Additional Screening Recommendations: Lung: Low Dose CT Chest recommended if Age 41-80 years, 30 pack-year currently smoking OR have quit w/in 15years. Patient does not qualify. Hepatitis C Screening recommended: no  Today's Orders Orders Placed This Encounter  Procedures  . Flu vaccine HIGH DOSE PF  given today  Keep f/u with Ernestina Penna, MD and any other specialty appointments you may have Continue current medications Move carefully to avoid falls. Use assistive devices like a cane or walker if needed. Aim for at least 150 minutes of moderate activity a week. This can be done with chair exercises if necessary. Read or work on puzzles daily Stay connected with friends and family  I have personally reviewed and noted the following in the patient's chart:   . Medical and social history . Use of alcohol, tobacco or illicit drugs  . Current medications and supplements . Functional ability and status . Nutritional status . Physical activity . Advanced directives . List of other physicians . Hospitalizations, surgeries, and ER visits in previous 12 months . Vitals . Screenings to include cognitive, depression, and falls . Referrals and appointments  In addition, I have reviewed and discussed with patient certain preventive protocols, quality metrics, and best practice recommendations. A written personalized care plan for preventive services as well as general preventive health recommendations were provided to patient.     [name], RN   10/18/2018   I have reviewed and agree with the above AWV documentation.   Sylwia-Margaret Daphine Deutscher, FNP

## 2018-10-19 NOTE — Progress Notes (Signed)
Cardiology Office Note   Date:  10/20/2018   ID:  Chanele, Douglas Jul 20, 1928, MRN [id number]  PCP:  Ernestina Penna, MD  Cardiologist:   No primary care provider on file. Referring:  Ernestina Penna, MD  Chief Complaint  Patient presents with  . Palpitations      History of Present Illness: Melissa Hall is a 84 y.o. female who presents for evaluation of palpitations.  She was noted to have ectopy on an EKG earlier this year.  These were PACs.  However, she says she does not feel anything. The patient denies any new symptoms such as chest discomfort, neck or arm discomfort. There has been no new shortness of breath, PND or orthopnea. There have been no reported palpitations, presyncope or syncope.  She gets around with a walker for the most part.  She has had some falls.  I reviewed these with her.  None of them seem to be a loss of consciousness.  She is having clear mechanical fall on a few occasions.  She has not had any prior cardiac work-up.  She has no history of cardiovascular disease.      Past Medical History:  Diagnosis Date  . Arthritis   . Cataract   . GERD (gastroesophageal reflux disease)   . High cholesterol   . Hypertension   . Osteoporosis     Past Surgical History:  Procedure Laterality Date  . ABDOMINAL HYSTERECTOMY    . APPENDECTOMY    . BACK SURGERY    . CATARACT EXTRACTION W/PHACO  10/18/2012   Procedure: CATARACT EXTRACTION PHACO AND INTRAOCULAR LENS PLACEMENT (IOC);  Surgeon: Gemma Payor, MD;  Location: [facility];  Service: Ophthalmology;  Laterality: Left;  CDE:17.69  . COLONOSCOPY    . EYE SURGERY    . JOINT REPLACEMENT     bilat  . ORIF HUMERUS FRACTURE Left 05/12/2014   Procedure: OPEN REDUCTION INTERNAL FIXATION (ORIF) PROXIMAL HUMERUS FRACTURE;  Surgeon: Eldred Manges, MD;  Location: [facility];  Service: Orthopedics;  Laterality: Left;  Open Reduction Internal Fixation Left Proximal Humerus  . TONSILLECTOMY       Current Outpatient  Medications  Medication Sig Dispense Refill  . aspirin EC 81 MG tablet Take 81 mg by mouth daily.    . calcium citrate-vitamin D (CITRACAL+D) 315-200 MG-UNIT per tablet Take 1 tablet by mouth daily.     . cholecalciferol (VITAMIN D) 1000 UNITS tablet Take 2,000 Units by mouth daily.     . hydrochlorothiazide (MICROZIDE) 12.5 MG capsule TAKE (1) CAPSULE DAILY 30 capsule 5  . Multiple Vitamins-Minerals (MULTIVITAMIN WITH MINERALS) tablet Take 1 tablet by mouth daily.    . Omega-3 Fatty Acids (FISH OIL PO) Take by mouth.    . pravastatin (PRAVACHOL) 10 MG tablet TAKE 1 TABLET DAILY 90 tablet 1  . donepezil (ARICEPT) 5 MG tablet Take 1 tablet (5 mg total) by mouth at bedtime. 30 tablet 0   No current facility-administered medications for this visit.     Allergies:   Keflex [cephalexin]    Social History:  The patient  reports that she has never smoked. She has never used smokeless tobacco. She reports that she does not drink alcohol or use drugs.   Family History:  The patient's family history includes Cancer in her father; Heart disease in her father and mother; Osteoporosis in her mother; Stroke in her father; Suicidality in her brother and daughter.    ROS:  Please see  the history of present illness.   Otherwise, review of systems are positive for none.   All other systems are reviewed and negative.    PHYSICAL EXAM: VS:  BP 130/90   Pulse (!) 56   Ht 4\' 10"  (1.473 m)   Wt 127 lb (57.6 kg)   BMI 26.54 kg/m  , BMI Body mass index is 26.54 kg/m. GENERAL:  Well appearing HEENT:  Pupils equal round and reactive, fundi not visualized, oral mucosa unremarkable NECK:  No jugular venous distention, waveform within normal limits, carotid upstroke brisk and symmetric, no bruits, no thyromegaly LYMPHATICS:  No cervical, inguinal adenopathy LUNGS:  Clear to auscultation bilaterally BACK:  No CVA tenderness CHEST:  Unremarkable HEART:  PMI not displaced or sustained,S1 and S2 within normal  limits, no S3, no S4, no clicks, no rubs, no murmurs ABD:  Flat, positive bowel sounds normal in frequency in pitch, no bruits, no rebound, no guarding, no midline pulsatile mass, no hepatomegaly, no splenomegaly EXT:  2 plus pulses throughout, no edema, no cyanosis no clubbing SKIN:  No rashes no nodules NEURO:  Cranial nerves II through XII grossly intact, motor grossly intact throughout PSYCH:  Cognitively intact, oriented to person place and time    EKG:  EKG is not ordered today. The ekg ordered 07/29/18 demonstrates sinus rhythm, rate 73, axis within normal limits, intervals within normal limits, no acute ST-T wave changes.   Recent Labs: 07/29/2018: ALT 13; BUN 10; Creatinine, Ser 0.65; Hemoglobin 13.4; Platelets 206; Potassium 3.6; Sodium 144    Lipid Panel    Component Value Date/Time   CHOL 206 (H) 07/29/2018 1712   CHOL 154 05/25/2013 1354   TRIG 70 07/29/2018 1712   TRIG 76 12/14/2013 0858   TRIG 69 05/25/2013 1354   HDL 78 07/29/2018 1712   HDL 59 12/14/2013 0858   HDL 56 05/25/2013 1354   CHOLHDL 2.6 07/29/2018 1712   LDLCALC 114 (H) 07/29/2018 1712   LDLCALC 80 12/14/2013 0858   LDLCALC 84 05/25/2013 1354      Wt Readings from Last 3 Encounters:  10/20/18 127 lb (57.6 kg)  10/18/18 126 lb (57.2 kg)  07/29/18 128 lb (58.1 kg)      Other studies Reviewed: Additional studies/ records that were reviewed today include: Office records. Review of the above records demonstrates:  Please see elsewhere in the note.     ASSESSMENT AND PLAN:  ECTOPY: The patient has some ectopy noted on EKG but has no significant symptoms.  I do not think she is having any syncope.  I would not suggest further work-up.      HTN: Blood pressure is well controlled.  No change in therapy.     Current medicines are reviewed at length with the patient today.  The patient does not have concerns regarding medicines.  The following changes have been made:  no change  Labs/ tests  ordered today include: None No orders of the defined types were placed in this encounter.    Disposition:   FU with me as needed.     Signed, Rollene Rotunda, MD  10/20/2018 1:41 PM    [facility]

## 2018-10-20 ENCOUNTER — Ambulatory Visit (INDEPENDENT_AMBULATORY_CARE_PROVIDER_SITE_OTHER): Payer: Medicare Other | Admitting: Cardiology

## 2018-10-20 ENCOUNTER — Encounter: Payer: Self-pay | Admitting: Cardiology

## 2018-10-20 VITALS — BP 130/90 | HR 56 | Ht <= 58 in | Wt 127.0 lb

## 2018-10-20 DIAGNOSIS — I491 Atrial premature depolarization: Secondary | ICD-10-CM | POA: Diagnosis not present

## 2018-10-20 DIAGNOSIS — I10 Essential (primary) hypertension: Secondary | ICD-10-CM | POA: Diagnosis not present

## 2018-10-20 NOTE — Patient Instructions (Signed)
Medication Instructions:  The current medical regimen is effective;  continue present plan and medications.  Follow-Up: Follow up as needed.  Thank you for choosing [facility]!!     

## 2018-12-01 ENCOUNTER — Ambulatory Visit: Payer: Medicare Other | Admitting: Family Medicine

## 2018-12-29 DIAGNOSIS — H40033 Anatomical narrow angle, bilateral: Secondary | ICD-10-CM | POA: Diagnosis not present

## 2018-12-29 DIAGNOSIS — H2513 Age-related nuclear cataract, bilateral: Secondary | ICD-10-CM | POA: Diagnosis not present

## 2019-01-04 ENCOUNTER — Ambulatory Visit (INDEPENDENT_AMBULATORY_CARE_PROVIDER_SITE_OTHER): Payer: Medicare Other

## 2019-01-04 ENCOUNTER — Ambulatory Visit (INDEPENDENT_AMBULATORY_CARE_PROVIDER_SITE_OTHER): Payer: Medicare Other | Admitting: Family Medicine

## 2019-01-04 ENCOUNTER — Encounter: Payer: Self-pay | Admitting: Family Medicine

## 2019-01-04 VITALS — BP 107/69 | HR 80 | Temp 97.8°F | Ht <= 58 in | Wt 129.0 lb

## 2019-01-04 DIAGNOSIS — E559 Vitamin D deficiency, unspecified: Secondary | ICD-10-CM

## 2019-01-04 DIAGNOSIS — Z780 Asymptomatic menopausal state: Secondary | ICD-10-CM

## 2019-01-04 DIAGNOSIS — E7800 Pure hypercholesterolemia, unspecified: Secondary | ICD-10-CM | POA: Diagnosis not present

## 2019-01-04 DIAGNOSIS — F0150 Vascular dementia without behavioral disturbance: Secondary | ICD-10-CM

## 2019-01-04 DIAGNOSIS — I499 Cardiac arrhythmia, unspecified: Secondary | ICD-10-CM

## 2019-01-04 DIAGNOSIS — R2681 Unsteadiness on feet: Secondary | ICD-10-CM

## 2019-01-04 DIAGNOSIS — I10 Essential (primary) hypertension: Secondary | ICD-10-CM | POA: Diagnosis not present

## 2019-01-04 DIAGNOSIS — Z13820 Encounter for screening for osteoporosis: Secondary | ICD-10-CM | POA: Diagnosis not present

## 2019-01-04 DIAGNOSIS — G8929 Other chronic pain: Secondary | ICD-10-CM

## 2019-01-04 DIAGNOSIS — M25562 Pain in left knee: Secondary | ICD-10-CM

## 2019-01-04 MED ORDER — DONEPEZIL HCL 10 MG PO TABS: 10.0000 mg | ORAL_TABLET | Freq: Every day | ORAL | 1 refills | Status: DC

## 2019-01-04 MED ORDER — DONEPEZIL HCL 5 MG PO TABS: 5.0000 mg | ORAL_TABLET | Freq: Every day | ORAL | 0 refills | Status: DC

## 2019-01-04 NOTE — Patient Instructions (Addendum)
Medicare Annual Wellness Visit  [place] and the medical providers at [facility] strive to bring you the best medical care.  In doing so we not only want to address your current medical conditions and concerns but also to detect new conditions early and prevent illness, disease and health-related problems.    Medicare offers a yearly Wellness Visit which allows our clinical staff to assess your need for preventative services including immunizations, lifestyle education, counseling to decrease risk of preventable diseases and screening for fall risk and other medical concerns.    This visit is provided free of charge (no copay) for all Medicare recipients. The clinical pharmacists at [facility] have begun to conduct these Wellness Visits which will also include a thorough review of all your medications.    As you primary medical provider recommend that you make an appointment for your Annual Wellness Visit if you have not done so already this year.  You may set up this appointment before you leave today or you may call back ([phone]) and schedule an appointment.  Please make sure when you call that you mention that you are scheduling your Annual Wellness Visit with the clinical pharmacist so that the appointment may be made for the proper length of time.    Continue current medications. Continue good therapeutic lifestyle changes which include good diet and exercise. Fall precautions discussed with patient. If an FOBT was given today- please return it to our front desk. If you are over 54 years old - you may need Prevnar 13 or the adult Pneumonia vaccine.  **Flu shots are available--- please call and schedule a FLU-CLINIC appointment**  After your visit with [place] today you will receive a survey in the mail or online from [employer] regarding your care with [place]. Please take a moment to fill this out. Your feedback is very  important to [place] as you can help [place] better understand your patient needs as well as improve your experience and satisfaction. WE CARE ABOUT YOU!!!   Patient should continue to be careful and not put yourself at risk for falling She should use her walker regularly She should take her calcium and vitamin D regularly She should start back on Aricept 5 mg and take this for a month and then go to 10 mg after that.  After she has been on the 10 mg for a month we would like to add Namenda Continue to drink plenty of water and fluids and stay well-hydrated

## 2019-01-04 NOTE — Progress Notes (Signed)
Subjective:    Patient ID: Melissa Hall, female    DOB: 05-19-1928, 85 y.o.   MRN: [id number]  HPI  Pt here for follow up and management of chronic medical problems which includes hypertension and hyperlipidemia. She is taking medication regularly.  Patient comes in for her regular visit and has no problems to complain with today.  She is requesting refills on her Aricept.  She was on 5 mg and did well but did not refill it.  She is due to get a DEXA scan she will get a chest x-ray today along with lab work.  We will plan to add Namenda to her Aricept once she has been on the 10 mg for a while.  Comes to the visit today with her husband.  She does not acknowledge that she has memory issues but she clearly does.  She is using her rolling walker.  She has severe kyphoscoliosis and is at risk for falling.  She has not fallen since the last visit and has tried to make an effort to use the walker more regularly.  Her husband says if she looks up she has a tendency to fall.  She denies any chest pain shortness of breath trouble swallowing nausea vomiting blood in the stool or black tarry bowel movements.  She is passing her water well.  Husband says that she does not eat a lot but her weight is stable and slightly increased since the last visit.  She does not seem to be agitated that we are telling her that she has a memory issue.  She is very pleasant and cooperative and says she will try to do better with taking her medicine.  She has been off the Aricept for 2 to 3 months and we will start back on the 5 mg and then after 1 month of being on this we will automatically go up to the 10 mg and then will consider adding Namenda to that treatment plan.   Patient Active Problem List   Diagnosis Date Noted  . Fracture of proximal end of left humerus 05/13/2014  . Traumatic closed displaced fracture of proximal end of left humerus 05/12/2014    Class: Acute  . Proximal humerus fracture 05/12/2014  .  Osteoporosis, post-menopausal 01/25/2014  . Hyperlipidemia 12/14/2013  . Hypertension 12/14/2013  . Vitamin D deficiency 12/14/2013  . Scoliosis (and kyphoscoliosis), idiopathic 12/14/2013   Outpatient Encounter Medications as of 01/04/2019  Medication Sig  . aspirin EC 81 MG tablet Take 81 mg by mouth daily.  . calcium citrate-vitamin D (CITRACAL+D) 315-200 MG-UNIT per tablet Take 1 tablet by mouth daily.   . cholecalciferol (VITAMIN D) 1000 UNITS tablet Take 2,000 Units by mouth daily.   . hydrochlorothiazide (MICROZIDE) 12.5 MG capsule TAKE (1) CAPSULE DAILY  . Multiple Vitamins-Minerals (MULTIVITAMIN WITH MINERALS) tablet Take 1 tablet by mouth daily.  . Omega-3 Fatty Acids (FISH OIL PO) Take by mouth.  . pravastatin (PRAVACHOL) 10 MG tablet TAKE 1 TABLET DAILY  . [DISCONTINUED] donepezil (ARICEPT) 5 MG tablet Take 1 tablet (5 mg total) by mouth at bedtime.   No facility-administered encounter medications on file as of 01/04/2019.       Review of Systems  Constitutional: Negative.   HENT: Negative.   Eyes: Negative.   Respiratory: Negative.   Cardiovascular: Negative.   Gastrointestinal: Negative.   Endocrine: Negative.   Genitourinary: Negative.   Musculoskeletal: Negative.   Skin: Negative.   Allergic/Immunologic: Negative.   Neurological: Negative.  Hematological: Negative.   Psychiatric/Behavioral: Negative.        Objective:   Physical Exam Vitals signs and nursing note reviewed.  Constitutional:      Appearance: She is well-developed. She is not ill-appearing.     Comments: Elderly and small framed and using a walker but pleasant and cooperative  HENT:     Head: Normocephalic and atraumatic.     Right Ear: Tympanic membrane, ear canal and external ear normal. There is no impacted cerumen.     Left Ear: Tympanic membrane, ear canal and external ear normal. There is no impacted cerumen.     Nose: Nose normal. No congestion.     Mouth/Throat:     Mouth: Mucous  membranes are moist.     Pharynx: Oropharynx is clear. No oropharyngeal exudate or posterior oropharyngeal erythema.  Eyes:     General: No scleral icterus.       Right eye: No discharge.        Left eye: No discharge.     Conjunctiva/sclera: Conjunctivae normal.     Pupils: Pupils are equal, round, and reactive to light.  Neck:     Musculoskeletal: Normal range of motion and neck supple.     Thyroid: No thyromegaly.     Vascular: No carotid bruit or JVD.  Cardiovascular:     Rate and Rhythm: Normal rate. Rhythm irregular.     Heart sounds: Normal heart sounds. No murmur. No gallop.      Comments: Irregular irregular at about 84/min.  No edema. Pulmonary:     Effort: Pulmonary effort is normal. No respiratory distress.     Breath sounds: Normal breath sounds. No wheezing or rales.     Comments: Clear anteriorly and posteriorly Abdominal:     General: Abdomen is flat. Bowel sounds are normal.     Palpations: Abdomen is soft. There is no mass.     Tenderness: There is no abdominal tenderness. There is no guarding or rebound.     Comments: No masses tenderness organ enlargement or bruits  Musculoskeletal: Normal range of motion.        General: Deformity present. No tenderness.     Right lower leg: No edema.     Left lower leg: No edema.     Comments: Patient has kyphoscoliosis  Lymphadenopathy:     Cervical: No cervical adenopathy.  Skin:    General: Skin is warm and dry.     Findings: No rash.  Neurological:     Mental Status: She is alert and oriented to person, place, and time. Mental status is at baseline.     Cranial Nerves: No cranial nerve deficit.     Deep Tendon Reflexes: Reflexes are normal and symmetric.     Comments: Patient has memory disturbance but on the surface responded appropriately to questions asked of her today and seem to be a little bit irritated that her husband had to be with her in the room today.  Psychiatric:        Mood and Affect: Mood normal.          Behavior: Behavior normal.        Thought Content: Thought content normal.     Comments: Mood and affect appear good there is obviously some dementia that is not apparent in a severe way today.    BP 107/69 (BP Location: Left Arm)   Pulse 80   Temp 97.8 F (36.6 C) (Oral)   Ht 4' 10"  (1.473  m)   Wt 129 lb (58.5 kg)   BMI 26.96 kg/m         Assessment & Plan:  1. Essential hypertension -Blood pressure is good today and she will continue with current treatment - CBC with Differential/Platelet - BMP8+EGFR - Hepatic function panel - DG Chest 2 View; Future  2. Pure hypercholesterolemia -Continue with pravastatin and with as aggressive therapeutic lifestyle changes as possible for her age and physical condition - CBC with Differential/Platelet - Lipid panel - DG Chest 2 View; Future  3. Vitamin D deficiency -Continue with calcium and vitamin D pending results of lab work - CBC with Differential/Platelet - VITAMIN D 25 Hydroxy (Vit-D Deficiency, Fractures) - DG [facility] DEXA; Future  4. Screening for osteoporosis -We will delayed this exam - DG [facility] DEXA; Future  5. Postmenopausal -We will delay this exam - DG [facility] DEXA; Future  6. Chronic pain of left knee -Follow-up with orthopedics as needed  7. Irregular heartbeat -This is an ongoing problem.  Patient did see the cardiologist and she had PACs on that visit and this was in October of this year.  He did not feel that any further work-up was necessary.  8. Gait instability -Continue with walker and supervision as much as possible  9. Vascular dementia without behavioral disturbance (HCC) -Start Aricept and take 5 mg for 1 month and then increase to 10 mg.  After couple months on Aricept add Namenda  Meds ordered this encounter  Medications  . DISCONTD: donepezil (ARICEPT) 10 MG tablet    Sig: Take 1 tablet (10 mg total) by mouth at bedtime.    Dispense:  90 tablet    Refill:  1  . donepezil (ARICEPT) 5 MG  tablet    Sig: Take 1 tablet (5 mg total) by mouth at bedtime.    Dispense:  30 tablet    Refill:  0    Start with 1 month of 5 mg and the fill the 10 mg   Patient Instructions                       Medicare Annual Wellness Visit  [facility] and the medical providers at [facility] strive to bring you the best medical care.  In doing so we not only want to address your current medical conditions and concerns but also to detect new conditions early and prevent illness, disease and health-related problems.    Medicare offers a yearly Wellness Visit which allows our clinical staff to assess your need for preventative services including immunizations, lifestyle education, counseling to decrease risk of preventable diseases and screening for fall risk and other medical concerns.    This visit is provided free of charge (no copay) for all Medicare recipients. The clinical pharmacists at [facility] have begun to conduct these Wellness Visits which will also include a thorough review of all your medications.    As you primary medical provider recommend that you make an appointment for your Annual Wellness Visit if you have not done so already this year.  You may set up this appointment before you leave today or you may call [phone] ([phone]) and schedule an appointment.  Please make sure when you call that you mention that you are scheduling your Annual Wellness Visit with the clinical pharmacist so that the appointment may be made for the proper length of time.    Continue current medications. Continue good therapeutic lifestyle changes  which include good diet and exercise. Fall precautions discussed with patient. If an FOBT was given today- please return it to our front desk. If you are over 53 years old - you may need Prevnar 76 or the adult Pneumonia vaccine.  **Flu shots are available--- please call and schedule a FLU-CLINIC appointment**  After  your visit with [place] today you will receive a survey in the mail or online from [employer] regarding your care with [place]. Please take a moment to fill this out. Your feedback is very important to [place] as you can help [place] better understand your patient needs as well as improve your experience and satisfaction. WE CARE ABOUT YOU!!!   Patient should continue to be careful and not put yourself at risk for falling She should use her walker regularly She should take her calcium and vitamin D regularly She should start back on Aricept 5 mg and take this for a month and then go to 10 mg after that.  After she has been on the 10 mg for a month we would like to add Namenda Continue to drink plenty of water and fluids and stay well-hydrated  Arrie Senate MD

## 2019-01-05 LAB — BMP8+EGFR
BUN/Creatinine Ratio: 19 (ref 12–28)
BUN: 12 mg/dL (ref 8–27)
CO2: 27 mmol/L (ref 20–29)
Calcium: 9.9 mg/dL (ref 8.7–10.3)
Chloride: 101 mmol/L (ref 96–106)
Creatinine, Ser: 0.64 mg/dL (ref 0.57–1.00)
GFR calc Af Amer: 92 mL/min/{1.73_m2} (ref >59)
GFR calc non Af Amer: 80 mL/min/{1.73_m2} (ref >59)
Glucose: 79 mg/dL (ref 65–99)
Potassium: 3.6 mmol/L (ref 3.5–5.2)
Sodium: 143 mmol/L (ref 134–144)

## 2019-01-05 LAB — LIPID PANEL
Chol/HDL Ratio: 2.6 ratio (ref 0.0–4.4)
Cholesterol, Total: 207 mg/dL — ABNORMAL HIGH (ref 100–199)
HDL: 79 mg/dL (ref >39)
LDL Calculated: 112 mg/dL — ABNORMAL HIGH (ref 0–99)
Triglycerides: 80 mg/dL (ref 0–149)
VLDL Cholesterol Cal: 16 mg/dL (ref 5–40)

## 2019-01-05 LAB — CBC WITH DIFFERENTIAL/PLATELET
Basophils Absolute: 0 10*3/uL (ref 0.0–0.2)
Basos: 1 %
EOS (ABSOLUTE): 0 10*3/uL (ref 0.0–0.4)
Eos: 1 %
Hematocrit: 39.5 % (ref 34.0–46.6)
Hemoglobin: 13.7 g/dL (ref 11.1–15.9)
IMMATURE GRANULOCYTES: 0 %
Immature Grans (Abs): 0 10*3/uL (ref 0.0–0.1)
Lymphocytes Absolute: 1.6 10*3/uL (ref 0.7–3.1)
Lymphs: 27 %
MCH: 31.9 pg (ref 26.6–33.0)
MCHC: 34.7 g/dL (ref 31.5–35.7)
MCV: 92 fL (ref 79–97)
MONOS ABS: 0.6 10*3/uL (ref 0.1–0.9)
Monocytes: 11 %
NEUTROS PCT: 60 %
Neutrophils Absolute: 3.6 10*3/uL (ref 1.4–7.0)
Platelets: 181 10*3/uL (ref 150–450)
RBC: 4.29 x10E6/uL (ref 3.77–5.28)
RDW: 12 % (ref 11.7–15.4)
WBC: 5.9 10*3/uL (ref 3.4–10.8)

## 2019-01-05 LAB — VITAMIN D 25 HYDROXY (VIT D DEFICIENCY, FRACTURES): VIT D 25 HYDROXY: 38.9 ng/mL (ref 30.0–100.0)

## 2019-01-05 LAB — HEPATIC FUNCTION PANEL
ALT: 17 IU/L (ref 0–32)
AST: 29 IU/L (ref 0–40)
Albumin: 4.3 g/dL (ref 3.5–4.7)
Alkaline Phosphatase: 61 IU/L (ref 39–117)
Bilirubin Total: 0.5 mg/dL (ref 0.0–1.2)
Bilirubin, Direct: 0.18 mg/dL (ref 0.00–0.40)
Total Protein: 6.6 g/dL (ref 6.0–8.5)

## 2019-02-16 ENCOUNTER — Other Ambulatory Visit: Payer: Self-pay | Admitting: Family Medicine

## 2019-04-21 ENCOUNTER — Other Ambulatory Visit: Payer: Self-pay | Admitting: *Deleted

## 2019-04-21 DIAGNOSIS — E559 Vitamin D deficiency, unspecified: Secondary | ICD-10-CM

## 2019-04-21 DIAGNOSIS — I10 Essential (primary) hypertension: Secondary | ICD-10-CM

## 2019-04-21 DIAGNOSIS — E7800 Pure hypercholesterolemia, unspecified: Secondary | ICD-10-CM

## 2019-05-04 ENCOUNTER — Other Ambulatory Visit: Payer: Medicare Other

## 2019-05-04 ENCOUNTER — Other Ambulatory Visit: Payer: Self-pay

## 2019-05-04 DIAGNOSIS — E7800 Pure hypercholesterolemia, unspecified: Secondary | ICD-10-CM

## 2019-05-04 DIAGNOSIS — I10 Essential (primary) hypertension: Secondary | ICD-10-CM | POA: Diagnosis not present

## 2019-05-04 DIAGNOSIS — E559 Vitamin D deficiency, unspecified: Secondary | ICD-10-CM | POA: Diagnosis not present

## 2019-05-05 ENCOUNTER — Other Ambulatory Visit: Payer: Self-pay | Admitting: *Deleted

## 2019-05-05 LAB — CBC WITH DIFFERENTIAL/PLATELET
Basophils Absolute: 0 10*3/uL (ref 0.0–0.2)
Basos: 1 %
EOS (ABSOLUTE): 0.1 10*3/uL (ref 0.0–0.4)
Eos: 1 %
Hematocrit: 37.6 % (ref 34.0–46.6)
Hemoglobin: 13.3 g/dL (ref 11.1–15.9)
Immature Grans (Abs): 0 10*3/uL (ref 0.0–0.1)
Immature Granulocytes: 0 %
Lymphocytes Absolute: 1.3 10*3/uL (ref 0.7–3.1)
Lymphs: 30 %
MCH: 32.8 pg (ref 26.6–33.0)
MCHC: 35.4 g/dL (ref 31.5–35.7)
MCV: 93 fL (ref 79–97)
Monocytes Absolute: 0.5 10*3/uL (ref 0.1–0.9)
Monocytes: 11 %
Neutrophils Absolute: 2.6 10*3/uL (ref 1.4–7.0)
Neutrophils: 57 %
Platelets: 187 10*3/uL (ref 150–450)
RBC: 4.06 x10E6/uL (ref 3.77–5.28)
RDW: 11.7 % (ref 11.7–15.4)
WBC: 4.6 10*3/uL (ref 3.4–10.8)

## 2019-05-05 LAB — LIPID PANEL
Chol/HDL Ratio: 2.4 ratio (ref 0.0–4.4)
Cholesterol, Total: 172 mg/dL (ref 100–199)
HDL: 71 mg/dL (ref >39)
LDL Calculated: 88 mg/dL (ref 0–99)
Triglycerides: 65 mg/dL (ref 0–149)
VLDL Cholesterol Cal: 13 mg/dL (ref 5–40)

## 2019-05-05 LAB — HEPATIC FUNCTION PANEL
ALT: 16 IU/L (ref 0–32)
AST: 28 IU/L (ref 0–40)
Albumin: 4 g/dL (ref 3.6–4.6)
Alkaline Phosphatase: 63 IU/L (ref 39–117)
Bilirubin Total: 0.6 mg/dL (ref 0.0–1.2)
Bilirubin, Direct: 0.23 mg/dL (ref 0.00–0.40)
Total Protein: 6.2 g/dL (ref 6.0–8.5)

## 2019-05-05 LAB — BMP8+EGFR
BUN/Creatinine Ratio: 21 (ref 12–28)
BUN: 13 mg/dL (ref 8–27)
CO2: 27 mmol/L (ref 20–29)
Calcium: 9.7 mg/dL (ref 8.7–10.3)
Chloride: 102 mmol/L (ref 96–106)
Creatinine, Ser: 0.61 mg/dL (ref 0.57–1.00)
GFR calc Af Amer: 94 mL/min/{1.73_m2} (ref >59)
GFR calc non Af Amer: 81 mL/min/{1.73_m2} (ref >59)
Glucose: 87 mg/dL (ref 65–99)
Potassium: 3.4 mmol/L — ABNORMAL LOW (ref 3.5–5.2)
Sodium: 143 mmol/L (ref 134–144)

## 2019-05-05 LAB — VITAMIN D 25 HYDROXY (VIT D DEFICIENCY, FRACTURES): Vit D, 25-Hydroxy: 57.6 ng/mL (ref 30.0–100.0)

## 2019-05-05 MED ORDER — POTASSIUM CHLORIDE ER 10 MEQ PO TBCR: 10.0000 meq | EXTENDED_RELEASE_TABLET | Freq: Every day | ORAL | 0 refills | Status: DC

## 2019-05-06 ENCOUNTER — Ambulatory Visit (INDEPENDENT_AMBULATORY_CARE_PROVIDER_SITE_OTHER): Payer: Medicare Other | Admitting: Family Medicine

## 2019-05-06 ENCOUNTER — Other Ambulatory Visit: Payer: Self-pay

## 2019-05-06 ENCOUNTER — Encounter: Payer: Self-pay | Admitting: Family Medicine

## 2019-05-06 DIAGNOSIS — R2681 Unsteadiness on feet: Secondary | ICD-10-CM

## 2019-05-06 DIAGNOSIS — M25562 Pain in left knee: Secondary | ICD-10-CM

## 2019-05-06 DIAGNOSIS — I10 Essential (primary) hypertension: Secondary | ICD-10-CM | POA: Diagnosis not present

## 2019-05-06 DIAGNOSIS — M25512 Pain in left shoulder: Secondary | ICD-10-CM

## 2019-05-06 DIAGNOSIS — M25511 Pain in right shoulder: Secondary | ICD-10-CM

## 2019-05-06 DIAGNOSIS — F0150 Vascular dementia without behavioral disturbance: Secondary | ICD-10-CM | POA: Diagnosis not present

## 2019-05-06 DIAGNOSIS — Z780 Asymptomatic menopausal state: Secondary | ICD-10-CM

## 2019-05-06 DIAGNOSIS — E559 Vitamin D deficiency, unspecified: Secondary | ICD-10-CM | POA: Diagnosis not present

## 2019-05-06 DIAGNOSIS — R54 Age-related physical debility: Secondary | ICD-10-CM

## 2019-05-06 DIAGNOSIS — M25561 Pain in right knee: Secondary | ICD-10-CM

## 2019-05-06 DIAGNOSIS — E782 Mixed hyperlipidemia: Secondary | ICD-10-CM | POA: Diagnosis not present

## 2019-05-06 NOTE — Patient Instructions (Signed)
Continue to drink plenty of water and fluids and stay well-hydrated Use walker all the time Move slowly so as not to put yourself at risk for falling Practice good respiratory and hand hygiene

## 2019-05-06 NOTE — Addendum Note (Signed)
Addended by: Magdalene River on: 05/06/2019 02:12 PM   Modules accepted: Orders

## 2019-05-06 NOTE — Progress Notes (Signed)
Virtual Visit Via telephone Note I connected with@ on 05/06/19 by telephone and verified that I am speaking with the correct person or authorized healthcare agent using two identifiers. Melissa Hall is currently located at home and there are no unauthorized people in close proximity. I completed this visit while in a private location in my home .  This visit type was conducted due to national recommendations for restrictions regarding the COVID-19 Pandemic (e.g. social distancing).  This format is felt to be most appropriate for this patient at this time.  All issues noted in this document were discussed and addressed.  No physical exam was performed.    I discussed the limitations, risks, security and privacy concerns of performing an evaluation and management service by telephone and the availability of in person appointments. I also discussed with the patient that there may be a patient responsible charge related to this service. The patient expressed understanding and agreed to proceed.   Date:  05/06/2019    ID:  Janace Aris      25-Jul-1928        [id number]   Patient Care Team Patient Care Team: Ernestina Penna, MD as PCP - General (Family Medicine) Michaelle Copas, MD as Referring Physician (Optometry) Marjory Lies, Glenford Bayley, MD as Consulting Physician (Neurology)  Reason for Visit: Primary Care Follow-up     History of Present Illness & Review of Systems:     Melissa Hall is a 85 y.o. year old female primary care patient that presents today for a telehealth visit.  Patient lives with her husband at home and does have a worsening vascular dementia.  She has had multiple falls secondary to weakness in her lower extremities and arthritis.  She uses her walker regularly and is constantly reminded to use this.  Both the patient and her husband are monitored closely by their son and daughter-in-law and grandson.  The patient has had recent lab work and this has been reviewed  with the patient and her husband and it was noted that the potassium was slightly decreased and she was started on potassium 10 mEq once daily with a plan to repeat the BMP in about 4 weeks.  The LDL-C cholesterol was slightly increased but there will be no change and cholesterol medicine she will continue with therapeutic lifestyle changes.  Liver function tests were good vitamin D was good and the CBC was within normal limits.  Review of systems as stated, otherwise negative.  The patient does not have symptoms concerning for COVID-19 infection (fever, chills, cough, or new shortness of breath).      Current Medications (Verified) Allergies as of 05/06/2019      Reactions   Keflex [cephalexin] Rash      Medication List       Accurate as of May 06, 2019 11:02 AM. If you have any questions, ask your nurse or doctor.        aspirin EC 81 MG tablet Take 81 mg by mouth daily.   calcium citrate-vitamin D 315-200 MG-UNIT tablet Commonly known as:  CITRACAL+D Take 1 tablet by mouth daily.   cholecalciferol 1000 units tablet Commonly known as:  VITAMIN D Take 2,000 Units by mouth daily.   donepezil 5 MG tablet Commonly known as:  Aricept Take 1 tablet (5 mg total) by mouth at bedtime.   FISH OIL PO Take by mouth.   hydrochlorothiazide 12.5 MG capsule Commonly known as:  MICROZIDE  TAKE (1) CAPSULE DAILY   multivitamin with minerals tablet Take 1 tablet by mouth daily.   potassium chloride 10 MEQ tablet Commonly known as:  K-DUR Take 1 tablet (10 mEq total) by mouth daily.   pravastatin 10 MG tablet Commonly known as:  PRAVACHOL TAKE 1 TABLET DAILY           Allergies (Verified)    Keflex [cephalexin]  Past Medical History Past Medical History:  Diagnosis Date  . Arthritis   . Cataract   . GERD (gastroesophageal reflux disease)   . High cholesterol   . Hypertension   . Osteoporosis      Past Surgical History:  Procedure Laterality Date  . ABDOMINAL  HYSTERECTOMY    . APPENDECTOMY    . BACK SURGERY    . CATARACT EXTRACTION W/PHACO  10/18/2012   Procedure: CATARACT EXTRACTION PHACO AND INTRAOCULAR LENS PLACEMENT (IOC);  Surgeon: Gemma Payor, MD;  Location: [facility];  Service: Ophthalmology;  Laterality: Left;  CDE:17.69  . COLONOSCOPY    . EYE SURGERY    . JOINT REPLACEMENT     bilat  . ORIF HUMERUS FRACTURE Left 05/12/2014   Procedure: OPEN REDUCTION INTERNAL FIXATION (ORIF) PROXIMAL HUMERUS FRACTURE;  Surgeon: Eldred Manges, MD;  Location: [facility];  Service: Orthopedics;  Laterality: Left;  Open Reduction Internal Fixation Left Proximal Humerus  . TONSILLECTOMY      Social History   Socioeconomic History  . Marital status: Married    Spouse name: Not on file  . Number of children: Not on file  . Years of education: 2  . Highest education level: Associate degree: occupational, Scientist, product/process development, or vocational program  Occupational History  . Not on file  Social Needs  . Financial resource strain: Not hard at all  . Food insecurity:    Worry: Never true    Inability: Never true  . Transportation needs:    Medical: No    Non-medical: No  Tobacco Use  . Smoking status: Never Smoker  . Smokeless tobacco: Never Used  Substance and Sexual Activity  . Alcohol use: No  . Drug use: No  . Sexual activity: Not Currently  Lifestyle  . Physical activity:    Days per week: 0 days    Minutes per session: 0 min  . Stress: Only a little  Relationships  . Social connections:    Talks on phone: More than three times a week    Gets together: More than three times a week    Attends religious service: More than 4 times per year    Active member of club or organization: Yes    Attends meetings of clubs or organizations: More than 4 times per year    Relationship status: Not on file  Other Topics Concern  . Not on file  Social History Narrative   Lives at home with her husband of 66 years. They have two adult children. Their son lives locally and  their daughter lives at [address]. They have several grandchildren. She is retired and is mostly sedentary. She does do some cooking.      Family History  Problem Relation Age of Onset  . Heart disease Mother        Died age 24  . Osteoporosis Mother   . Heart disease Father        Died in his 76s  . Cancer Father   . Stroke Father   . Suicidality Brother   . Suicidality Daughter  Labs/Other Tests and Data Reviewed:    Wt Readings from Last 3 Encounters:  01/04/19 129 lb (58.5 kg)  10/20/18 127 lb (57.6 kg)  10/18/18 126 lb (57.2 kg)   Temp Readings from Last 3 Encounters:  01/04/19 97.8 F (36.6 C) (Oral)  07/29/18 97.7 F (36.5 C) (Oral)  02/12/18 (!) 96.5 F (35.8 C) (Oral)   BP Readings from Last 3 Encounters:  01/04/19 107/69  10/20/18 130/90  10/18/18 121/82   Pulse Readings from Last 3 Encounters:  01/04/19 80  10/20/18 (!) 56  10/18/18 82     Lab Results  Component Value Date   HGBA1C 5.6 04/15/2013   Lab Results  Component Value Date   LDLCALC 88 05/04/2019   CREATININE 0.61 05/04/2019       Chemistry      Component Value Date/Time   NA 143 05/04/2019 1008   K 3.4 (L) 05/04/2019 1008   CL 102 05/04/2019 1008   CO2 27 05/04/2019 1008   BUN 13 05/04/2019 1008   CREATININE 0.61 05/04/2019 1008   CREATININE 0.52 05/25/2013 1354      Component Value Date/Time   CALCIUM 9.7 05/04/2019 1008   ALKPHOS 63 05/04/2019 1008   AST 28 05/04/2019 1008   ALT 16 05/04/2019 1008   BILITOT 0.6 05/04/2019 1008         OBSERVATIONS/ OBJECTIVE:     The patient was by herself in the house seated with her walker while I was talking with her.  Her husband was in the basement working on his lawnmower.  She responded appropriately to all questions asked of her.  She seemed to understand everything I asked her.  She denied any chest pain or shortness of breath or PND.  She denied any trouble with swallowing heartburn indigestion nausea vomiting  diarrhea blood in the stool black tarry bowel movements or change in bowel habits.  She denied any trouble passing her water.  She denied having any recent falls or injuries.  She says she is drinking plenty of water and that she has a good appetite.  We did review the lab work results with her recently indicating that her LDL-C was slightly increased and that her potassium was slightly decreased and she knows that she was started on some potassium because of the low potassium on her blood work and that she needs to have a repeat of the BMP in about 4 weeks in the office.  Physical exam deferred due to nature of telephonic visit.  ASSESSMENT & PLAN    Time:   Today, I have spent 21 minutes with the patient via telephone discussing the above including Covid precautions.     Visit Diagnoses: 1. Essential hypertension -No home blood pressure readings to report.  Patient will continue with current treatment  2. Vascular dementia without behavioral disturbance (HCC) -Continue with Aricept 5 mg 1 daily  3. Postmenopausal -Patient was reminded today to use her walker constantly and to make every effort to keep from falling and to keep taking vitamin D and calcium  4. Vitamin D deficiency -Recent vitamin D level was good and she will continue current treatment  5. Mixed hyperlipidemia -Recent LDL-C was slightly increased but she will continue with her current treatment of pravastatin 10 mg daily  6. Gait instability -Use walker regularly  7. Frailty syndrome in geriatric patient -Walk and exercise with a walker as much at home to keep stronger  Patient Instructions  Continue to drink plenty of  water and fluids and stay well-hydrated Use walker all the time Move slowly so as not to put yourself at risk for falling Practice good respiratory and hand hygiene  Plan to repeat BMP in 4 weeks in office to follow-up on low potassium   The above assessment and management plan was discussed  with the patient. The patient verbalized understanding of and has agreed to the management plan. Patient is aware to call the clinic if symptoms persist or worsen. Patient is aware when to return to the clinic for a follow-up visit. Patient educated on when it is appropriate to go to the emergency department.    Ernestina Pennaonald W. Moore, MD [facility] [address], [place], [place] [id number] Ph [phone]   Nyra Capeson W. Moore MD

## 2019-06-28 ENCOUNTER — Encounter (HOSPITAL_COMMUNITY): Payer: Self-pay | Admitting: Emergency Medicine

## 2019-06-28 ENCOUNTER — Emergency Department (HOSPITAL_COMMUNITY): Payer: Medicare Other

## 2019-06-28 ENCOUNTER — Emergency Department (HOSPITAL_COMMUNITY)
Admission: EM | Admit: 2019-06-28 | Discharge: 2019-06-28 | Disposition: A | Discharge status: Home / Self Care | Payer: Medicare Other | Attending: Emergency Medicine | Admitting: Emergency Medicine

## 2019-06-28 ENCOUNTER — Telehealth: Payer: Self-pay | Admitting: Family Medicine

## 2019-06-28 ENCOUNTER — Other Ambulatory Visit: Payer: Self-pay

## 2019-06-28 DIAGNOSIS — S6992XA Unspecified injury of left wrist, hand and finger(s), initial encounter: Secondary | ICD-10-CM | POA: Diagnosis present

## 2019-06-28 DIAGNOSIS — Y929 Unspecified place or not applicable: Secondary | ICD-10-CM | POA: Insufficient documentation

## 2019-06-28 DIAGNOSIS — Y999 Unspecified external cause status: Secondary | ICD-10-CM | POA: Insufficient documentation

## 2019-06-28 DIAGNOSIS — I10 Essential (primary) hypertension: Secondary | ICD-10-CM | POA: Diagnosis not present

## 2019-06-28 DIAGNOSIS — Y939 Activity, unspecified: Secondary | ICD-10-CM | POA: Diagnosis not present

## 2019-06-28 DIAGNOSIS — W19XXXA Unspecified fall, initial encounter: Secondary | ICD-10-CM | POA: Insufficient documentation

## 2019-06-28 DIAGNOSIS — S62102A Fracture of unspecified carpal bone, left wrist, initial encounter for closed fracture: Secondary | ICD-10-CM

## 2019-06-28 DIAGNOSIS — S52502A Unspecified fracture of the lower end of left radius, initial encounter for closed fracture: Secondary | ICD-10-CM | POA: Diagnosis not present

## 2019-06-28 DIAGNOSIS — Z79899 Other long term (current) drug therapy: Secondary | ICD-10-CM | POA: Insufficient documentation

## 2019-06-28 MED ORDER — ACETAMINOPHEN 325 MG PO TABS
650.0000 mg | ORAL_TABLET | Freq: Once | ORAL | Status: AC
  Administered 2019-06-28: 650 mg via ORAL
  Filled 2019-06-28: qty 2

## 2019-06-28 MED ORDER — HYDROCODONE-ACETAMINOPHEN 5-325 MG PO TABS: 1.0000 | ORAL_TABLET | Freq: Four times a day (QID) | ORAL | 0 refills | Status: DC | PRN

## 2019-06-28 NOTE — ED Notes (Signed)
ED Provider at bedside. 

## 2019-06-28 NOTE — ED Provider Notes (Addendum)
[facility]ENCY [facility]MENT Provider Note   CSN: [id number] Arrival date & time: 06/28/19  [id number]    History   Chief Complaint No chief complaint on file.   HPI Melissa Hall is a 85 y.o. female.     Accidental fall in the middle the night injuring her left wrist.  No other obvious injuries.  Mental status normal.  Severity of pain is moderate.  Palpation position make pain worse.     Past Medical History:  Diagnosis Date  . Arthritis   . Cataract   . GERD (gastroesophageal reflux disease)   . High cholesterol   . Hypertension   . Osteoporosis     Patient Active Problem List   Diagnosis Date Noted  . Frailty syndrome in geriatric patient 05/06/2019  . Fracture of proximal end of left humerus 05/13/2014  . Traumatic closed displaced fracture of proximal end of left humerus 05/12/2014    Class: Acute  . Proximal humerus fracture 05/12/2014  . Osteoporosis, post-menopausal 01/25/2014  . Hyperlipidemia 12/14/2013  . Hypertension 12/14/2013  . Vitamin D deficiency 12/14/2013  . Scoliosis (and kyphoscoliosis), idiopathic 12/14/2013    Past Surgical History:  Procedure Laterality Date  . ABDOMINAL HYSTERECTOMY    . APPENDECTOMY    . BACK SURGERY    . CATARACT EXTRACTION W/PHACO  10/18/2012   Procedure: CATARACT EXTRACTION PHACO AND INTRAOCULAR LENS PLACEMENT (IOC);  Surgeon: Gemma PayorKerry Hunt, MD;  Location: [facility] OR[facility];  Service: Ophthalmology;  Laterality: Left;  CDE:17.69  . COLONOSCOPY    . EYE SURGERY    . JOINT REPLACEMENT     bilat  . ORIF HUMERUS FRACTURE Left 05/12/2014   Procedure: OPEN REDUCTION INTERNAL FIXATION (ORIF) PROXIMAL HUMERUS FRACTURE;  Surgeon: Eldred MangesMark C Yates, MD;  Location: [facility] OR;  Service: Orthopedics;  Laterality: Left;  Open Reduction Internal Fixation Left Proximal Humerus  . TONSILLECTOMY       OB History   No obstetric history on file.      Home Medications    Prior to Admission medications   Medication Sig Start Date End Date  Taking? Authorizing Provider  calcium citrate-vitamin D (CITRACAL+D) 315-200 MG-UNIT per tablet Take 1 tablet by mouth daily.    Yes [provider]  cholecalciferol (VITAMIN D) 1000 UNITS tablet Take 2,000 Units by mouth daily.    Yes [provider]  donepezil (ARICEPT) 5 MG tablet Take 1 tablet (5 mg total) by mouth at bedtime. 01/04/19  Yes Ernestina PennaMoore, Donald W, MD  hydrochlorothiazide (MICROZIDE) 12.5 MG capsule TAKE (1) CAPSULE DAILY 02/17/19  Yes Ernestina PennaMoore, Donald W, MD  Multiple Vitamins-Minerals (MULTIVITAMIN WITH MINERALS) tablet Take 1 tablet by mouth daily.   Yes [provider]  Omega-3 Fatty Acids (FISH OIL PO) Take by mouth.   Yes [provider]  potassium chloride (K-DUR) 10 MEQ tablet Take 1 tablet (10 mEq total) by mouth daily. 05/05/19  Yes Ernestina PennaMoore, Donald W, MD  pravastatin (PRAVACHOL) 10 MG tablet TAKE 1 TABLET DAILY 02/17/19  Yes Ernestina PennaMoore, Donald W, MD    Family History Family History  Problem Relation Age of Onset  . Heart disease Mother        Died age 83  . Osteoporosis Mother   . Heart disease Father        Died in his 4480s  . Cancer Father   . Stroke Father   . Suicidality Brother   . Suicidality Daughter     Social History Social History   Tobacco Use  .  Smoking status: Never Smoker  . Smokeless tobacco: Never Used  Substance Use Topics  . Alcohol use: No  . Drug use: No     Allergies   Keflex [cephalexin]   Review of Systems Review of Systems  All other systems reviewed and are negative.    Physical Exam Updated Vital Signs BP 133/65 (BP Location: Right Arm)   Pulse 71   Temp 97.9 F (36.6 C) (Oral)   Resp 16   Ht 5\' 2"  (1.575 m)   Wt 58.1 kg   SpO2 99%   BMI 23.41 kg/m   Physical Exam Vitals signs and nursing note reviewed.  Constitutional:      Appearance: She is well-developed.  HENT:     Head: Normocephalic and atraumatic.  Eyes:     Conjunctiva/sclera: Conjunctivae normal.  Neck:      Musculoskeletal: Neck supple.  Cardiovascular:     Rate and Rhythm: Normal rate and regular rhythm.  Pulmonary:     Effort: Pulmonary effort is normal.     Breath sounds: Normal breath sounds.  Abdominal:     General: Bowel sounds are normal.     Palpations: Abdomen is soft.  Musculoskeletal:     Comments: Left upper extremity: Obvious ecchymosis on the anterior aspect of the radius.  Pain with range of motion.  Skin:    General: Skin is warm and dry.  Neurological:     Mental Status: She is alert and oriented to person, place, and time.  Psychiatric:        Behavior: Behavior normal.      ED Treatments / Results  Labs (all labs ordered are listed, but only abnormal results are displayed) Labs Reviewed - No data to display  EKG None  Radiology Dg Wrist Complete Left  Result Date: 06/28/2019 CLINICAL DATA:  Left wrist pain after fall last night. Initial encounter. EXAM: LEFT WRIST - COMPLETE 3+ VIEW COMPARISON:  None. FINDINGS: Impacted metaphyseal fracture of the distal radius demonstrates mild dorsal angulation. No disruption of the articular surface is identified. No other fracture is seen. First CMC osteoarthritis is noted. Chondrocalcinosis of the triangular fibrocartilage is seen. There is soft tissue swelling about the wrist. IMPRESSION: Acute fracture of the distal metaphysis of the left radius as described above does not appear to disrupt the articular surface. Electronically Signed   By: Inge Rise M.D.   On: 06/28/2019 09:06    Procedures Procedures (including critical care time)  Medications Ordered in ED Medications  acetaminophen (TYLENOL) tablet 650 mg (650 mg Oral Given 06/28/19 0854)     Initial Impression / Assessment and Plan / ED Course  I have reviewed the triage vital signs and the nursing notes.  Pertinent labs & imaging results that were available during my care of the patient were reviewed by me and considered in my medical decision making  (see chart for details).    Status post accidental fall.  Plain films of left wrist pending.  1115: Left wrist x-ray reveals no acute fracture of the distal left radius.  Neurovascular intact.  Will immobilize, pain medicine, referral to orthopedics      Final Clinical Impressions(s) / ED Diagnoses   Final diagnoses:  Fall, initial encounter  Closed fracture of distal end of left radius, unspecified fracture morphology, initial encounter    ED Discharge Orders    None       Nat Christen, MD 06/28/19 2202    Nat Christen, MD 06/28/19 1130

## 2019-06-28 NOTE — ED Notes (Signed)
RN offered ice pack to pt, pt refused.

## 2019-06-28 NOTE — ED Notes (Signed)
Left Message on Dr. Carlean Jews cell to call ED for Dr. Lacinda Axon again at 1123.

## 2019-06-28 NOTE — ED Triage Notes (Signed)
Patient states she fell last night around 11pm.  States she fell because she was clumsy.  C/o of left wrist pain.  Left wrist bruised and swollen.

## 2019-06-28 NOTE — Discharge Instructions (Addendum)
You have broken a wrist bone called the radius.  Wrist immobilizer, rest, ice, elevate, pain medicine.  You need to call the bone doctor with the number provided for follow-up.

## 2019-06-28 NOTE — Telephone Encounter (Signed)
Covering PCP- please advise  

## 2019-06-29 DIAGNOSIS — M25532 Pain in left wrist: Secondary | ICD-10-CM | POA: Diagnosis not present

## 2019-06-29 DIAGNOSIS — S52502A Unspecified fracture of the lower end of left radius, initial encounter for closed fracture: Secondary | ICD-10-CM | POA: Diagnosis not present

## 2019-07-13 DIAGNOSIS — S52502D Unspecified fracture of the lower end of left radius, subsequent encounter for closed fracture with routine healing: Secondary | ICD-10-CM | POA: Diagnosis not present

## 2019-07-13 DIAGNOSIS — M25532 Pain in left wrist: Secondary | ICD-10-CM | POA: Diagnosis not present

## 2019-07-21 ENCOUNTER — Other Ambulatory Visit: Payer: Self-pay | Admitting: *Deleted

## 2019-07-21 MED ORDER — HYDROCHLOROTHIAZIDE 12.5 MG PO CAPS: ORAL_CAPSULE | ORAL | 2 refills | Status: DC

## 2019-08-01 DIAGNOSIS — M25532 Pain in left wrist: Secondary | ICD-10-CM | POA: Diagnosis not present

## 2019-08-01 DIAGNOSIS — S52502D Unspecified fracture of the lower end of left radius, subsequent encounter for closed fracture with routine healing: Secondary | ICD-10-CM | POA: Diagnosis not present

## 2019-08-01 DIAGNOSIS — Z4789 Encounter for other orthopedic aftercare: Secondary | ICD-10-CM | POA: Diagnosis not present

## 2019-08-17 ENCOUNTER — Other Ambulatory Visit: Payer: Self-pay | Admitting: Family Medicine

## 2019-09-07 ENCOUNTER — Other Ambulatory Visit: Payer: Self-pay

## 2019-09-08 ENCOUNTER — Encounter: Payer: Self-pay | Admitting: Family Medicine

## 2019-09-08 ENCOUNTER — Ambulatory Visit (INDEPENDENT_AMBULATORY_CARE_PROVIDER_SITE_OTHER): Payer: Medicare Other | Admitting: Family Medicine

## 2019-09-08 VITALS — BP 118/71 | HR 80 | Temp 98.4°F | Ht 62.0 in | Wt 125.4 lb

## 2019-09-08 DIAGNOSIS — E782 Mixed hyperlipidemia: Secondary | ICD-10-CM

## 2019-09-08 DIAGNOSIS — E559 Vitamin D deficiency, unspecified: Secondary | ICD-10-CM | POA: Diagnosis not present

## 2019-09-08 DIAGNOSIS — R413 Other amnesia: Secondary | ICD-10-CM | POA: Diagnosis not present

## 2019-09-08 DIAGNOSIS — Z23 Encounter for immunization: Secondary | ICD-10-CM

## 2019-09-08 DIAGNOSIS — I10 Essential (primary) hypertension: Secondary | ICD-10-CM

## 2019-09-08 DIAGNOSIS — F03918 Unspecified dementia, unspecified severity, with other behavioral disturbance: Secondary | ICD-10-CM | POA: Insufficient documentation

## 2019-09-08 DIAGNOSIS — F0391 Unspecified dementia with behavioral disturbance: Secondary | ICD-10-CM | POA: Insufficient documentation

## 2019-09-08 NOTE — Progress Notes (Signed)
There were no vitals taken for this visit.   Subjective:   Patient ID: Melissa Hall, female    DOB: [birth year], 85 y.o.   MRN: [id number]  HPI: Melissa Hall is a 85 y.o. female presenting on 09/08/2019 for Hypertension (4 month ) and Medical Management of Chronic Issues   HPI Hypertension  Patient is currently on hydrochlorothiazide, and their blood pressure today is 118/71. Patient denies any lightheadedness or dizziness. Patient denies headaches, blurred vision, chest pains, shortness of breath, or weakness. Denies any side effects from medication and is content with current medication.   Hyperlipidemia Patient is coming in for recheck of his hyperlipidemia. The patient is currently taking pravastatin. They deny any issues with myalgias or history of liver damage from it. They deny any focal numbness or weakness or chest pain.   Memory disorder and on Aricept Patient has memory disorder and she is currently on Aricept and they do not know if it helped or not but she has not had any side effects and they wish to continue it.  She says she has some short-term memory issues and her spouse is so as well.  Patient has been diagnosed with osteoporosis and it looks like she is due for bone density scan or possibly having in January, she cannot wear try to track that down see if she actually needs 1, will discuss this at her next visit.  Relevant past medical, surgical, family and social history reviewed and updated as indicated. Interim medical history since our last visit reviewed. Allergies and medications reviewed and updated.  Review of Systems  Constitutional: Negative for chills and fever.  Eyes: Negative for redness and visual disturbance.  Respiratory: Negative for chest tightness and shortness of breath.   Cardiovascular: Negative for chest pain and leg swelling.  Skin: Negative for rash.  Neurological: Negative for light-headedness and headaches.  All other systems  reviewed and are negative.   Per HPI unless specifically indicated above   Allergies as of 09/08/2019      Reactions   Keflex [cephalexin] Rash      Medication List       Accurate as of September 08, 2019  9:12 AM. If you have any questions, ask your nurse or doctor.        STOP taking these medications   HYDROcodone-acetaminophen 5-325 MG tablet Commonly known as: NORCO/VICODIN Stopped by: Fransisca Kaufmann Prestin Munch, MD     TAKE these medications   ASPIRIN 81 PO aspirin 81 mg tablet,delayed release   81 mg by oral route.   calcium citrate-vitamin D 315-200 MG-UNIT tablet Commonly known as: CITRACAL+D Take 1 tablet by mouth daily.   cholecalciferol 1000 units tablet Commonly known as: VITAMIN D Take 2,000 Units by mouth daily.   donepezil 5 MG tablet Commonly known as: Aricept Take 1 tablet (5 mg total) by mouth at bedtime.   FISH OIL PO Take by mouth.   hydrochlorothiazide 12.5 MG capsule Commonly known as: MICROZIDE TAKE (1) CAPSULE DAILY   multivitamin with minerals tablet Take 1 tablet by mouth daily.   potassium chloride 10 MEQ tablet Commonly known as: K-DUR Take 1 tablet (10 mEq total) by mouth daily.   pravastatin 10 MG tablet Commonly known as: PRAVACHOL TAKE 1 TABLET DAILY        Objective:   There were no vitals taken for this visit.  Wt Readings from Last 3 Encounters:  06/28/19 128 lb (58.1 kg)  01/04/19 129 lb (58.5 kg)  10/20/18 127 lb (57.6 kg)    Physical Exam Vitals signs and nursing note reviewed.  Constitutional:      General: She is not in acute distress.    Appearance: She is well-developed. She is not diaphoretic.  Eyes:     Conjunctiva/sclera: Conjunctivae normal.  Cardiovascular:     Rate and Rhythm: Normal rate and regular rhythm.     Heart sounds: Normal heart sounds. No murmur.  Pulmonary:     Effort: Pulmonary effort is normal. No respiratory distress.     Breath sounds: Normal breath sounds. No wheezing.   Musculoskeletal: Normal range of motion.        General: No tenderness.  Skin:    General: Skin is warm and dry.     Findings: No rash.  Neurological:     Mental Status: She is alert and oriented to person, place, and time.     Coordination: Coordination normal.  Psychiatric:        Behavior: Behavior normal.        Cognition and Memory: Memory is impaired. She exhibits impaired recent memory.       Assessment & Plan:   Problem List Items Addressed This Visit      Cardiovascular and Mediastinum   Hypertension - Primary   Relevant Medications   ASPIRIN 81 PO   Other Relevant Orders   BMP8+EGFR     Other   Hyperlipidemia   Relevant Medications   ASPIRIN 81 PO   Vitamin D deficiency   Relevant Orders   VITAMIN D 25 Hydroxy (Vit-D Deficiency, Fractures)   Memory disorder      No changes in current medication, seems like she is doing very well, continue hydrochlorothiazide and Aricept and Pravachol Follow up plan: Return in about 6 months (around 03/07/2020), or if symptoms worsen or fail to improve, for Hypertension and cholesterol recheck.  Counseling provided for all of the vaccine components No orders of the defined types were placed in this encounter.   Caryl Pina, MD [facility] 09/08/2019, 9:12 AM

## 2019-09-09 LAB — BMP8+EGFR
BUN/Creatinine Ratio: 18 (ref 12–28)
BUN: 11 mg/dL (ref 8–27)
CO2: 28 mmol/L (ref 20–29)
Calcium: 9.9 mg/dL (ref 8.7–10.3)
Chloride: 104 mmol/L (ref 96–106)
Creatinine, Ser: 0.6 mg/dL (ref 0.57–1.00)
GFR calc Af Amer: 94 mL/min/{1.73_m2} (ref >59)
GFR calc non Af Amer: 82 mL/min/{1.73_m2} (ref >59)
Glucose: 92 mg/dL (ref 65–99)
Potassium: 4.4 mmol/L (ref 3.5–5.2)
Sodium: 144 mmol/L (ref 134–144)

## 2019-09-09 LAB — VITAMIN D 25 HYDROXY (VIT D DEFICIENCY, FRACTURES): Vit D, 25-Hydroxy: 55.8 ng/mL (ref 30.0–100.0)

## 2019-10-23 ENCOUNTER — Other Ambulatory Visit: Payer: Self-pay | Admitting: Family Medicine

## 2019-11-19 ENCOUNTER — Other Ambulatory Visit: Payer: Self-pay | Admitting: Family Medicine

## 2019-11-22 ENCOUNTER — Other Ambulatory Visit: Payer: Self-pay | Admitting: Family Medicine

## 2019-12-26 ENCOUNTER — Other Ambulatory Visit: Payer: Self-pay | Admitting: Family Medicine

## 2020-01-10 ENCOUNTER — Other Ambulatory Visit: Payer: Self-pay

## 2020-01-10 DIAGNOSIS — M810 Age-related osteoporosis without current pathological fracture: Secondary | ICD-10-CM

## 2020-01-10 DIAGNOSIS — E559 Vitamin D deficiency, unspecified: Secondary | ICD-10-CM

## 2020-01-24 ENCOUNTER — Other Ambulatory Visit: Payer: Self-pay | Admitting: Family Medicine

## 2020-01-24 DIAGNOSIS — Z23 Encounter for immunization: Secondary | ICD-10-CM | POA: Diagnosis not present

## 2020-02-21 ENCOUNTER — Other Ambulatory Visit: Payer: Self-pay | Admitting: Family Medicine

## 2020-02-21 ENCOUNTER — Other Ambulatory Visit: Payer: Self-pay | Admitting: *Deleted

## 2020-02-21 MED ORDER — PRAVASTATIN SODIUM 10 MG PO TABS: 10.0000 mg | ORAL_TABLET | Freq: Every day | ORAL | 0 refills | Status: DC

## 2020-02-23 ENCOUNTER — Other Ambulatory Visit: Payer: Self-pay | Admitting: Family Medicine

## 2020-03-08 ENCOUNTER — Ambulatory Visit: Payer: Medicare Other

## 2020-03-08 ENCOUNTER — Encounter: Payer: Self-pay | Admitting: Family Medicine

## 2020-03-08 ENCOUNTER — Ambulatory Visit (INDEPENDENT_AMBULATORY_CARE_PROVIDER_SITE_OTHER): Payer: Medicare Other | Admitting: Family Medicine

## 2020-03-08 DIAGNOSIS — I10 Essential (primary) hypertension: Secondary | ICD-10-CM

## 2020-03-08 DIAGNOSIS — R413 Other amnesia: Secondary | ICD-10-CM

## 2020-03-08 DIAGNOSIS — E782 Mixed hyperlipidemia: Secondary | ICD-10-CM | POA: Diagnosis not present

## 2020-03-08 MED ORDER — HYDROCHLOROTHIAZIDE 12.5 MG PO CAPS: 12.5000 mg | ORAL_CAPSULE | Freq: Every day | ORAL | 3 refills | Status: DC

## 2020-03-08 MED ORDER — PRAVASTATIN SODIUM 10 MG PO TABS: 10.0000 mg | ORAL_TABLET | Freq: Every day | ORAL | 3 refills | Status: DC

## 2020-03-08 NOTE — Progress Notes (Signed)
Virtual Visit via telephone Note  I connected with Melissa Hall on 03/08/20 at [phone] by telephone and verified that I am speaking with the correct person using two identifiers. Melissa Hall is currently located at home and Husband are currently with her during visit. The provider, Fransisca Kaufmann Alfonse Garringer, MD is located in their office at time of visit.  Call ended at 0850  I discussed the limitations, risks, security and privacy concerns of performing an evaluation and management service by telephone and the availability of in person appointments. I also discussed with the patient that there may be a patient responsible charge related to this service. The patient expressed understanding and agreed to proceed.   History and Present Illness: Hypertension Patient is currently on hctz 12.5, and their blood pressure today is unknown. Patient denies any lightheadedness or dizziness. Patient denies headaches, blurred vision, chest pains, shortness of breath, or weakness. Denies any side effects from medication and is content with current medication.   Hyperlipidemia Patient is coming in for recheck of his hyperlipidemia. The patient is currently taking pravastatin and fish oils. They deny any issues with myalgias or history of liver damage from it. They deny any focal numbness or weakness or chest pain.   Memory disorder Patient says she does not take medicine  1. Essential hypertension   2. Memory disorder   3. Mixed hyperlipidemia     Outpatient Encounter Medications as of 03/08/2020  Medication Sig  . ASPIRIN 81 PO aspirin 81 mg tablet,delayed release   81 mg by oral route.  . calcium citrate-vitamin D (CITRACAL+D) 315-200 MG-UNIT per tablet Take 1 tablet by mouth daily.   . cholecalciferol (VITAMIN D) 1000 UNITS tablet Take 2,000 Units by mouth daily.   . hydrochlorothiazide (MICROZIDE) 12.5 MG capsule Take 1 capsule (12.5 mg total) by mouth daily.  . Multiple Vitamins-Minerals  (MULTIVITAMIN WITH MINERALS) tablet Take 1 tablet by mouth daily.  . Omega-3 Fatty Acids (FISH OIL PO) Take by mouth.  . pravastatin (PRAVACHOL) 10 MG tablet Take 1 tablet (10 mg total) by mouth daily.  . [DISCONTINUED] donepezil (ARICEPT) 5 MG tablet Take 1 tablet (5 mg total) by mouth at bedtime.  . [DISCONTINUED] hydrochlorothiazide (MICROZIDE) 12.5 MG capsule TAKE (1) CAPSULE DAILY.  Needs to be seen before next refill.  . [DISCONTINUED] potassium chloride (K-DUR) 10 MEQ tablet Take 1 tablet (10 mEq total) by mouth daily.  . [DISCONTINUED] pravastatin (PRAVACHOL) 10 MG tablet Take 1 tablet (10 mg total) by mouth daily. Needs to be seen before next refill.   No facility-administered encounter medications on file as of 03/08/2020.    Review of Systems  Constitutional: Negative for chills and fever.  Eyes: Negative for visual disturbance.  Respiratory: Negative for chest tightness and shortness of breath.   Cardiovascular: Negative for chest pain and leg swelling.  Musculoskeletal: Negative for back pain and gait problem.  Skin: Negative for rash.  Neurological: Negative for light-headedness and headaches.  Psychiatric/Behavioral: Negative for agitation and behavioral problems.  All other systems reviewed and are negative.   Observations/Objective: Patient sounds comfortable and in no acute distress  Assessment and Plan: Problem List Items Addressed This Visit      Cardiovascular and Mediastinum   Hypertension - Primary   Relevant Medications   pravastatin (PRAVACHOL) 10 MG tablet   hydrochlorothiazide (MICROZIDE) 12.5 MG capsule   Other Relevant Orders   CBC with Differential/Platelet   CMP14+EGFR     Other   Hyperlipidemia  Relevant Medications   pravastatin (PRAVACHOL) 10 MG tablet   hydrochlorothiazide (MICROZIDE) 12.5 MG capsule   Other Relevant Orders   Lipid panel   Memory disorder      Continue current medicaiton Follow up plan: Return in about 6 months  (around 09/08/2020), or if symptoms worsen or fail to improve, for htn and hld.     I discussed the assessment and treatment plan with the patient. The patient was provided an opportunity to ask questions and all were answered. The patient agreed with the plan and demonstrated an understanding of the instructions.   The patient was advised to call back or seek an in-person evaluation if the symptoms worsen or if the condition fails to improve as anticipated.  The above assessment and management plan was discussed with the patient. The patient verbalized understanding of and has agreed to the management plan. Patient is aware to call the clinic if symptoms persist or worsen. Patient is aware when to return to the clinic for a follow-up visit. Patient educated on when it is appropriate to go to the emergency department.    I provided 12 minutes of non-face-to-face time during this encounter.    Worthy Rancher, MD

## 2020-03-16 ENCOUNTER — Telehealth: Payer: Self-pay | Admitting: Family Medicine

## 2020-03-16 NOTE — Chronic Care Management (AMB) (Signed)
  Chronic Care Management   Outreach Note  03/16/2020 Name: ALICIA SEIB MRN: [id number] DOB: 05-20-1928  QUINTASHA GREN is a 86 y.o. year old female who is a primary care patient of Dettinger, Elige Radon, MD. I reached out to Janace Aris by phone today in response to a referral sent by Ms. Garry [facility] health plan.     An unsuccessful telephone outreach was attempted today. The patient was referred to the case management team for assistance with care management and care coordination.   Follow Up Plan: The care management team will reach out to the patient again over the next 7 days. If patient returns call to provider office, please advise to call Embedded Care Management Care Guide Gwenevere Ghazi at [phone].  Gwenevere Ghazi  Care Guide, Embedded Care Coordination [facility]  [place], [place] [address] Direct Dial: [phone] [email or web address] Website: [email or web address]

## 2020-03-16 NOTE — Chronic Care Management (AMB) (Signed)
  Chronic Care Management   Outreach Note  03/16/2020 Name: Aviance W Kapler MRN: [id number] DOB: 01/12/1928  Naya W Chhim is a 86 y.o. year old female who is a primary care patient of Dettinger, Joshua A, MD. I reached out to Talin W Rudell by phone today in response to a referral sent by Ms. Kenslee W [place]'s health plan.     An unsuccessful telephone outreach was attempted today. The patient was referred to the case management team for assistance with care management and care coordination.   Follow Up Plan: The care management team will reach out to the patient again over the next 7 days. If patient returns call to provider office, please advise to call Embedded Care Management Care Guide Rilynn Habel at [phone].  Javarius Tsosie  Care Guide, Embedded Care Coordination [place]  [facility]  [place], [place] [address] Direct Dial: [phone] [email or web address] Website: [email or web address]  

## 2020-03-19 NOTE — Chronic Care Management (AMB) (Signed)
  Chronic Care Management   Note  03/19/2020 Name: Melissa Hall MRN: [id number] DOB: 22-May-1928  [name] is a 86 y.o. year old female who is a primary care patient of Dettinger, Fransisca Kaufmann, MD. I reached out to Louisa Second by phone today in response to a referral sent by Ms. Kristine [facility] health plan.     Ms. Sarno was given information about Chronic Care Management services today including:  1. CCM service includes personalized support from designated clinical staff supervised by her physician, including individualized plan of care and coordination with other care providers 2. 24/7 contact phone numbers for assistance for urgent and routine care needs. 3. Service will only be billed when office clinical staff spend 20 minutes or more in a month to coordinate care. 4. Only one practitioner may furnish and bill the service in a calendar month. 5. The patient may stop CCM services at any time (effective at the end of the month) by phone call to the office staff. 6. The patient will be responsible for cost sharing (co-pay) of up to 20% of the service fee (after annual deductible is met).  Patient agreed to services and verbal consent obtained.   Follow up plan: Telephone appointment with care management team member scheduled for:10/30/2020.  [place], [place] [address] Direct Dial: [phone] [email or web address]

## 2020-10-30 ENCOUNTER — Telehealth: Payer: Self-pay | Admitting: *Deleted

## 2020-10-30 ENCOUNTER — Ambulatory Visit: Payer: Medicare Other | Admitting: *Deleted

## 2020-10-30 DIAGNOSIS — F0150 Vascular dementia without behavioral disturbance: Secondary | ICD-10-CM

## 2020-10-30 DIAGNOSIS — R54 Age-related physical debility: Secondary | ICD-10-CM

## 2020-10-30 DIAGNOSIS — E782 Mixed hyperlipidemia: Secondary | ICD-10-CM

## 2020-10-30 DIAGNOSIS — I10 Essential (primary) hypertension: Secondary | ICD-10-CM

## 2020-10-30 NOTE — Chronic Care Management (AMB) (Signed)
Chronic Care Management   Initial Visit Note  10/30/2020 Name: Melissa Hall MRN: [id number] DOB: [birth year]  Referred by: Dettinger, Elige Radon, MD Reason for referral : Chronic Care Management (Initial Visit)   Melissa Hall is a 86 y.o. year old female who is a primary care patient of Dettinger, Elige Radon, MD. The CCM team was consulted for assistance with chronic disease management and care coordination needs related to HTN, HLD, osteoporosis, frailty syndrome in geriatric patient, memory disorder.  Review of patient status, including review of consultants reports, relevant laboratory and other test results, and collaboration with appropriate care team members and the patient's provider was performed as part of comprehensive patient evaluation and provision of chronic care management services.     Subjective I spoke with Ms Loseke and then with her husband, [name], by telephone today regarding management of her chronic medical conditions. I also talked with their daughter-in-law, Karoline Fleer, regarding scheduling a follow-up with Dr Louanne Skye and about my recommendation for a palliative care evaluation.   SDOH Interventions     Most Recent Value  SDOH Interventions  Physical Activity Interventions Intervention Not Indicated  [dementia and high fall risk]      Objective Outpatient Encounter Medications as of 10/30/2020  Medication Sig  . ASPIRIN 81 PO aspirin 81 mg tablet,delayed release   81 mg by oral route.  . calcium citrate-vitamin D (CITRACAL+D) 315-200 MG-UNIT per tablet Take 1 tablet by mouth daily.   . cholecalciferol (VITAMIN D) 1000 UNITS tablet Take 2,000 Units by mouth daily.   . hydrochlorothiazide (MICROZIDE) 12.5 MG capsule Take 1 capsule (12.5 mg total) by mouth daily.  . Multiple Vitamins-Minerals (MULTIVITAMIN WITH MINERALS) tablet Take 1 tablet by mouth daily.  . Omega-3 Fatty Acids (FISH OIL PO) Take by mouth.  . pravastatin (PRAVACHOL) 10 MG tablet Take  1 tablet (10 mg total) by mouth daily.   No facility-administered encounter medications on file as of 10/30/2020.     BP Readings from Last 3 Encounters:  09/08/19 118/71  06/28/19 130/62  01/04/19 107/69   Wt Readings from Last 3 Encounters:  09/08/19 125 lb 6.4 oz (56.9 kg)  06/28/19 128 lb (58.1 kg)  01/04/19 129 lb (58.5 kg)   BMI Readings from Last 3 Encounters:  09/08/19 22.94 kg/m  06/28/19 23.41 kg/m  01/04/19 26.96 kg/m   Lab Results  Component Value Date   CHOL 172 05/04/2019   HDL 71 05/04/2019   LDLCALC 88 05/04/2019   TRIG 65 05/04/2019   CHOLHDL 2.4 05/04/2019     Goals Addressed            This Visit's Progress   . Chronic Disease Management Needs       CARE PLAN ENTRY (see longtitudinal plan of care for additional care plan information)  Current Barriers:  . Chronic Disease Management support, education, and care coordination needs related to HTN, HLD, osteoporosis, frailty syndrome in geriatric patient, vascular dementia  Clinical Goal(s) related to HTN, HLD, osteoporosis, frailty syndrome in geriatric patient, vascular dementia:  Over the next 45 days, patient will:  . Work with the care management team to address educational, disease management, and care coordination needs  . Call care management team with questions or concerns . Verbalize basic understanding of patient centered plan of care established today  Interventions related to HTN, HLD, osteoporosis, frailty syndrome in geriatric patient, vascular dementia:  . Evaluation of current treatment plans and patient's adherence to plan as established by provider .  Assessed patient understanding of disease states . Assessed patient's education and care coordination needs . Provided disease specific education to patient  . Collaborated with appropriate clinical care team members regarding patient needs . Chart reviewed including recent office notes and lab results . Noted that patient is  overdue for f/u with Dr Louanne Skye . Talked with husband, [name], by telephone. Patient has a memory disorder and is unable to answer screening questions on her own.  . Discussed social/family support o Daughter-in-law and son help when needed o Husband can drive locally and runs errands o Lives at home with husband . Reviewed medications . Discussed mobility, falls, and fall prevention o History of falls with wrist and shoulder fracture o Uses a walker for ambulation . Discussed ability to perform ADLs o Bathes and dresses herself o Cooks some but has difficulty due to memory problem o Husband or family buys groceries . Discussed diet and appetite o Significant loss of appetite o Obvious weight loss but unsure of how much . Discussed dental health and that she is waiting to get in with a dentist . Recommended palliative care services and potentially hospice services . Spoke with daughter-in-law, [name], about need for appt with Dr Louanne Skye and about recommendation for palliative care evaluation . Will collaborate with [facility] clinical staff to have them contact daughter-in-law, Alisan Dokes, to schedule an appointment with Dr Louanne Skye . Will collaborate with Dr Dettinger prior to that appt regarding palliative care order . Encouraged patient/family to reach out to PCP as needed . Encouraged patient/family to reach out to RN Care Manager as needed  Patient Self Care Activities related to HTN, HLD, osteoporosis, frailty syndrome in geriatric patient, vascular dementia:  . Patient is unable to independently self-manage chronic health conditions  Initial goal documentation         Plan:   The care management team will reach out to the patient again over the next 30 days.  Britta Mccreedy will be contacted to schedule patient a f/u appt with Dr Dettinger  [name], BSN, RN-BC Embedded Chronic Care Manager [facility] Care Management Direct Dial:  [phone]

## 2020-10-30 NOTE — Telephone Encounter (Signed)
Molli Knock will do yes, get them both an appointment so we can discuss this. Have family come with them

## 2020-10-30 NOTE — Telephone Encounter (Signed)
10/30/2020  I spoke with [name]'s husband, [name], and her daughter-in-law, [name], by telephone today regarding CCM services. Patient is overdue for a f/u with PCP. Request has been sent to clinical staff to reach out to [name] to schedule.   [name] has vascular dementia and has had significant weight loss over the past 6 months. I'm recommending a palliative care/hospice referral for an evaluation. I've talked with [name] and [name] about this and they are agreeable.   Can you discuss this at their follow-up appointment as well as place referral? [name], has also had a decline in his memory over the past few months. I asked clinical staff to make an appt note to do an MMSE at his visit. Palliative Care may be appropriate for him as well.   Forwarding to Dr Dettinger for review.  [name], BSN, RN-BC Embedded Chronic Care Manager [facility] Care Management Direct Dial: [phone]

## 2020-10-30 NOTE — Patient Instructions (Signed)
Visit Information  Goals Addressed            This Visit's Progress   . Chronic Disease Management Needs       CARE PLAN ENTRY (see longtitudinal plan of care for additional care plan information)  Current Barriers:  . Chronic Disease Management support, education, and care coordination needs related to HTN, HLD, osteoporosis, frailty syndrome in geriatric patient, vascular dementia  Clinical Goal(s) related to HTN, HLD, osteoporosis, frailty syndrome in geriatric patient, vascular dementia:  Over the next 45 days, patient will:  . Work with the care management team to address educational, disease management, and care coordination needs  . Call care management team with questions or concerns . Verbalize basic understanding of patient centered plan of care established today  Interventions related to HTN, HLD, osteoporosis, frailty syndrome in geriatric patient, vascular dementia:  . Evaluation of current treatment plans and patient's adherence to plan as established by provider . Assessed patient understanding of disease states . Assessed patient's education and care coordination needs . Provided disease specific education to patient  . Collaborated with appropriate clinical care team members regarding patient needs . Chart reviewed including recent office notes and lab results . Noted that patient is overdue for f/u with Dr Warrick Parisian . Talked with husband, Pilar Plate, by telephone. Patient has a memory disorder and is unable to answer screening questions on her own.  . Discussed social/family support o Daughter-in-law and son help when needed o Husband can drive locally and runs errands o Lives at home with husband . Reviewed medications . Discussed mobility, falls, and fall prevention o History of falls with wrist and shoulder fracture o Uses a walker for ambulation . Discussed ability to perform ADLs o Bathes and dresses herself o Cooks some but has difficulty due to memory  problem o Husband or family buys groceries . Discussed diet and appetite o Significant loss of appetite o Obvious weight loss but unsure of how much . Discussed dental health and that she is waiting to get in with a dentist . Recommended palliative care services and potentially hospice services . Spoke with daughter-in-law, Manuela Schwartz, about need for appt with Dr Warrick Parisian and about recommendation for palliative care evaluation . Will collaborate with [facility] clinical staff to have them contact daughter-in-law, Jasey Cortez, to schedule an appointment with Dr Warrick Parisian . Will collaborate with Dr Dettinger prior to that appt regarding palliative care order . Encouraged patient/family to reach out to PCP as needed . Encouraged patient/family to reach out to [facility] as needed  Patient Self Care Activities related to HTN, HLD, osteoporosis, frailty syndrome in geriatric patient, vascular dementia:  . Patient is unable to independently self-manage chronic health conditions  Initial goal documentation        Ms. Bosso was given information about Chronic Care Management services today including:  1. CCM service includes personalized support from designated clinical staff supervised by her physician, including individualized plan of care and coordination with other care providers 2. 24/7 contact phone numbers for assistance for urgent and routine care needs. 3. Service will only be billed when office clinical staff spend 20 minutes or more in a month to coordinate care. 4. Only one practitioner may furnish and bill the service in a calendar month. 5. The patient may stop CCM services at any time (effective at the end of the month) by phone call to the office staff. 6. The patient will be responsible for cost sharing (co-pay) of up to  20% of the service fee (after annual deductible is met).  Patient agreed to services and verbal consent obtained.   Patient verbalizes understanding of  instructions provided today.   Plan:   The care management team will reach out to the patient again over the next 30 days.  Lessie Dings will be contacted to schedule patient a f/u appt with Dr Dettinger  Chong Sicilian, BSN, RN-BC [place] [facility] [place] Management Direct Dial: [phone]

## 2020-11-07 NOTE — Telephone Encounter (Signed)
They can be scheduled on my walk-in day for memory evaluation and weight loss but I would go ahead and schedule their chronic follow-ups for a future date and we can deal with that later but at least we can get her evaluated and do memory testing and get referrals placed for hospice or palliative care.

## 2020-11-07 NOTE — Telephone Encounter (Signed)
Appointment scheduled.

## 2020-11-10 DIAGNOSIS — H2513 Age-related nuclear cataract, bilateral: Secondary | ICD-10-CM | POA: Diagnosis not present

## 2020-11-10 DIAGNOSIS — H40033 Anatomical narrow angle, bilateral: Secondary | ICD-10-CM | POA: Diagnosis not present

## 2020-11-21 ENCOUNTER — Ambulatory Visit: Payer: Medicare Other | Admitting: Family Medicine

## 2021-01-03 ENCOUNTER — Ambulatory Visit (INDEPENDENT_AMBULATORY_CARE_PROVIDER_SITE_OTHER): Payer: Medicare Other | Admitting: Family Medicine

## 2021-01-03 ENCOUNTER — Encounter: Payer: Self-pay | Admitting: Family Medicine

## 2021-01-03 ENCOUNTER — Other Ambulatory Visit: Payer: Self-pay

## 2021-01-03 VITALS — BP 134/79 | HR 78 | Ht 62.0 in | Wt 124.0 lb

## 2021-01-03 DIAGNOSIS — G301 Alzheimer's disease with late onset: Secondary | ICD-10-CM

## 2021-01-03 DIAGNOSIS — Z23 Encounter for immunization: Secondary | ICD-10-CM

## 2021-01-03 DIAGNOSIS — F0280 Dementia in other diseases classified elsewhere without behavioral disturbance: Secondary | ICD-10-CM | POA: Diagnosis not present

## 2021-01-03 NOTE — Progress Notes (Addendum)
BP 134/79   Pulse 78   Ht 5\' 2"  (1.575 m)   Wt 124 lb (56.2 kg)   SpO2 94%   BMI 22.68 kg/m    Subjective:   Patient ID: , female    DOB: 1928-02-03, 87 y.o.   MRN: [id number]  HPI: Melissa Hall is a 87 y.o. female presenting on 01/03/2021 for Memory Loss   HPI Worsening memory Patient is having worsening memory.  She is starting to forget almost all short-term things and will asked the same question on multiple occasions.  She still remembers long-term things but is definitely worsening.  They are living along with her husband and her husband is having some memory issues as well and daughter-in-law here with him today is very concerned about them living alone.  She lives 2 minutes away and checks on them regularly but is wondering what else needs to be done at this point. MMSE - Mini Mental State Exam 01/03/2021 01/03/2021 10/13/2017  Orientation to time 1 1 3   Orientation to Place 1 1 5   Registration 3 3 3   Attention/ Calculation 5 5 4   Recall 0 0 0  Language- name 2 objects 2 2 2   Language- repeat 0 0 1  Language- follow 3 step command 3 3 2   Language- read & follow direction 0 0 1  Write a sentence 1 1 1   Copy design 0 0 0  Total score 16 16 22      Relevant past medical, surgical, family and social history reviewed and updated as indicated. Interim medical history since our last visit reviewed. Allergies and medications reviewed and updated.  Review of Systems  Constitutional: Negative for chills and fever.  Eyes: Negative for visual disturbance.  Respiratory: Negative for chest tightness and shortness of breath.   Cardiovascular: Negative for chest pain and leg swelling.  Skin: Negative for rash.  Neurological: Negative for light-headedness and headaches.  Psychiatric/Behavioral: Positive for confusion. Negative for agitation, behavioral problems, dysphoric mood, self-injury, sleep disturbance and suicidal ideas. The patient is not nervous/anxious.    All other systems reviewed and are negative.   Per HPI unless specifically indicated above   Allergies as of 01/03/2021      Reactions   Keflex [cephalexin] Rash      Medication List       Accurate as of January 03, 2021 10:47 AM. If you have any questions, ask your nurse or doctor.        ASPIRIN 81 PO aspirin 81 mg tablet,delayed release   81 mg by oral route.   calcium citrate-vitamin D 315-200 MG-UNIT tablet Commonly known as: CITRACAL+D Take 1 tablet by mouth daily.   cholecalciferol 1000 units tablet Commonly known as: VITAMIN D Take 2,000 Units by mouth daily.   FISH OIL PO Take by mouth.   hydrochlorothiazide 12.5 MG capsule Commonly known as: MICROZIDE Take 1 capsule (12.5 mg total) by mouth daily.   multivitamin with minerals tablet Take 1 tablet by mouth daily.   pravastatin 10 MG tablet Commonly known as: PRAVACHOL Take 1 tablet (10 mg total) by mouth daily.        Objective:   BP 134/79   Pulse 78   Ht 5\' 2"  (1.575 m)   Wt 124 lb (56.2 kg)   SpO2 94%   BMI 22.68 kg/m   Wt Readings from Last 3 Encounters:  01/03/21 124 lb (56.2 kg)  09/08/19 125 lb 6.4 oz (56.9 kg)  06/28/19 128  lb (58.1 kg)    Physical Exam Vitals and nursing note reviewed.  Constitutional:      General: She is not in acute distress.    Appearance: She is well-developed and well-nourished. She is not diaphoretic.  Eyes:     Extraocular Movements: EOM normal.     Conjunctiva/sclera: Conjunctivae normal.  Cardiovascular:     Pulses: Intact distal pulses.  Musculoskeletal:        General: No edema.  Skin:    General: Skin is warm and dry.     Findings: No rash.  Neurological:     Mental Status: She is alert and oriented to person, place, and time.     Coordination: Coordination normal.  Psychiatric:        Mood and Affect: Mood and affect normal.        Behavior: Behavior normal.       Assessment & Plan:   Problem List Items Addressed This Visit    None   Visit Diagnoses    Need for immunization against influenza    -  Primary   Relevant Orders   Flu Vaccine QUAD High Dose(Fluad) (Completed)   Late onset Alzheimer's dementia without behavioral disturbance (HCC)          We will go ahead and stop cholesterol medicine and blood pressure medicine, blood pressure looks good today and patient does not like taking medicines.  Offered medicine for dementia and patient refused.  Recommended that she start using a life alert button and we agreed that she will and agreed that she focus on increasing her appetite and taking the food that her daughter-in-law brings. Follow up plan: Return in about 6 months (around 07/03/2021), or if symptoms worsen or fail to improve.  Counseling provided for all of the vaccine components Orders Placed This Encounter  Procedures  . Flu Vaccine QUAD High Dose(Fluad)    Arville Care, MD [facility] 01/03/2021, 10:47 AM

## 2021-04-02 ENCOUNTER — Other Ambulatory Visit: Payer: Self-pay | Admitting: Family Medicine

## 2021-04-02 DIAGNOSIS — E782 Mixed hyperlipidemia: Secondary | ICD-10-CM

## 2021-05-09 ENCOUNTER — Telehealth: Payer: Self-pay | Admitting: Family Medicine

## 2021-05-09 NOTE — Telephone Encounter (Signed)
Delford Field (NP with [employer]) called stating that she had a visit with patient and her husband today and recommended pt be seen by Dr Dettinger for further cognition testing regardng memory/dementia because pt had a hard time passing some of the tests given today and seemed confused.  Can contact Ty at [phone] for questions if needed.

## 2021-05-10 NOTE — Telephone Encounter (Signed)
Tried to schedule with patients daughter but daughter refused. Said pt has already been checked for memory/dementia. Will wait to bring patient to appt on 07/04/21 for 6 month check up. 

## 2021-05-10 NOTE — Telephone Encounter (Signed)
Ok, go ahead and schedule appt for patient

## 2021-05-11 ENCOUNTER — Other Ambulatory Visit: Payer: Self-pay | Admitting: Family Medicine

## 2021-05-11 DIAGNOSIS — E782 Mixed hyperlipidemia: Secondary | ICD-10-CM

## 2021-05-11 DIAGNOSIS — I10 Essential (primary) hypertension: Secondary | ICD-10-CM

## 2021-07-04 ENCOUNTER — Ambulatory Visit (INDEPENDENT_AMBULATORY_CARE_PROVIDER_SITE_OTHER): Payer: Medicare Other | Admitting: Family Medicine

## 2021-07-04 ENCOUNTER — Encounter: Payer: Self-pay | Admitting: Family Medicine

## 2021-07-04 ENCOUNTER — Other Ambulatory Visit: Payer: Self-pay

## 2021-07-04 VITALS — BP 127/70 | HR 73 | Ht 62.0 in | Wt 125.0 lb

## 2021-07-04 DIAGNOSIS — I10 Essential (primary) hypertension: Secondary | ICD-10-CM

## 2021-07-04 DIAGNOSIS — E782 Mixed hyperlipidemia: Secondary | ICD-10-CM | POA: Diagnosis not present

## 2021-07-04 DIAGNOSIS — F0281 Dementia in other diseases classified elsewhere with behavioral disturbance: Secondary | ICD-10-CM | POA: Diagnosis not present

## 2021-07-04 DIAGNOSIS — G301 Alzheimer's disease with late onset: Secondary | ICD-10-CM

## 2021-07-04 MED ORDER — PRAVASTATIN SODIUM 10 MG PO TABS: 10.0000 mg | ORAL_TABLET | Freq: Every day | ORAL | 3 refills | Status: DC

## 2021-07-04 MED ORDER — SERTRALINE HCL 50 MG PO TABS
50.0000 mg | ORAL_TABLET | Freq: Every day | ORAL | 3 refills | Status: DC
Start: 2021-07-04 — End: 2022-01-20

## 2021-07-04 MED ORDER — HYDROCHLOROTHIAZIDE 12.5 MG PO CAPS: 12.5000 mg | ORAL_CAPSULE | Freq: Every day | ORAL | 3 refills | Status: DC

## 2021-07-04 NOTE — Progress Notes (Signed)
BP 127/70   Pulse 73   Ht 5\' 2"  (1.575 m)   Wt 125 lb (56.7 kg)   SpO2 98%   BMI 22.86 kg/m    Subjective:   Patient ID: , female    DOB: [birth year], 87 y.o.   MRN: [id number]  HPI: Melissa Hall is a 87 y.o. female presenting on 07/04/2021 for Medical Management of Chronic Issues and Hypertension   HPI Dementia with behavioral disturbance Memory is worsening irritability and agitation is worsening more significantly.  Her MMSE is 19 today.  States that she has been having outbursts anger and throwing things and sundowning quite frequently.  She lives with her husband and this has been affecting him significantly.  Her daughter-in-law is here with her and she is the one that helps caretaker for them.  Hypertension Patient is currently on hydrochlorothiazide, and their blood pressure today is 127/70. Patient denies any lightheadedness or dizziness. Patient denies headaches, blurred vision, chest pains, shortness of breath, or weakness. Denies any side effects from medication and is content with current medication.  She mainly takes hydrochlorothiazide for swelling in her legs which does help with.  Hyperlipidemia Patient is coming in for recheck of his hyperlipidemia. The patient is currently taking pravastatin. They deny any issues with myalgias or history of liver damage from it. They deny any focal numbness or weakness or chest pain.   Relevant past medical, surgical, family and social history reviewed and updated as indicated. Interim medical history since our last visit reviewed. Allergies and medications reviewed and updated.  Review of Systems  Constitutional:  Negative for chills and fever.  Respiratory:  Negative for chest tightness and shortness of breath.   Cardiovascular:  Negative for chest pain and leg swelling.  Musculoskeletal:  Positive for gait problem (Uses walker).  Skin:  Negative for rash.  Neurological:  Negative for light-headedness and  headaches.  Psychiatric/Behavioral:  Positive for agitation, confusion and dysphoric mood. Negative for behavioral problems, self-injury, sleep disturbance and suicidal ideas. The patient is nervous/anxious.   All other systems reviewed and are negative.  Per HPI unless specifically indicated above   Allergies as of 07/04/2021       Reactions   Keflex [cephalexin] Rash        Medication List        Accurate as of July 04, 2021 11:39 AM. If you have any questions, ask your nurse or doctor.          ASPIRIN 81 PO aspirin 81 mg tablet,delayed release   81 mg by oral route.   calcium citrate-vitamin D 315-200 MG-UNIT tablet Commonly known as: CITRACAL+D Take 1 tablet by mouth daily.   cholecalciferol 1000 units tablet Commonly known as: VITAMIN D Take 2,000 Units by mouth daily.   FISH OIL PO Take by mouth.   hydrochlorothiazide 12.5 MG capsule Commonly known as: MICROZIDE Take 1 capsule (12.5 mg total) by mouth daily. What changed: See the new instructions. Changed by: July 06, 2021 Zula Hovsepian, MD   multivitamin with minerals tablet Take 1 tablet by mouth daily.   pravastatin 10 MG tablet Commonly known as: PRAVACHOL Take 1 tablet (10 mg total) by mouth daily.   sertraline 50 MG tablet Commonly known as: Zoloft Take 1 tablet (50 mg total) by mouth daily. Started by: Elige Radon Lorey Pallett, MD         Objective:   BP 127/70   Pulse 73   Ht 5\' 2"  (1.575 m)  Wt 125 lb (56.7 kg)   SpO2 98%   BMI 22.86 kg/m   Wt Readings from Last 3 Encounters:  07/04/21 125 lb (56.7 kg)  01/03/21 124 lb (56.2 kg)  09/08/19 125 lb 6.4 oz (56.9 kg)    Physical Exam Vitals and nursing note reviewed.  Constitutional:      General: She is not in acute distress.    Appearance: She is well-developed. She is not diaphoretic.  Eyes:     Conjunctiva/sclera: Conjunctivae normal.  Cardiovascular:     Rate and Rhythm: Normal rate and regular rhythm.     Heart sounds: Normal  heart sounds. No murmur heard. Pulmonary:     Effort: Pulmonary effort is normal. No respiratory distress.     Breath sounds: Normal breath sounds. No wheezing.  Musculoskeletal:        General: No tenderness. Normal range of motion.  Skin:    General: Skin is warm and dry.     Findings: No rash.  Neurological:     Mental Status: She is alert and oriented to person, place, and time.     Coordination: Coordination normal.  Psychiatric:        Behavior: Behavior normal.      Assessment & Plan:   Problem List Items Addressed This Visit       Cardiovascular and Mediastinum   Hypertension   Relevant Medications   pravastatin (PRAVACHOL) 10 MG tablet   hydrochlorothiazide (MICROZIDE) 12.5 MG capsule     Nervous and Auditory   Dementia with behavioral disturbance (HCC) - Primary   Relevant Medications   sertraline (ZOLOFT) 50 MG tablet     Other   Hyperlipidemia   Relevant Medications   pravastatin (PRAVACHOL) 10 MG tablet   hydrochlorothiazide (MICROZIDE) 12.5 MG capsule   Other Visit Diagnoses     Essential hypertension       Relevant Medications   pravastatin (PRAVACHOL) 10 MG tablet   hydrochlorothiazide (MICROZIDE) 12.5 MG capsule       Continue current medication except we will add Zoloft for behavioral disturbance to see if it helps. Follow up plan: Return in about 6 months (around 01/04/2022), or if symptoms worsen or fail to improve, for Hypertension and dementia.  Counseling provided for all of the vaccine components No orders of the defined types were placed in this encounter.   Arville Care, MD [facility] 07/04/2021, 11:39 AM

## 2021-09-04 ENCOUNTER — Ambulatory Visit (INDEPENDENT_AMBULATORY_CARE_PROVIDER_SITE_OTHER): Payer: Medicare Other

## 2021-09-04 ENCOUNTER — Other Ambulatory Visit: Payer: Self-pay

## 2021-09-04 ENCOUNTER — Ambulatory Visit (INDEPENDENT_AMBULATORY_CARE_PROVIDER_SITE_OTHER): Payer: Medicare Other | Admitting: Nurse Practitioner

## 2021-09-04 ENCOUNTER — Encounter: Payer: Self-pay | Admitting: Nurse Practitioner

## 2021-09-04 VITALS — BP 138/73 | HR 65 | Temp 98.1°F

## 2021-09-04 DIAGNOSIS — R0781 Pleurodynia: Secondary | ICD-10-CM

## 2021-09-04 DIAGNOSIS — W19XXXA Unspecified fall, initial encounter: Secondary | ICD-10-CM

## 2021-09-04 DIAGNOSIS — S2241XA Multiple fractures of ribs, right side, initial encounter for closed fracture: Secondary | ICD-10-CM | POA: Diagnosis not present

## 2021-09-04 MED ORDER — DICLOFENAC SODIUM 1 % EX GEL
2.0000 g | Freq: Four times a day (QID) | CUTANEOUS | 1 refills | Status: DC
Start: 2021-09-04 — End: 2022-02-27

## 2021-09-04 NOTE — Patient Instructions (Signed)
Chest Wall Pain °Chest wall pain is pain in or around the bones and muscles of your chest. Chest wall pain may be caused by: °An injury. °Coughing a lot. °Using your chest and arm muscles too much. °Sometimes, the cause may not be known. This pain may take a few weeks or longer to get better. °Follow these instructions at home: °Managing pain, stiffness, and swelling °If told, put ice on the painful area: °Put ice in a plastic bag. °Place a towel between your skin and the bag. °Leave the ice on for 20 minutes, 2-3 times a day. ° °Activity °Rest as told by your doctor. °Avoid doing things that cause pain. This includes lifting heavy items. °Ask your doctor what activities are safe for you. °General instructions ° °Take over-the-counter and prescription medicines only as told by your doctor. °Do not use any products that contain nicotine or tobacco, such as cigarettes, e-cigarettes, and chewing tobacco. If you need help quitting, ask your doctor. °Keep all follow-up visits as told by your doctor. This is important. °Contact a doctor if: °You have a fever. °Your chest pain gets worse. °You have new symptoms. °Get help right away if: °You feel sick to your stomach (nauseous) or you throw up (vomit). °You feel sweaty or light-headed. °You have a cough with mucus from your lungs (sputum) or you cough up blood. °You are short of breath. °These symptoms may be an emergency. Do not wait to see if the symptoms will go away. Get medical help right away. Call your local emergency services (911 in the [place] Do not drive yourself to the hospital. °Summary °Chest wall pain is pain in or around the bones and muscles of your chest. °It may be treated with ice, rest, and medicines. Your condition may also get better if you avoid doing things that cause pain. °Contact a doctor if you have a fever, chest pain that gets worse, or new symptoms. °Get help right away if you feel light-headed or you get short of breath. These symptoms may  be an emergency. °This information is not intended to replace advice given to you by your health care provider. Make sure you discuss any questions you have with your health care provider. °Document Revised: 03/12/2021 Document Reviewed: 02/22/2021 °Elsevier Patient Education © 2022 [employer] ° °

## 2021-09-05 ENCOUNTER — Telehealth: Payer: Self-pay | Admitting: Family Medicine

## 2021-09-05 DIAGNOSIS — W19XXXA Unspecified fall, initial encounter: Secondary | ICD-10-CM | POA: Insufficient documentation

## 2021-09-05 DIAGNOSIS — R0781 Pleurodynia: Secondary | ICD-10-CM | POA: Insufficient documentation

## 2021-09-05 NOTE — Telephone Encounter (Signed)
It looks like she has a rib fracture on the right 8th rib, recommend breathing exercises to make sure she is deep breathing and can use ice and heat to help as well.

## 2021-09-05 NOTE — Assessment & Plan Note (Signed)
Education provided on fall prevention.

## 2021-09-05 NOTE — Telephone Encounter (Signed)
Patient had acute visit with Je yesterday - report is in chart can you please review and advise

## 2021-09-05 NOTE — Telephone Encounter (Signed)
Daughter made aware and understood.

## 2021-09-05 NOTE — Assessment & Plan Note (Signed)
Fall in the last 6-12 hours, uncontrolled right rib/flank pain. Voltaren topical gel as needed, ice/ warm compress. Brace for support, completed x-ray to rule out rib fracture, results pending.   Education provided to patient with printed hand outs given.   OI

## 2021-09-05 NOTE — Progress Notes (Signed)
Acute Office Visit  Subjective:    Patient ID: Melissa Hall, female    DOB: 1928/11/25, 87 y.o.   MRN: [id number]  Chief Complaint  Patient presents with   Fall   pain in rib    Fall  Patient is in today for Pain  She reports new onset right rib/flank  pain. was an injury that may have caused the pain. The pain started today and is staying constant. The pain does not radiate . The pain is described as aching, is mild in intensity, occurring intermittently. Symptoms are worse in the: mid-day, afternoon  Aggravating factors: bending backwards, bending forwards, and bending sideways Relieving factors: none.  She has tried application of ice with little relief.    Past Medical History:  Diagnosis Date   Arthritis    Cataract    GERD (gastroesophageal reflux disease)    High cholesterol    Hypertension    Osteoporosis     Past Surgical History:  Procedure Laterality Date   ABDOMINAL HYSTERECTOMY     APPENDECTOMY     BACK SURGERY     CATARACT EXTRACTION W/PHACO  10/18/2012   Procedure: CATARACT EXTRACTION PHACO AND INTRAOCULAR LENS PLACEMENT (IOC);  Surgeon: Gemma Payor, MD;  Location: [facility];  Service: Ophthalmology;  Laterality: Left;  CDE:17.69   COLONOSCOPY     EYE SURGERY     JOINT REPLACEMENT     bilat   ORIF HUMERUS FRACTURE Left 05/12/2014   Procedure: OPEN REDUCTION INTERNAL FIXATION (ORIF) PROXIMAL HUMERUS FRACTURE;  Surgeon: Eldred Manges, MD;  Location: [facility];  Service: Orthopedics;  Laterality: Left;  Open Reduction Internal Fixation Left Proximal Humerus   TONSILLECTOMY      Family History  Problem Relation Age of Onset   Heart disease Mother        Died age 98   Osteoporosis Mother    Heart disease Father        Died in his 85s   Cancer Father    Stroke Father    Suicidality Brother    Suicidality Daughter     Social History   Socioeconomic History   Marital status: Married    Spouse name: Not on file   Number of children: Not on file    Years of education: 2   Highest education level: Associate degree: occupational, Scientist, product/process development, or vocational program  Occupational History   Not on file  Tobacco Use   Smoking status: Never   Smokeless tobacco: Never  Vaping Use   Vaping Use: Never used  Substance and Sexual Activity   Alcohol use: No   Drug use: No   Sexual activity: Not Currently  Other Topics Concern   Not on file  Social History Narrative   Lives at home with her husband of 66 years. They have two adult children. Their son lives locally and their daughter lives at [address]. They have several grandchildren. She is retired and is mostly sedentary. She does do some cooking.    Social Determinants of Health   Financial Resource Strain: Low Risk    Difficulty of Paying Living Expenses: Not hard at all  Food Insecurity: Not on file  Transportation Needs: No Transportation Needs   Lack of Transportation (Medical): No   Lack of Transportation (Non-Medical): No  Physical Activity: Inactive   Days of Exercise per Week: 0 days   Minutes of Exercise per Session: 0 min  Stress: Not on file  Social Connections: Not on file  Intimate Partner Violence: Not on file    Outpatient Medications Prior to Visit  Medication Sig Dispense Refill   ASPIRIN 81 PO aspirin 81 mg tablet,delayed release   81 mg by oral route.     calcium citrate-vitamin D (CITRACAL+D) 315-200 MG-UNIT per tablet Take 1 tablet by mouth daily.      cholecalciferol (VITAMIN D) 1000 UNITS tablet Take 2,000 Units by mouth daily.     hydrochlorothiazide (MICROZIDE) 12.5 MG capsule Take 1 capsule (12.5 mg total) by mouth daily. 90 capsule 3   Multiple Vitamins-Minerals (MULTIVITAMIN WITH MINERALS) tablet Take 1 tablet by mouth daily.     Omega-3 Fatty Acids (FISH OIL PO) Take by mouth.     pravastatin (PRAVACHOL) 10 MG tablet Take 1 tablet (10 mg total) by mouth daily. 90 tablet 3   sertraline (ZOLOFT) 50 MG tablet Take 1 tablet (50 mg total) by mouth daily.  90 tablet 3   No facility-administered medications prior to visit.    Allergies  Allergen Reactions   Keflex [Cephalexin] Rash    Review of Systems  Constitutional: Negative.   HENT: Negative.    Eyes: Negative.   Respiratory: Negative.    Cardiovascular: Negative.   Gastrointestinal: Negative.   Musculoskeletal:  Positive for myalgias.  Skin:  Negative for rash.  All other systems reviewed and are negative.     Objective:    Physical Exam Vitals and nursing note reviewed. Exam conducted with a chaperone present.  Constitutional:      Appearance: Normal appearance.  HENT:     Head: Normocephalic.     Nose: Nose normal.     Mouth/Throat:     Mouth: Mucous membranes are moist.     Pharynx: Oropharynx is clear.  Eyes:     Conjunctiva/sclera: Conjunctivae normal.  Cardiovascular:     Rate and Rhythm: Normal rate and regular rhythm.  Pulmonary:     Effort: Pulmonary effort is normal.     Breath sounds: Normal breath sounds.  Chest:     Chest wall: Tenderness present.  Abdominal:     General: Bowel sounds are normal.  Musculoskeletal:        General: Normal range of motion.  Skin:    Findings: No rash.  Neurological:     Mental Status: She is alert and oriented to person, place, and time.    BP 138/73   Pulse 65   Temp 98.1 F (36.7 C) (Temporal)  Wt Readings from Last 3 Encounters:  07/04/21 125 lb (56.7 kg)  01/03/21 124 lb (56.2 kg)  09/08/19 125 lb 6.4 oz (56.9 kg)    Health Maintenance Due  Topic Date Due   Zoster Vaccines- Shingrix (1 of 2) Never done   DEXA SCAN  01/31/2018   COVID-19 Vaccine (3 - Booster for Moderna series) 07/23/2020   INFLUENZA VACCINE  07/22/2021    There are no preventive care reminders to display for this patient.   Lab Results  Component Value Date   TSH 0.956 06/25/2016   Lab Results  Component Value Date   WBC 4.6 05/04/2019   HGB 13.3 05/04/2019   HCT 37.6 05/04/2019   MCV 93 05/04/2019   PLT 187  05/04/2019   Lab Results  Component Value Date   NA 144 09/08/2019   K 4.4 09/08/2019   CO2 28 09/08/2019   GLUCOSE 92 09/08/2019   BUN 11 09/08/2019   CREATININE 0.60 09/08/2019   BILITOT 0.6 05/04/2019   ALKPHOS 63 05/04/2019  AST 28 05/04/2019   ALT 16 05/04/2019   PROT 6.2 05/04/2019   ALBUMIN 4.0 05/04/2019   CALCIUM 9.9 09/08/2019   Lab Results  Component Value Date   CHOL 172 05/04/2019   Lab Results  Component Value Date   HDL 71 05/04/2019   Lab Results  Component Value Date   LDLCALC 88 05/04/2019   Lab Results  Component Value Date   TRIG 65 05/04/2019   Lab Results  Component Value Date   CHOLHDL 2.4 05/04/2019   Lab Results  Component Value Date   HGBA1C 5.6 04/15/2013       Assessment & Plan:   Problem List Items Addressed This Visit       Other   Pain in rib    Fall in the last 6-12 hours, uncontrolled right rib/flank pain. Voltaren topical gel as needed, ice/ warm compress. Brace for support, completed x-ray to rule out rib fracture, results pending.   Education provided to patient with printed hand outs given.   OI      Relevant Medications   diclofenac Sodium (VOLTAREN) 1 % GEL   Other Relevant Orders   DG Ribs Unilateral W/Chest Right   Fall - Primary    Education provided on fall prevention.        Relevant Orders   DG Ribs Unilateral W/Chest Right     Meds ordered this encounter  Medications   diclofenac Sodium (VOLTAREN) 1 % GEL    Sig: Apply 2 g topically 4 (four) times daily.    Dispense:  50 g    Refill:  1    Order Specific Question:   Supervising Provider    Answer:   Raliegh Ip [[id number]]     Daryll Drown, NP

## 2021-09-18 ENCOUNTER — Emergency Department (HOSPITAL_COMMUNITY)
Admission: EM | Admit: 2021-09-18 | Discharge: 2021-09-18 | Disposition: A | Discharge status: Home / Self Care | Payer: Medicare Other | Attending: Emergency Medicine | Admitting: Emergency Medicine

## 2021-09-18 ENCOUNTER — Other Ambulatory Visit: Payer: Self-pay

## 2021-09-18 ENCOUNTER — Encounter (HOSPITAL_COMMUNITY): Payer: Self-pay | Admitting: Emergency Medicine

## 2021-09-18 ENCOUNTER — Emergency Department (HOSPITAL_COMMUNITY): Payer: Medicare Other

## 2021-09-18 DIAGNOSIS — E876 Hypokalemia: Secondary | ICD-10-CM | POA: Diagnosis not present

## 2021-09-18 DIAGNOSIS — R2689 Other abnormalities of gait and mobility: Secondary | ICD-10-CM | POA: Insufficient documentation

## 2021-09-18 DIAGNOSIS — W19XXXA Unspecified fall, initial encounter: Secondary | ICD-10-CM | POA: Insufficient documentation

## 2021-09-18 DIAGNOSIS — S0003XA Contusion of scalp, initial encounter: Secondary | ICD-10-CM | POA: Insufficient documentation

## 2021-09-18 DIAGNOSIS — Z79899 Other long term (current) drug therapy: Secondary | ICD-10-CM | POA: Insufficient documentation

## 2021-09-18 DIAGNOSIS — R531 Weakness: Secondary | ICD-10-CM | POA: Diagnosis not present

## 2021-09-18 DIAGNOSIS — R2681 Unsteadiness on feet: Secondary | ICD-10-CM

## 2021-09-18 DIAGNOSIS — R269 Unspecified abnormalities of gait and mobility: Secondary | ICD-10-CM | POA: Diagnosis not present

## 2021-09-18 DIAGNOSIS — R404 Transient alteration of awareness: Secondary | ICD-10-CM | POA: Diagnosis not present

## 2021-09-18 DIAGNOSIS — Z043 Encounter for examination and observation following other accident: Secondary | ICD-10-CM | POA: Diagnosis not present

## 2021-09-18 DIAGNOSIS — Z7982 Long term (current) use of aspirin: Secondary | ICD-10-CM | POA: Diagnosis not present

## 2021-09-18 DIAGNOSIS — Z9660 Presence of unspecified orthopedic joint implant: Secondary | ICD-10-CM | POA: Insufficient documentation

## 2021-09-18 DIAGNOSIS — S0990XA Unspecified injury of head, initial encounter: Secondary | ICD-10-CM | POA: Diagnosis present

## 2021-09-18 DIAGNOSIS — I10 Essential (primary) hypertension: Secondary | ICD-10-CM | POA: Diagnosis not present

## 2021-09-18 DIAGNOSIS — I6782 Cerebral ischemia: Secondary | ICD-10-CM | POA: Diagnosis not present

## 2021-09-18 DIAGNOSIS — Z743 Need for continuous supervision: Secondary | ICD-10-CM | POA: Diagnosis not present

## 2021-09-18 DIAGNOSIS — I6529 Occlusion and stenosis of unspecified carotid artery: Secondary | ICD-10-CM | POA: Diagnosis not present

## 2021-09-18 DIAGNOSIS — G319 Degenerative disease of nervous system, unspecified: Secondary | ICD-10-CM | POA: Diagnosis not present

## 2021-09-18 LAB — CBC WITH DIFFERENTIAL/PLATELET
Abs Immature Granulocytes: 0.02 10*3/uL (ref 0.00–0.07)
Basophils Absolute: 0 10*3/uL (ref 0.0–0.1)
Basophils Relative: 0 %
Eosinophils Absolute: 0 10*3/uL (ref 0.0–0.5)
Eosinophils Relative: 0 %
HCT: 38.5 % (ref 36.0–46.0)
Hemoglobin: 12.3 g/dL (ref 12.0–15.0)
Immature Granulocytes: 0 %
Lymphocytes Relative: 10 %
Lymphs Abs: 0.8 10*3/uL (ref 0.7–4.0)
MCH: 31.4 pg (ref 26.0–34.0)
MCHC: 31.9 g/dL (ref 30.0–36.0)
MCV: 98.2 fL (ref 80.0–100.0)
Monocytes Absolute: 0.5 10*3/uL (ref 0.1–1.0)
Monocytes Relative: 7 %
Neutro Abs: 6.6 10*3/uL (ref 1.7–7.7)
Neutrophils Relative %: 83 %
Platelets: 181 10*3/uL (ref 150–400)
RBC: 3.92 MIL/uL (ref 3.87–5.11)
RDW: 13.2 % (ref 11.5–15.5)
WBC: 8 10*3/uL (ref 4.0–10.5)
nRBC: 0 % (ref 0.0–0.2)

## 2021-09-18 LAB — URINALYSIS, ROUTINE W REFLEX MICROSCOPIC
Bilirubin Urine: NEGATIVE
Glucose, UA: NEGATIVE mg/dL
Hgb urine dipstick: NEGATIVE
Ketones, ur: NEGATIVE mg/dL
Nitrite: NEGATIVE
Protein, ur: 30 mg/dL — AB
Specific Gravity, Urine: 1.011 (ref 1.005–1.030)
pH: 7 (ref 5.0–8.0)

## 2021-09-18 LAB — COMPREHENSIVE METABOLIC PANEL
ALT: 17 U/L (ref 0–44)
AST: 30 U/L (ref 15–41)
Albumin: 3.7 g/dL (ref 3.5–5.0)
Alkaline Phosphatase: 74 U/L (ref 38–126)
Anion gap: 7 (ref 5–15)
BUN: 14 mg/dL (ref 8–23)
CO2: 31 mmol/L (ref 22–32)
Calcium: 9.2 mg/dL (ref 8.9–10.3)
Chloride: 102 mmol/L (ref 98–111)
Creatinine, Ser: 0.58 mg/dL (ref 0.44–1.00)
GFR, Estimated: >60 mL/min (ref >60)
Glucose, Bld: 136 mg/dL — ABNORMAL HIGH (ref 70–99)
Potassium: 3 mmol/L — ABNORMAL LOW (ref 3.5–5.1)
Sodium: 140 mmol/L (ref 135–145)
Total Bilirubin: 0.4 mg/dL (ref 0.3–1.2)
Total Protein: 6.7 g/dL (ref 6.5–8.1)

## 2021-09-18 NOTE — ED Provider Notes (Signed)
[facility] EMERGENCY DEPARTMENT Provider Note  CSN: [id number] Arrival date & time: 09/18/21 1500    History Chief Complaint  Patient presents with  . Fall    Melissa Hall is a 87 y.o. female with history of dementia still lives at home independently with her husband. She was brought to the ED via EMS from home today but she is not sure why and denies any complaints. Her daughter in law at bedside reports she has been having trouble walking recently with several falls. She states that the patient will tell her to wait because her L leg isn't working right. She was unable to get out of the tub this morning because she couldn't lift that leg and the daughter had to help her down to the floor. She had a head injury during a fall several days ago and a rib fracture from a fall 2 weeks ago. The patient states she is 'just clumsy'. The daughter in law reports the patient is supposed to be using a walker but sometimes doesn't want to. She has also had decreased PO intake and has been getting Ensure supplements at times.    Past Medical History:  Diagnosis Date  . Arthritis   . Cataract   . GERD (gastroesophageal reflux disease)   . High cholesterol   . Hypertension   . Osteoporosis     Past Surgical History:  Procedure Laterality Date  . ABDOMINAL HYSTERECTOMY    . APPENDECTOMY    . BACK SURGERY    . CATARACT EXTRACTION W/PHACO  10/18/2012   Procedure: CATARACT EXTRACTION PHACO AND INTRAOCULAR LENS PLACEMENT (IOC);  Surgeon: Gemma Payor, MD;  Location: [facility];  Service: Ophthalmology;  Laterality: Left;  CDE:17.69  . COLONOSCOPY    . EYE SURGERY    . JOINT REPLACEMENT     bilat  . ORIF HUMERUS FRACTURE Left 05/12/2014   Procedure: OPEN REDUCTION INTERNAL FIXATION (ORIF) PROXIMAL HUMERUS FRACTURE;  Surgeon: Eldred Manges, MD;  Location: [facility];  Service: Orthopedics;  Laterality: Left;  Open Reduction Internal Fixation Left Proximal Humerus  . TONSILLECTOMY      Family History   Problem Relation Age of Onset  . Heart disease Mother        Died age 54  . Osteoporosis Mother   . Heart disease Father        Died in his 54s  . Cancer Father   . Stroke Father   . Suicidality Brother   . Suicidality Daughter     Social History   Tobacco Use  . Smoking status: Never  . Smokeless tobacco: Never  Vaping Use  . Vaping Use: Never used  Substance Use Topics  . Alcohol use: No  . Drug use: No     Home Medications Prior to Admission medications   Medication Sig Start Date End Date Taking? Authorizing Provider  ASPIRIN 81 PO aspirin 81 mg tablet,delayed release   81 mg by oral route.    [provider]  calcium citrate-vitamin D (CITRACAL+D) 315-200 MG-UNIT per tablet Take 1 tablet by mouth daily.     [provider]  cholecalciferol (VITAMIN D) 1000 UNITS tablet Take 2,000 Units by mouth daily.    [provider]  diclofenac Sodium (VOLTAREN) 1 % GEL Apply 2 g topically 4 (four) times daily. 09/04/21   Daryll Drown, NP  hydrochlorothiazide (MICROZIDE) 12.5 MG capsule Take 1 capsule (12.5 mg total) by mouth daily. 07/04/21   Dettinger, Elige Radon,  MD  Multiple Vitamins-Minerals (MULTIVITAMIN WITH MINERALS) tablet Take 1 tablet by mouth daily.    [provider]  Omega-3 Fatty Acids (FISH OIL PO) Take by mouth.    [provider]  pravastatin (PRAVACHOL) 10 MG tablet Take 1 tablet (10 mg total) by mouth daily. 07/04/21   Dettinger, Elige Radon, MD  sertraline (ZOLOFT) 50 MG tablet Take 1 tablet (50 mg total) by mouth daily. 07/04/21   Dettinger, Elige Radon, MD     Allergies    Keflex [cephalexin]   Review of Systems   Review of Systems A comprehensive review of systems was completed and negative except as noted in HPI.    Physical Exam Ht 5\' 2"  (1.575 m)   Wt 56.7 kg   BMI 22.86 kg/m   Physical Exam Vitals and nursing note reviewed.  Constitutional:      Appearance: Normal appearance.  HENT:     Head:  Normocephalic.     Comments: Contusion parietal scalp    Nose: Nose normal.     Mouth/Throat:     Mouth: Mucous membranes are moist.  Eyes:     Extraocular Movements: Extraocular movements intact.     Conjunctiva/sclera: Conjunctivae normal.  Cardiovascular:     Rate and Rhythm: Normal rate.  Pulmonary:     Effort: Pulmonary effort is normal.     Breath sounds: Normal breath sounds.  Abdominal:     General: Abdomen is flat.     Palpations: Abdomen is soft.     Tenderness: There is no abdominal tenderness.  Musculoskeletal:        General: No swelling. Normal range of motion.     Cervical back: Neck supple.  Skin:    General: Skin is warm and dry.  Neurological:     General: No focal deficit present.     Mental Status: She is alert.     Cranial Nerves: No cranial nerve deficit.     Sensory: No sensory deficit.     Motor: No weakness (moves all four extremities, normals strength prox and distal for BLE).  Psychiatric:        Mood and Affect: Mood normal.     ED Results / Procedures / Treatments   Labs (all labs ordered are listed, but only abnormal results are displayed) Labs Reviewed  COMPREHENSIVE METABOLIC PANEL  CBC WITH DIFFERENTIAL/PLATELET  URINALYSIS, ROUTINE W REFLEX MICROSCOPIC    EKG None  Radiology No results found.  Procedures Procedures  Medications Ordered in the ED Medications - No data to display   MDM Rules/Calculators/A&P MDM Patient with reported difficulty with gait at home including L leg that sometimes works and sometimes doesn't. She has a normal neuro exam now and no complaints. Will check labs, CT head for recent head injury and reassess.    ED Course  I have reviewed the triage vital signs and the nursing notes.  Pertinent labs & imaging results that were available during my care of the patient were reviewed by me and considered in my medical decision making (see chart for details).  Clinical Course as of 09/18/21 2212  Wed Sep 18, 2021  1622 CBC is normal.  [CS]  1638 CT is neg for acute process.  [CS]  1655 CMP with mild hypokalemia.  [CS]  1836 UA without definite signs of infection, denies urinary symptoms. She has been able to walk to the bathroom with a steady gait. She is eager to go home. Recommend close PCP follow up  for further evaluation including consideration of neurology referral. She was encouraged to use her walker and avoid doing activities such as yard work which may put her at risk for further falls. RTED for any other concerns.  [CS]    Clinical Course User Index [CS] Pollyann Savoy, MD    Final Clinical Impression(s) / ED Diagnoses Final diagnoses:  None    Rx / DC Orders ED Discharge Orders     None        Pollyann Savoy, MD 09/18/21 2212

## 2021-09-18 NOTE — ED Triage Notes (Signed)
Pt to the ED with 3 falls today. EMS reports she did not loose consciousness and does not take blood thinners.  Hx of Dementia. VSS no obvious deformities.  Pt has an increasingly unstable gait for the past month.

## 2021-09-20 ENCOUNTER — Emergency Department (HOSPITAL_COMMUNITY)
Admission: EM | Admit: 2021-09-20 | Discharge: 2021-09-20 | Disposition: A | Discharge status: Home / Self Care | Payer: Medicare Other | Attending: Emergency Medicine | Admitting: Emergency Medicine

## 2021-09-20 ENCOUNTER — Other Ambulatory Visit: Payer: Self-pay

## 2021-09-20 ENCOUNTER — Encounter (HOSPITAL_COMMUNITY): Payer: Self-pay

## 2021-09-20 ENCOUNTER — Emergency Department (HOSPITAL_COMMUNITY): Payer: Medicare Other

## 2021-09-20 DIAGNOSIS — I10 Essential (primary) hypertension: Secondary | ICD-10-CM | POA: Insufficient documentation

## 2021-09-20 DIAGNOSIS — Z743 Need for continuous supervision: Secondary | ICD-10-CM | POA: Diagnosis not present

## 2021-09-20 DIAGNOSIS — S0101XA Laceration without foreign body of scalp, initial encounter: Secondary | ICD-10-CM | POA: Insufficient documentation

## 2021-09-20 DIAGNOSIS — W19XXXA Unspecified fall, initial encounter: Secondary | ICD-10-CM | POA: Diagnosis not present

## 2021-09-20 DIAGNOSIS — R0902 Hypoxemia: Secondary | ICD-10-CM | POA: Diagnosis not present

## 2021-09-20 DIAGNOSIS — M2578 Osteophyte, vertebrae: Secondary | ICD-10-CM | POA: Diagnosis not present

## 2021-09-20 DIAGNOSIS — R9082 White matter disease, unspecified: Secondary | ICD-10-CM | POA: Diagnosis not present

## 2021-09-20 DIAGNOSIS — R296 Repeated falls: Secondary | ICD-10-CM | POA: Diagnosis not present

## 2021-09-20 DIAGNOSIS — S0181XA Laceration without foreign body of other part of head, initial encounter: Secondary | ICD-10-CM | POA: Diagnosis not present

## 2021-09-20 MED ORDER — LIDOCAINE HCL (PF) 1 % IJ SOLN: 10.0000 mL | Freq: Once | INTRAMUSCULAR | Status: DC

## 2021-09-20 NOTE — ED Provider Notes (Signed)
[facility] EMERGENCY DEPARTMENT Provider Note   CSN: [id number] Arrival date & time: 09/20/21  1313     History Chief Complaint  Patient presents with   Head Laceration    Melissa Hall is a 87 y.o. female with history of dementia still lives at home independently with her husband.Hx is gathered by phone call with the patient's husband Jeidi Gilles.  Mr. Bjelland states that he went outside to bring the garbage cans and when he got back his wife had blood on her head and was trying to clean up the floor where she had fallen.  He states he did not witness her fall but is that she has had multiple falls recently at home.  The patient was seen for gait instability 2 days ago.  He states that she has had 4 falls since then.  He states that she uses a walker but sometimes forgets to use it and thinks that may be what happened today.  She was not acting abnormally, had no vomiting, severe headache or change in mental status.  She is up-to-date on her tetanus vaccine   Head Laceration Pertinent negatives include no headaches.      Past Medical History:  Diagnosis Date   Arthritis    Cataract    GERD (gastroesophageal reflux disease)    High cholesterol    Hypertension    Osteoporosis     Patient Active Problem List   Diagnosis Date Noted   Pain in rib 09/05/2021   Fall 09/05/2021   Dementia with behavioral disturbance (HCC) 09/08/2019   Frailty syndrome in geriatric patient 05/06/2019   Fracture of proximal end of left humerus 05/13/2014   Traumatic closed displaced fracture of proximal end of left humerus 05/12/2014    Class: Acute   Osteoporosis, post-menopausal 01/25/2014   Hyperlipidemia 12/14/2013   Hypertension 12/14/2013   Vitamin D deficiency 12/14/2013   Scoliosis (and kyphoscoliosis), idiopathic 12/14/2013    Past Surgical History:  Procedure Laterality Date   ABDOMINAL HYSTERECTOMY     APPENDECTOMY     BACK SURGERY     CATARACT EXTRACTION W/PHACO   10/18/2012   Procedure: CATARACT EXTRACTION PHACO AND INTRAOCULAR LENS PLACEMENT (IOC);  Surgeon: Gemma Payor, MD;  Location: [facility];  Service: Ophthalmology;  Laterality: Left;  CDE:17.69   COLONOSCOPY     EYE SURGERY     JOINT REPLACEMENT     bilat   ORIF HUMERUS FRACTURE Left 05/12/2014   Procedure: OPEN REDUCTION INTERNAL FIXATION (ORIF) PROXIMAL HUMERUS FRACTURE;  Surgeon: Eldred Manges, MD;  Location: [facility];  Service: Orthopedics;  Laterality: Left;  Open Reduction Internal Fixation Left Proximal Humerus   TONSILLECTOMY       OB History   No obstetric history on file.     Family History  Problem Relation Age of Onset   Heart disease Mother        Died age 54   Osteoporosis Mother    Heart disease Father        Died in his 78s   Cancer Father    Stroke Father    Suicidality Brother    Suicidality Daughter     Social History   Tobacco Use   Smoking status: Never   Smokeless tobacco: Never  Vaping Use   Vaping Use: Never used  Substance Use Topics   Alcohol use: No   Drug use: No    Home Medications Prior to Admission medications   Medication Sig Start Date End  Date Taking? Authorizing Provider  ASPIRIN 81 PO aspirin 81 mg tablet,delayed release   81 mg by oral route.    [provider]  calcium citrate-vitamin D (CITRACAL+D) 315-200 MG-UNIT per tablet Take 1 tablet by mouth daily.     [provider]  cholecalciferol (VITAMIN D) 1000 UNITS tablet Take 2,000 Units by mouth daily.    [provider]  diclofenac Sodium (VOLTAREN) 1 % GEL Apply 2 g topically 4 (four) times daily. 09/04/21   Daryll Drown, NP  hydrochlorothiazide (MICROZIDE) 12.5 MG capsule Take 1 capsule (12.5 mg total) by mouth daily. 07/04/21   Dettinger, Elige Radon, MD  Multiple Vitamins-Minerals (MULTIVITAMIN WITH MINERALS) tablet Take 1 tablet by mouth daily.    [provider]  Omega-3 Fatty Acids (FISH OIL PO) Take by mouth.    [provider]   pravastatin (PRAVACHOL) 10 MG tablet Take 1 tablet (10 mg total) by mouth daily. 07/04/21   Dettinger, Elige Radon, MD  sertraline (ZOLOFT) 50 MG tablet Take 1 tablet (50 mg total) by mouth daily. 07/04/21   Dettinger, Elige Radon, MD    Allergies    Keflex [cephalexin]  Review of Systems   Review of Systems  Musculoskeletal:  Negative for neck pain.  Skin:  Positive for wound.  Neurological:  Negative for syncope and headaches.   Physical Exam Updated Vital Signs BP (!) 153/86 (BP Location: Left Arm)   Pulse 78   Temp 98.2 F (36.8 C) (Oral)   Resp (!) 21   Ht 5\' 2"  (1.575 m)   Wt 56.7 kg   SpO2 99%   BMI 22.86 kg/m   Physical Exam Vitals and nursing note reviewed.  Constitutional:      General: She is not in acute distress.    Appearance: She is well-developed. She is not diaphoretic.  HENT:     Head: Normocephalic.     Comments: Laceration to the left occiput    Right Ear: External ear normal.     Left Ear: External ear normal.     Nose: Nose normal.     Mouth/Throat:     Mouth: Mucous membranes are moist.  Eyes:     General: No scleral icterus.    Conjunctiva/sclera: Conjunctivae normal.  Cardiovascular:     Rate and Rhythm: Normal rate and regular rhythm.     Heart sounds: Normal heart sounds. No murmur heard.   No friction rub. No gallop.  Pulmonary:     Effort: Pulmonary effort is normal. No respiratory distress.     Breath sounds: Normal breath sounds.  Abdominal:     General: Bowel sounds are normal. There is no distension.     Palpations: Abdomen is soft. There is no mass.     Tenderness: There is no abdominal tenderness. There is no guarding.  Musculoskeletal:     Cervical back: Normal range of motion.  Skin:    General: Skin is warm and dry.  Neurological:     General: No focal deficit present.     Mental Status: She is alert. Mental status is at baseline.     GCS: GCS eye subscore is 4. GCS verbal subscore is 4. GCS motor subscore is 6.     Cranial  Nerves: Cranial nerves are intact.     Sensory: No sensory deficit.     Motor: Motor function is intact.  Psychiatric:        Behavior: Behavior normal.    ED Results / Procedures /  Treatments   Labs (all labs ordered are listed, but only abnormal results are displayed) Labs Reviewed - No data to display  EKG None  Radiology CT Head Wo Contrast  Result Date: 09/18/2021 CLINICAL DATA:  Multiple fall EXAM: CT HEAD WITHOUT CONTRAST TECHNIQUE: Contiguous axial images were obtained from the base of the skull through the vertex without intravenous contrast. COMPARISON:  MRI 04/27/2013 FINDINGS: Brain: No acute territorial infarction, hemorrhage or intracranial. Moderate atrophy with prominent temporal lobe atrophy. Moderate white matter hypodensity consistent with chronic small vessel ischemic change. Stable ventricle size. Vascular: Hyperdense vessels.  Carotid vascular calcification Skull: Normal. Negative for fracture or focal lesion. Sinuses/Orbits: No acute finding. Other: None IMPRESSION: 1. No CT evidence for acute intracranial abnormality. 2. Atrophy and chronic small vessel ischemic changes of the white. Electronically Signed   By: Jasmine Pang M.D.   On: 09/18/2021 16:30    Procedures .Marland KitchenLaceration Repair  Date/Time: 09/20/2021 4:31 PM Performed by: Arthor Captain, PA-C Authorized by: Arthor Captain, PA-C   Consent:    Consent obtained:  Verbal   Consent given by:  Patient   Risks discussed:  Infection, need for additional repair, pain, poor cosmetic result and poor wound healing   Alternatives discussed:  No treatment and delayed treatment Universal protocol:    Procedure explained and questions answered to patient or proxy's satisfaction: yes     Relevant documents present and verified: yes     Test results available: yes     Imaging studies available: yes     Required blood products, implants, devices, and special equipment available: yes     Site/side marked: yes      Immediately prior to procedure, a time out was called: yes     Patient identity confirmed:  Verbally with patient Anesthesia:    Anesthesia method:  Local infiltration Laceration details:    Location:  Scalp   Scalp location:  Occipital   Length (cm):  6   Depth (mm):  10 Pre-procedure details:    Preparation:  Patient was prepped and draped in usual sterile fashion Exploration:    Wound exploration: wound explored through full range of motion and entire depth of wound visualized     Contaminated: yes   Treatment:    Area cleansed with:  Saline   Irrigation solution:  Sterile saline   Irrigation method:  Pressure wash   Visualized foreign bodies/material removed: no   Skin repair:    Repair method:  Tissue adhesive (hair apposition) Repair type:    Repair type:  Simple Post-procedure details:    Dressing:  Open (no dressing)   Procedure completion:  Tolerated well, no immediate complications   Medications Ordered in ED Medications - No data to display  ED Course  I have reviewed the triage vital signs and the nursing notes.  Pertinent labs & imaging results that were available during my care of the patient were reviewed by me and considered in my medical decision making (see chart for details).    MDM Rules/Calculators/A&P                           Final Clinical Impression(s) / ED Diagnoses Final diagnoses:  None   87 year old female here after fall.  She is having multiple falls regularly..  Daughter-in-law is at bedside and we have discussed findings of the CT scan.  Patient had a laceration to the posterior Occiput which was repaired with hair apposition.  Have discussed home supportive care, return precautions and tissue adhesive wound care.  I personally ordered and reviewed CT head and C-spine which showed no acute abnormalities.  She appears appropriate for discharge at this time  Rx / DC Orders ED Discharge Orders     None        Arthor Captain,  PA-C 09/20/21 1635    Jacalyn Lefevre, MD 09/23/21 [phone]

## 2021-09-20 NOTE — ED Triage Notes (Signed)
Pt arrived from home with [facility]. Pt went to Dr on [facility] for UTI and waiting on results. Pt fell from standing position and fell backwards. Broken rib from fall on Weds. Laceration to back of head. Pt uses a walker to walk. Family states pt is at baseline.

## 2021-09-20 NOTE — Discharge Instructions (Addendum)
It may be helpful to use a knee brace or knee sleeve when up and walking on the knee that gets "stuck." Get help right away if: You have: A severe headache that is not helped by medicine. Trouble walking or weakness in your arms and legs. Clear or bloody fluid coming from your nose or ears. Changes in your vision. A seizure. Increased confusion or irritability. Your symptoms get worse. You are sleepier than normal and have trouble staying awake. You lose your balance. Your pupils change size. Your speech is slurred. Your dizziness gets worse. You vomit.

## 2021-09-20 NOTE — ED Notes (Signed)
Several different stages bruising to head noted. Laceration cleaned and blood removed from pt.

## 2021-09-23 ENCOUNTER — Telehealth: Payer: Self-pay | Admitting: Family Medicine

## 2021-09-23 NOTE — Telephone Encounter (Signed)
Spoke with daughter in Social worker.  Dr. Louanne Skye does not have an appointment available until next week and she does not want to wait that long.  Patient has several falls recently with injuries.  Appointment scheduled tomorrow at 10:50 am with Kari Baars.

## 2021-09-24 ENCOUNTER — Other Ambulatory Visit: Payer: Self-pay

## 2021-09-24 ENCOUNTER — Encounter: Payer: Self-pay | Admitting: Family Medicine

## 2021-09-24 ENCOUNTER — Ambulatory Visit (INDEPENDENT_AMBULATORY_CARE_PROVIDER_SITE_OTHER): Payer: Medicare Other | Admitting: Family Medicine

## 2021-09-24 VITALS — BP 167/88 | HR 64 | Temp 97.7°F | Ht 62.0 in | Wt 125.0 lb

## 2021-09-24 DIAGNOSIS — S0101XD Laceration without foreign body of scalp, subsequent encounter: Secondary | ICD-10-CM

## 2021-09-24 DIAGNOSIS — R296 Repeated falls: Secondary | ICD-10-CM | POA: Diagnosis not present

## 2021-09-24 DIAGNOSIS — R54 Age-related physical debility: Secondary | ICD-10-CM | POA: Diagnosis not present

## 2021-09-24 NOTE — Progress Notes (Signed)
Subjective:  Patient ID: Melissa Hall, female    DOB: Apr 03, 1928, 87 y.o.   MRN: [id number]  Patient Care Team: Dettinger, Elige Radon, MD as PCP - General (Family Medicine) Michaelle Copas, MD as Referring Physician (Optometry) Marjory Lies, Glenford Bayley, MD as Consulting Physician (Neurology) Gwenith Daily, RN as Case Manager   Chief Complaint:  Follow-up (7 falls x3weeks)   HPI: Melissa Hall is a 87 y.o. female presenting on 09/24/2021 for Follow-up (7 falls x3weeks)   Pt presents today for follow up after recent ED visits for falls. Family reports she has had 7 falls in last 3 weeks. She has a rib fracture from previous fall. ED visit 09/18/2021 and 09/20/2021 revealed normal head CT and CT C-Spine. She has scattered bruising to her lower legs and arms. She has swelling to bilateral knees. Family states she does not use her walker all of the time and climbs on chairs to get things out of cabinets. She states she uses her walker at times when at home but not all of the time. Falls occur with and without walker. She can not recall if she has dizziness prior to falls. No chest pain, palpitations, or shortness of breath.    Relevant past medical, surgical, family, and social history reviewed and updated as indicated.  Allergies and medications reviewed and updated. Data reviewed: Chart in Epic.   Past Medical History:  Diagnosis Date   Arthritis    Cataract    GERD (gastroesophageal reflux disease)    High cholesterol    Hypertension    Osteoporosis     Past Surgical History:  Procedure Laterality Date   ABDOMINAL HYSTERECTOMY     APPENDECTOMY     BACK SURGERY     CATARACT EXTRACTION W/PHACO  10/18/2012   Procedure: CATARACT EXTRACTION PHACO AND INTRAOCULAR LENS PLACEMENT (IOC);  Surgeon: Gemma Payor, MD;  Location: [facility];  Service: Ophthalmology;  Laterality: Left;  CDE:17.69   COLONOSCOPY     EYE SURGERY     JOINT REPLACEMENT     bilat   ORIF HUMERUS FRACTURE Left  05/12/2014   Procedure: OPEN REDUCTION INTERNAL FIXATION (ORIF) PROXIMAL HUMERUS FRACTURE;  Surgeon: Eldred Manges, MD;  Location: [facility] OR;  Service: Orthopedics;  Laterality: Left;  Open Reduction Internal Fixation Left Proximal Humerus   TONSILLECTOMY      Social History   Socioeconomic History   Marital status: Married    Spouse name: Not on file   Number of children: Not on file   Years of education: 2   Highest education level: Associate degree: occupational, Scientist, product/process development, or vocational program  Occupational History   Not on file  Tobacco Use   Smoking status: Never   Smokeless tobacco: Never  Vaping Use   Vaping Use: Never used  Substance and Sexual Activity   Alcohol use: No   Drug use: No   Sexual activity: Not Currently  Other Topics Concern   Not on file  Social History Narrative   Lives at home with her husband of 66 years. They have two adult children. Their son lives locally and their daughter lives at [address]. They have several grandchildren. She is retired and is mostly sedentary. She does do some cooking.    Social Determinants of Health   Financial Resource Strain: Low Risk    Difficulty of Paying Living Expenses: Not hard at all  Food Insecurity: Not on file  Transportation Needs: No Transportation Needs  Lack of Transportation (Medical): No   Lack of Transportation (Non-Medical): No  Physical Activity: Inactive   Days of Exercise per Week: 0 days   Minutes of Exercise per Session: 0 min  Stress: Not on file  Social Connections: Not on file  Intimate Partner Violence: Not on file    Outpatient Encounter Medications as of 09/24/2021  Medication Sig   ASPIRIN 81 PO aspirin 81 mg tablet,delayed release   81 mg by oral route.   diclofenac Sodium (VOLTAREN) 1 % GEL Apply 2 g topically 4 (four) times daily.   hydrochlorothiazide (MICROZIDE) 12.5 MG capsule Take 1 capsule (12.5 mg total) by mouth daily.   pravastatin (PRAVACHOL) 10 MG tablet Take 1 tablet (10  mg total) by mouth daily.   sertraline (ZOLOFT) 50 MG tablet Take 1 tablet (50 mg total) by mouth daily.   No facility-administered encounter medications on file as of 09/24/2021.    Allergies  Allergen Reactions   Keflex [Cephalexin] Rash    Review of Systems  Constitutional:  Negative for activity change, appetite change, chills, diaphoresis, fatigue, fever and unexpected weight change.  Respiratory:  Negative for cough, chest tightness and shortness of breath.   Cardiovascular:  Positive for leg swelling. Negative for chest pain and palpitations.  Genitourinary:  Negative for decreased urine volume.  Musculoskeletal:  Positive for arthralgias, back pain, gait problem, joint swelling and myalgias. Negative for neck pain and neck stiffness.  Skin:  Positive for wound.  Neurological:  Positive for weakness (generalized). Negative for dizziness, tremors, seizures, syncope, facial asymmetry, speech difficulty, light-headedness, numbness and headaches.  Psychiatric/Behavioral:  Positive for behavioral problems and confusion. Negative for agitation, decreased concentration, dysphoric mood, hallucinations, self-injury, sleep disturbance and suicidal ideas. The patient is not nervous/anxious and is not hyperactive.   All other systems reviewed and are negative.      Objective:  BP (!) 167/88 (BP Location: Right Arm, Patient Position: Sitting, Cuff Size: Small)   Pulse 64   Temp 97.7 F (36.5 C)   Ht 5\' 2"  (1.575 m)   Wt 125 lb (56.7 kg)   SpO2 96%   BMI 22.86 kg/m    Wt Readings from Last 3 Encounters:  09/24/21 125 lb (56.7 kg)  09/20/21 125 lb (56.7 kg)  09/18/21 125 lb (56.7 kg)    Physical Exam Vitals and nursing note reviewed.  Constitutional:      General: She is not in acute distress.    Appearance: Normal appearance. She is well-developed, well-groomed and normal weight. She is not ill-appearing, toxic-appearing or diaphoretic.  HENT:     Head: Normocephalic and  atraumatic.     Jaw: There is normal jaw occlusion.     Right Ear: Hearing normal.     Left Ear: Hearing normal.     Nose: Nose normal.     Mouth/Throat:     Lips: Pink.     Mouth: Mucous membranes are moist.     Pharynx: Oropharynx is clear. Uvula midline.  Eyes:     General: Lids are normal.     Extraocular Movements: Extraocular movements intact.     Conjunctiva/sclera: Conjunctivae normal.     Pupils: Pupils are equal, round, and reactive to light.  Neck:     Thyroid: No thyroid mass, thyromegaly or thyroid tenderness.     Vascular: No carotid bruit or JVD.     Trachea: Trachea and phonation normal.  Cardiovascular:     Rate and Rhythm: Normal rate and regular rhythm.  Chest Wall: PMI is not displaced.     Pulses: Normal pulses.     Heart sounds: Normal heart sounds. No murmur heard.   No friction rub. No gallop.  Pulmonary:     Effort: Pulmonary effort is normal. No respiratory distress.     Breath sounds: Normal breath sounds. No wheezing.  Abdominal:     General: Bowel sounds are normal. There is no abdominal bruit.     Palpations: Abdomen is soft. There is no hepatomegaly or splenomegaly.  Musculoskeletal:        General: No deformity or signs of injury.     Cervical back: Normal range of motion and neck supple.     Thoracic back: Scoliosis present.     Lumbar back: Scoliosis present.     Right hip: Normal.     Left hip: Normal.     Right upper leg: Normal.     Left upper leg: Normal.     Right knee: Swelling present. No bony tenderness. Decreased range of motion. No tenderness.     Left knee: Swelling present. No bony tenderness. Decreased range of motion. No tenderness.     Right lower leg: No deformity, lacerations, tenderness or bony tenderness. 1+ Edema (minimal) present.     Left lower leg: No deformity, lacerations, tenderness or bony tenderness. 1+ Edema (minimal) present.     Right ankle: Normal.     Left ankle: Normal.  Lymphadenopathy:     Cervical:  No cervical adenopathy.  Skin:    General: Skin is warm and dry.     Capillary Refill: Capillary refill takes less than 2 seconds.     Coloration: Skin is not cyanotic, jaundiced or pale.     Findings: Bruising, ecchymosis and laceration present. No rash.       Neurological:     General: No focal deficit present.     Mental Status: She is alert and oriented to person, place, and time. Mental status is at baseline.     Cranial Nerves: Cranial nerves are intact.     Sensory: Sensation is intact.     Motor: Weakness (generalized) present.     Coordination: Coordination is intact.     Gait: Gait abnormal (slow, using walker).     Deep Tendon Reflexes: Reflexes are normal and symmetric.  Psychiatric:        Attention and Perception: Attention and perception normal.        Mood and Affect: Mood and affect normal.        Speech: Speech normal.        Behavior: Behavior normal. Behavior is cooperative.        Thought Content: Thought content normal.        Cognition and Memory: Cognition and memory normal.        Judgment: Judgment normal.    Results for orders placed or performed during the hospital encounter of 09/18/21  Comprehensive metabolic panel  Result Value Ref Range   Sodium 140 135 - 145 mmol/L   Potassium 3.0 (L) 3.5 - 5.1 mmol/L   Chloride 102 98 - 111 mmol/L   CO2 31 22 - 32 mmol/L   Glucose, Bld 136 (H) 70 - 99 mg/dL   BUN 14 8 - 23 mg/dL   Creatinine, Ser 8.41 0.44 - 1.00 mg/dL   Calcium 9.2 8.9 - 66.0 mg/dL   Total Protein 6.7 6.5 - 8.1 g/dL   Albumin 3.7 3.5 - 5.0 g/dL   AST 30  15 - 41 U/L   ALT 17 0 - 44 U/L   Alkaline Phosphatase 74 38 - 126 U/L   Total Bilirubin 0.4 0.3 - 1.2 mg/dL   GFR, Estimated >62 >69 mL/min   Anion gap 7 5 - 15  CBC with Differential  Result Value Ref Range   WBC 8.0 4.0 - 10.5 K/uL   RBC 3.92 3.87 - 5.11 MIL/uL   Hemoglobin 12.3 12.0 - 15.0 g/dL   HCT 48.5 46.2 - 70.3 %   MCV 98.2 80.0 - 100.0 fL   MCH 31.4 26.0 - 34.0 pg    MCHC 31.9 30.0 - 36.0 g/dL   RDW 50.0 93.8 - 18.2 %   Platelets 181 150 - 400 K/uL   nRBC 0.0 0.0 - 0.2 %   Neutrophils Relative % 83 %   Neutro Abs 6.6 1.7 - 7.7 K/uL   Lymphocytes Relative 10 %   Lymphs Abs 0.8 0.7 - 4.0 K/uL   Monocytes Relative 7 %   Monocytes Absolute 0.5 0.1 - 1.0 K/uL   Eosinophils Relative 0 %   Eosinophils Absolute 0.0 0.0 - 0.5 K/uL   Basophils Relative 0 %   Basophils Absolute 0.0 0.0 - 0.1 K/uL   Immature Granulocytes 0 %   Abs Immature Granulocytes 0.02 0.00 - 0.07 K/uL  Urinalysis, Routine w reflex microscopic Urine, Clean Catch  Result Value Ref Range   Color, Urine YELLOW YELLOW   APPearance CLOUDY (A) CLEAR   Specific Gravity, Urine 1.011 1.005 - 1.030   pH 7.0 5.0 - 8.0   Glucose, UA NEGATIVE NEGATIVE mg/dL   Hgb urine dipstick NEGATIVE NEGATIVE   Bilirubin Urine NEGATIVE NEGATIVE   Ketones, ur NEGATIVE NEGATIVE mg/dL   Protein, ur 30 (A) NEGATIVE mg/dL   Nitrite NEGATIVE NEGATIVE   Leukocytes,Ua SMALL (A) NEGATIVE   RBC / HPF 0-5 0 - 5 RBC/hpf   WBC, UA 21-50 0 - 5 WBC/hpf   Bacteria, UA RARE (A) NONE SEEN   Squamous Epithelial / LPF 11-20 0 - 5   Mucus PRESENT    Amorphous Crystal PRESENT    Ca Oxalate Crys, UA PRESENT        Pertinent labs & imaging results that were available during my care of the patient were reviewed by me and considered in my medical decision making.  Assessment & Plan:  Yissel was seen today for follow-up.  Diagnoses and all orders for this visit:  Frequent falls Frailty syndrome in geriatric patient Multiple falls in last few weeks. Will place referral to in home PT to help with gait training, fall prevention, and strengthening exercises. Pt aware to use walker at all times. Labs and imaging from recent ED visits unremarkable.  -     Ambulatory referral to Home Health  Laceration of scalp, subsequent encounter Laceration repaired with Dermabond. Healing well without redness, swelling, or drainage. Wound  care discussed in detail.     Continue all other maintenance medications.  Follow up plan: Return in about 3 months (around 12/25/2021), or if symptoms worsen or fail to improve, for PCP.   Continue healthy lifestyle choices, including diet (rich in fruits, vegetables, and lean proteins, and low in salt and simple carbohydrates) and exercise (at least 30 minutes of moderate physical activity daily).  Educational handout given for frequent falls  The above assessment and management plan was discussed with the patient. The patient verbalized understanding of and has agreed to the management plan. Patient is aware to  call the clinic if they develop any new symptoms or if symptoms persist or worsen. Patient is aware when to return to the clinic for a follow-up visit. Patient educated on when it is appropriate to go to the emergency department.   Melissa Pouch, FNP-C [facility] [phone]

## 2021-09-26 ENCOUNTER — Telehealth: Payer: Self-pay | Admitting: Family Medicine

## 2021-09-26 NOTE — Telephone Encounter (Signed)
I agree that either I or Marcelino Duster can fill out the FL 2 to help them.

## 2021-09-26 NOTE — Telephone Encounter (Signed)
FL2 filled out and placed on providers desk for signature

## 2021-10-01 NOTE — Telephone Encounter (Signed)
Aware FL2 ready to be picked up

## 2021-10-02 DIAGNOSIS — G301 Alzheimer's disease with late onset: Secondary | ICD-10-CM | POA: Diagnosis not present

## 2021-10-02 DIAGNOSIS — Z7982 Long term (current) use of aspirin: Secondary | ICD-10-CM | POA: Diagnosis not present

## 2021-10-02 DIAGNOSIS — I10 Essential (primary) hypertension: Secondary | ICD-10-CM | POA: Diagnosis not present

## 2021-10-02 DIAGNOSIS — Z9181 History of falling: Secondary | ICD-10-CM | POA: Diagnosis not present

## 2021-10-02 DIAGNOSIS — M800AXD Age-related osteoporosis with current pathological fracture, other site, subsequent encounter for fracture with routine healing: Secondary | ICD-10-CM | POA: Diagnosis not present

## 2021-10-02 DIAGNOSIS — W19XXXD Unspecified fall, subsequent encounter: Secondary | ICD-10-CM | POA: Diagnosis not present

## 2021-10-02 DIAGNOSIS — M1990 Unspecified osteoarthritis, unspecified site: Secondary | ICD-10-CM | POA: Diagnosis not present

## 2021-10-02 DIAGNOSIS — E7800 Pure hypercholesterolemia, unspecified: Secondary | ICD-10-CM | POA: Diagnosis not present

## 2021-10-03 DIAGNOSIS — W19XXXD Unspecified fall, subsequent encounter: Secondary | ICD-10-CM | POA: Diagnosis not present

## 2021-10-03 DIAGNOSIS — M800AXD Age-related osteoporosis with current pathological fracture, other site, subsequent encounter for fracture with routine healing: Secondary | ICD-10-CM | POA: Diagnosis not present

## 2021-10-03 DIAGNOSIS — Z7982 Long term (current) use of aspirin: Secondary | ICD-10-CM | POA: Diagnosis not present

## 2021-10-03 DIAGNOSIS — E7800 Pure hypercholesterolemia, unspecified: Secondary | ICD-10-CM | POA: Diagnosis not present

## 2021-10-03 DIAGNOSIS — M1990 Unspecified osteoarthritis, unspecified site: Secondary | ICD-10-CM | POA: Diagnosis not present

## 2021-10-03 DIAGNOSIS — I10 Essential (primary) hypertension: Secondary | ICD-10-CM | POA: Diagnosis not present

## 2021-10-03 DIAGNOSIS — G301 Alzheimer's disease with late onset: Secondary | ICD-10-CM | POA: Diagnosis not present

## 2021-10-03 DIAGNOSIS — Z9181 History of falling: Secondary | ICD-10-CM | POA: Diagnosis not present

## 2021-10-07 DIAGNOSIS — Z9181 History of falling: Secondary | ICD-10-CM | POA: Diagnosis not present

## 2021-10-07 DIAGNOSIS — I10 Essential (primary) hypertension: Secondary | ICD-10-CM | POA: Diagnosis not present

## 2021-10-07 DIAGNOSIS — E7800 Pure hypercholesterolemia, unspecified: Secondary | ICD-10-CM | POA: Diagnosis not present

## 2021-10-07 DIAGNOSIS — M800AXD Age-related osteoporosis with current pathological fracture, other site, subsequent encounter for fracture with routine healing: Secondary | ICD-10-CM | POA: Diagnosis not present

## 2021-10-07 DIAGNOSIS — M1990 Unspecified osteoarthritis, unspecified site: Secondary | ICD-10-CM | POA: Diagnosis not present

## 2021-10-07 DIAGNOSIS — Z7982 Long term (current) use of aspirin: Secondary | ICD-10-CM | POA: Diagnosis not present

## 2021-10-07 DIAGNOSIS — G301 Alzheimer's disease with late onset: Secondary | ICD-10-CM | POA: Diagnosis not present

## 2021-10-07 DIAGNOSIS — W19XXXD Unspecified fall, subsequent encounter: Secondary | ICD-10-CM | POA: Diagnosis not present

## 2021-10-09 DIAGNOSIS — W19XXXD Unspecified fall, subsequent encounter: Secondary | ICD-10-CM | POA: Diagnosis not present

## 2021-10-09 DIAGNOSIS — I10 Essential (primary) hypertension: Secondary | ICD-10-CM | POA: Diagnosis not present

## 2021-10-09 DIAGNOSIS — E7800 Pure hypercholesterolemia, unspecified: Secondary | ICD-10-CM | POA: Diagnosis not present

## 2021-10-09 DIAGNOSIS — Z7982 Long term (current) use of aspirin: Secondary | ICD-10-CM | POA: Diagnosis not present

## 2021-10-09 DIAGNOSIS — M800AXD Age-related osteoporosis with current pathological fracture, other site, subsequent encounter for fracture with routine healing: Secondary | ICD-10-CM | POA: Diagnosis not present

## 2021-10-09 DIAGNOSIS — Z9181 History of falling: Secondary | ICD-10-CM | POA: Diagnosis not present

## 2021-10-09 DIAGNOSIS — G301 Alzheimer's disease with late onset: Secondary | ICD-10-CM | POA: Diagnosis not present

## 2021-10-09 DIAGNOSIS — M1990 Unspecified osteoarthritis, unspecified site: Secondary | ICD-10-CM | POA: Diagnosis not present

## 2021-10-14 DIAGNOSIS — W19XXXD Unspecified fall, subsequent encounter: Secondary | ICD-10-CM | POA: Diagnosis not present

## 2021-10-14 DIAGNOSIS — I10 Essential (primary) hypertension: Secondary | ICD-10-CM | POA: Diagnosis not present

## 2021-10-14 DIAGNOSIS — Z7982 Long term (current) use of aspirin: Secondary | ICD-10-CM | POA: Diagnosis not present

## 2021-10-14 DIAGNOSIS — Z9181 History of falling: Secondary | ICD-10-CM | POA: Diagnosis not present

## 2021-10-14 DIAGNOSIS — M1990 Unspecified osteoarthritis, unspecified site: Secondary | ICD-10-CM | POA: Diagnosis not present

## 2021-10-14 DIAGNOSIS — M800AXD Age-related osteoporosis with current pathological fracture, other site, subsequent encounter for fracture with routine healing: Secondary | ICD-10-CM | POA: Diagnosis not present

## 2021-10-14 DIAGNOSIS — E7800 Pure hypercholesterolemia, unspecified: Secondary | ICD-10-CM | POA: Diagnosis not present

## 2021-10-14 DIAGNOSIS — G301 Alzheimer's disease with late onset: Secondary | ICD-10-CM | POA: Diagnosis not present

## 2021-10-16 DIAGNOSIS — M1990 Unspecified osteoarthritis, unspecified site: Secondary | ICD-10-CM | POA: Diagnosis not present

## 2021-10-16 DIAGNOSIS — W19XXXD Unspecified fall, subsequent encounter: Secondary | ICD-10-CM | POA: Diagnosis not present

## 2021-10-16 DIAGNOSIS — I10 Essential (primary) hypertension: Secondary | ICD-10-CM | POA: Diagnosis not present

## 2021-10-16 DIAGNOSIS — Z7982 Long term (current) use of aspirin: Secondary | ICD-10-CM | POA: Diagnosis not present

## 2021-10-16 DIAGNOSIS — M800AXD Age-related osteoporosis with current pathological fracture, other site, subsequent encounter for fracture with routine healing: Secondary | ICD-10-CM | POA: Diagnosis not present

## 2021-10-16 DIAGNOSIS — G301 Alzheimer's disease with late onset: Secondary | ICD-10-CM | POA: Diagnosis not present

## 2021-10-16 DIAGNOSIS — Z9181 History of falling: Secondary | ICD-10-CM | POA: Diagnosis not present

## 2021-10-16 DIAGNOSIS — E7800 Pure hypercholesterolemia, unspecified: Secondary | ICD-10-CM | POA: Diagnosis not present

## 2021-11-19 ENCOUNTER — Emergency Department (HOSPITAL_COMMUNITY): Payer: Medicare Other

## 2021-11-19 ENCOUNTER — Encounter (HOSPITAL_COMMUNITY): Payer: Self-pay | Admitting: Emergency Medicine

## 2021-11-19 ENCOUNTER — Emergency Department (HOSPITAL_COMMUNITY)
Admission: EM | Admit: 2021-11-19 | Discharge: 2021-11-19 | Disposition: A | Discharge status: Home / Self Care | Payer: Medicare Other | Attending: Emergency Medicine | Admitting: Emergency Medicine

## 2021-11-19 DIAGNOSIS — Z79899 Other long term (current) drug therapy: Secondary | ICD-10-CM | POA: Insufficient documentation

## 2021-11-19 DIAGNOSIS — W1839XA Other fall on same level, initial encounter: Secondary | ICD-10-CM | POA: Diagnosis not present

## 2021-11-19 DIAGNOSIS — W19XXXA Unspecified fall, initial encounter: Secondary | ICD-10-CM | POA: Diagnosis not present

## 2021-11-19 DIAGNOSIS — I10 Essential (primary) hypertension: Secondary | ICD-10-CM | POA: Diagnosis not present

## 2021-11-19 DIAGNOSIS — N3000 Acute cystitis without hematuria: Secondary | ICD-10-CM | POA: Diagnosis not present

## 2021-11-19 DIAGNOSIS — M4802 Spinal stenosis, cervical region: Secondary | ICD-10-CM | POA: Diagnosis not present

## 2021-11-19 DIAGNOSIS — F0390 Unspecified dementia without behavioral disturbance: Secondary | ICD-10-CM | POA: Diagnosis not present

## 2021-11-19 DIAGNOSIS — Z9660 Presence of unspecified orthopedic joint implant: Secondary | ICD-10-CM | POA: Insufficient documentation

## 2021-11-19 DIAGNOSIS — Z7982 Long term (current) use of aspirin: Secondary | ICD-10-CM | POA: Insufficient documentation

## 2021-11-19 DIAGNOSIS — Z87828 Personal history of other (healed) physical injury and trauma: Secondary | ICD-10-CM | POA: Diagnosis not present

## 2021-11-19 DIAGNOSIS — R296 Repeated falls: Secondary | ICD-10-CM | POA: Diagnosis not present

## 2021-11-19 DIAGNOSIS — S0990XA Unspecified injury of head, initial encounter: Secondary | ICD-10-CM | POA: Diagnosis not present

## 2021-11-19 DIAGNOSIS — K449 Diaphragmatic hernia without obstruction or gangrene: Secondary | ICD-10-CM | POA: Diagnosis not present

## 2021-11-19 DIAGNOSIS — S0003XA Contusion of scalp, initial encounter: Secondary | ICD-10-CM | POA: Insufficient documentation

## 2021-11-19 DIAGNOSIS — R0902 Hypoxemia: Secondary | ICD-10-CM | POA: Diagnosis not present

## 2021-11-19 DIAGNOSIS — Z743 Need for continuous supervision: Secondary | ICD-10-CM | POA: Diagnosis not present

## 2021-11-19 DIAGNOSIS — I6529 Occlusion and stenosis of unspecified carotid artery: Secondary | ICD-10-CM | POA: Diagnosis not present

## 2021-11-19 LAB — URINALYSIS, ROUTINE W REFLEX MICROSCOPIC
Bilirubin Urine: NEGATIVE
Glucose, UA: NEGATIVE mg/dL
Hgb urine dipstick: NEGATIVE
Ketones, ur: NEGATIVE mg/dL
Nitrite: NEGATIVE
Protein, ur: NEGATIVE mg/dL
Specific Gravity, Urine: 1.025 (ref 1.005–1.030)
pH: 5.5 (ref 5.0–8.0)

## 2021-11-19 LAB — BASIC METABOLIC PANEL
Anion gap: 7 (ref 5–15)
BUN: 25 mg/dL — ABNORMAL HIGH (ref 8–23)
CO2: 33 mmol/L — ABNORMAL HIGH (ref 22–32)
Calcium: 9.1 mg/dL (ref 8.9–10.3)
Chloride: 102 mmol/L (ref 98–111)
Creatinine, Ser: 0.66 mg/dL (ref 0.44–1.00)
GFR, Estimated: >60 mL/min (ref >60)
Glucose, Bld: 124 mg/dL — ABNORMAL HIGH (ref 70–99)
Potassium: 3.8 mmol/L (ref 3.5–5.1)
Sodium: 142 mmol/L (ref 135–145)

## 2021-11-19 LAB — URINALYSIS, MICROSCOPIC (REFLEX): RBC / HPF: NONE SEEN RBC/hpf (ref 0–5)

## 2021-11-19 LAB — CBC WITH DIFFERENTIAL/PLATELET
Abs Immature Granulocytes: 0.07 10*3/uL (ref 0.00–0.07)
Basophils Absolute: 0 10*3/uL (ref 0.0–0.1)
Basophils Relative: 1 %
Eosinophils Absolute: 0.1 10*3/uL (ref 0.0–0.5)
Eosinophils Relative: 2 %
HCT: 37 % (ref 36.0–46.0)
Hemoglobin: 11.7 g/dL — ABNORMAL LOW (ref 12.0–15.0)
Immature Granulocytes: 1 %
Lymphocytes Relative: 12 %
Lymphs Abs: 0.8 10*3/uL (ref 0.7–4.0)
MCH: 30 pg (ref 26.0–34.0)
MCHC: 31.6 g/dL (ref 30.0–36.0)
MCV: 94.9 fL (ref 80.0–100.0)
Monocytes Absolute: 0.8 10*3/uL (ref 0.1–1.0)
Monocytes Relative: 12 %
Neutro Abs: 4.7 10*3/uL (ref 1.7–7.7)
Neutrophils Relative %: 72 %
Platelets: 196 10*3/uL (ref 150–400)
RBC: 3.9 MIL/uL (ref 3.87–5.11)
RDW: 13.7 % (ref 11.5–15.5)
WBC: 6.5 10*3/uL (ref 4.0–10.5)
nRBC: 0 % (ref 0.0–0.2)

## 2021-11-19 MED ORDER — FOSFOMYCIN TROMETHAMINE 3 G PO PACK
3.0000 g | PACK | Freq: Once | ORAL | Status: AC
  Administered 2021-11-19: 3 g via ORAL

## 2021-11-19 NOTE — ED Notes (Signed)
Returned for CT

## 2021-11-19 NOTE — ED Notes (Signed)
Patient transported to CT 

## 2021-11-19 NOTE — ED Provider Notes (Signed)
[facility] EMERGENCY DEPARTMENT Provider Note   CSN: [id number] Arrival date & time: 11/19/21  0335     History Chief Complaint  Patient presents with   [name]    Melissa Hall is a 87 y.o. female.  Patient arrives in the emergency department by ambulance from home.  Patient had a fall prior to arrival.  She apparently does have a history of frequent falls.  This is her second fall this week.  She has not on anticoagulation.  Patient does not seem able to answer questions upon arrival.  Level 5 caveat due to either mental status change or dementia.      Past Medical History:  Diagnosis Date   Arthritis    Cataract    GERD (gastroesophageal reflux disease)    High cholesterol    Hypertension    Osteoporosis     Patient Active Problem List   Diagnosis Date Noted   Frequent falls 09/24/2021   Pain in rib 09/05/2021   Fall 09/05/2021   Dementia with behavioral disturbance 09/08/2019   Frailty syndrome in geriatric patient 05/06/2019   Fracture of proximal end of left humerus 05/13/2014   Traumatic closed displaced fracture of proximal end of left humerus 05/12/2014    Class: Acute   Osteoporosis, post-menopausal 01/25/2014   Hyperlipidemia 12/14/2013   Hypertension 12/14/2013   Vitamin D deficiency 12/14/2013   Scoliosis (and kyphoscoliosis), idiopathic 12/14/2013    Past Surgical History:  Procedure Laterality Date   ABDOMINAL HYSTERECTOMY     APPENDECTOMY     BACK SURGERY     CATARACT EXTRACTION W/PHACO  10/18/2012   Procedure: CATARACT EXTRACTION PHACO AND INTRAOCULAR LENS PLACEMENT ([place]);  Surgeon: Tonny Branch, MD;  Location: [facility];  Service: Ophthalmology;  Laterality: Left;  CDE:17.69   COLONOSCOPY     EYE SURGERY     JOINT REPLACEMENT     bilat   ORIF HUMERUS FRACTURE Left 05/12/2014   Procedure: OPEN REDUCTION INTERNAL FIXATION (ORIF) PROXIMAL HUMERUS FRACTURE;  Surgeon: [name], MD;  Location: [facility];  Service: Orthopedics;  Laterality: Left;   Open Reduction Internal Fixation Left Proximal Humerus   TONSILLECTOMY       OB History   No obstetric history on file.     Family History  Problem Relation Age of Onset   Heart disease Mother        Died age 48   Osteoporosis Mother    Heart disease Father        Died in his 64s   Cancer Father    Stroke Father    Suicidality Brother    Suicidality Daughter     Social History   Tobacco Use   Smoking status: Never   Smokeless tobacco: Never  Vaping Use   Vaping Use: Never used  Substance Use Topics   Alcohol use: No   Drug use: No    Home Medications Prior to Admission medications   Medication Sig Start Date End Date Taking? Authorizing Provider  ASPIRIN 81 PO aspirin 81 mg tablet,delayed release   81 mg by oral route.    [provider]  diclofenac Sodium (VOLTAREN) 1 % GEL Apply 2 g topically 4 (four) times daily. 09/04/21   Ivy Lynn, NP  hydrochlorothiazide (MICROZIDE) 12.5 MG capsule Take 1 capsule (12.5 mg total) by mouth daily. 07/04/21   Dettinger, Fransisca Kaufmann, MD  pravastatin (PRAVACHOL) 10 MG tablet Take 1 tablet (10 mg total) by mouth daily. 07/04/21  Dettinger, Fransisca Kaufmann, MD  sertraline (ZOLOFT) 50 MG tablet Take 1 tablet (50 mg total) by mouth daily. 07/04/21   Dettinger, Fransisca Kaufmann, MD    Allergies    Keflex [cephalexin]  Review of Systems   Review of Systems  Unable to perform ROS: Mental status change   Physical Exam Updated Vital Signs BP (!) 152/92 (BP Location: Right Arm)   Pulse 69   Temp 98.7 F (37.1 C) (Oral)   Resp 16   Ht 5\' 2"  (1.575 m)   Wt 56.7 kg   SpO2 97%   BMI 22.86 kg/m   Physical Exam Vitals and nursing note reviewed.  Constitutional:      General: She is not in acute distress.    Appearance: Normal appearance. She is well-developed.  HENT:     Head: Normocephalic. Contusion present.      Right Ear: Hearing normal.     Left Ear: Hearing normal.     Nose: Nose normal.  Eyes:     Conjunctiva/sclera:  Conjunctivae normal.     Pupils: Pupils are equal, round, and reactive to light.  Cardiovascular:     Rate and Rhythm: Regular rhythm.     Heart sounds: S1 normal and S2 normal. No murmur heard.   No friction rub. No gallop.  Pulmonary:     Effort: Pulmonary effort is normal. No respiratory distress.     Breath sounds: Normal breath sounds.  Chest:     Chest wall: No tenderness.  Abdominal:     General: Bowel sounds are normal.     Palpations: Abdomen is soft.     Tenderness: There is no abdominal tenderness. There is no guarding or rebound. Negative signs include Murphy's sign and McBurney's sign.     Hernia: No hernia is present.  Musculoskeletal:        General: Normal range of motion.     Cervical back: Normal range of motion and neck supple.  Skin:    General: Skin is warm and dry.     Findings: No rash.  Neurological:     Mental Status: She is alert.     Cranial Nerves: No cranial nerve deficit.     Sensory: No sensory deficit.     Coordination: Coordination normal.  Psychiatric:        Speech: Speech normal.        Behavior: Behavior normal.        Thought Content: Thought content normal.    ED Results / Procedures / Treatments   Labs (all labs ordered are listed, but only abnormal results are displayed) Labs Reviewed  CBC WITH DIFFERENTIAL/PLATELET - Abnormal; Notable for the following components:      Result Value   Hemoglobin 11.7 (*)    All other components within normal limits  BASIC METABOLIC PANEL - Abnormal; Notable for the following components:   CO2 33 (*)    Glucose, Bld 124 (*)    BUN 25 (*)    All other components within normal limits  URINALYSIS, ROUTINE W REFLEX MICROSCOPIC - Abnormal; Notable for the following components:   Leukocytes,Ua TRACE (*)    All other components within normal limits  URINALYSIS, MICROSCOPIC (REFLEX) - Abnormal; Notable for the following components:   Bacteria, UA MANY (*)    All other components within normal limits   URINE CULTURE    EKG None  Radiology No results found.  Procedures Procedures   Medications Ordered in ED Medications  fosfomycin (MONUROL) packet  3 g (3 g Oral Given 11/19/21 0223)    ED Course  I have reviewed the triage vital signs and the nursing notes.  Pertinent labs & imaging results that were available during my care of the patient were reviewed by me and considered in my medical decision making (see chart for details).    MDM Rules/Calculators/A&P                           Patient presents to the emergency department for evaluation after a fall.  She has had several falls recently and does have a history of frequent falls.  Patient with a hematoma and abrasion to the occipital scalp.  She is awake and alert.  Family is now present and she is at her normal mental status baseline.  Chest x-ray, pelvis x-ray without acute injury.  CT head and cervical spine without acute injury other than the soft tissue injury noted on the scalp.  Basic blood work normal.  Urinalysis with signs of infection.  Final Clinical Impression(s) / ED Diagnoses Final diagnoses:  Contusion of scalp, initial encounter  Acute cystitis without hematuria    Rx / DC Orders ED Discharge Orders     None        Gilda Crease, MD 11/21/21 [phone]

## 2021-11-19 NOTE — ED Notes (Signed)
Patient transported to X-ray 

## 2021-11-19 NOTE — ED Triage Notes (Signed)
Pt brought in from home via [facility]. Pt has had multiple falls over the last several days. Pt has laceration to back of head at this time. Family concerned for UTI.

## 2021-11-21 LAB — URINE CULTURE: Culture: 30000 — AB

## 2021-11-29 ENCOUNTER — Encounter: Payer: Self-pay | Admitting: Nurse Practitioner

## 2021-11-29 ENCOUNTER — Ambulatory Visit (INDEPENDENT_AMBULATORY_CARE_PROVIDER_SITE_OTHER): Payer: Medicare Other | Admitting: Nurse Practitioner

## 2021-11-29 VITALS — BP 121/73 | HR 75 | Temp 98.0°F | Resp 20 | Ht 62.0 in | Wt 120.0 lb

## 2021-11-29 DIAGNOSIS — N3000 Acute cystitis without hematuria: Secondary | ICD-10-CM

## 2021-11-29 DIAGNOSIS — Z09 Encounter for follow-up examination after completed treatment for conditions other than malignant neoplasm: Secondary | ICD-10-CM

## 2021-11-29 DIAGNOSIS — S0003XD Contusion of scalp, subsequent encounter: Secondary | ICD-10-CM

## 2021-11-29 NOTE — Progress Notes (Signed)
   Subjective:    Patient ID: Melissa Hall, female    DOB: [birth year], 87 y.o.   MRN: [id number]  Chief Complaint: Recheck UTI and Check spot to back of head   HPI Patient brought in by her daughter with 2 complaints: - patient went to hospital 2 weeks ago and had UTI- needs urine rechecked for clarity. - Patient fell and hit her head several times. She went to ed and had head scans and was checked good and were all negative. They wanted her to come in for recheck. She still has a knot on her head and she has been picking at it.   Review of Systems  Constitutional:  Negative for diaphoresis.  Eyes:  Negative for pain.  Respiratory:  Negative for shortness of breath.   Cardiovascular:  Negative for chest pain, palpitations and leg swelling.  Gastrointestinal:  Negative for abdominal pain.  Endocrine: Negative for polydipsia.  Skin:  Negative for rash.  Neurological:  Negative for dizziness, weakness and headaches.  Hematological:  Does not bruise/bleed easily.  Psychiatric/Behavioral: Negative.         Severe dementia  All other systems reviewed and are negative.     Objective:   Physical Exam Vitals and nursing note reviewed.  Constitutional:      Appearance: Normal appearance.  HENT:     Head:     Comments: 5cm hematoma on left post scalp. nontender Cardiovascular:     Rate and Rhythm: Normal rate and regular rhythm.     Heart sounds: Normal heart sounds.  Pulmonary:     Breath sounds: Normal breath sounds.  Skin:    General: Skin is warm.  Neurological:     General: No focal deficit present.     Mental Status: She is alert and oriented to person, place, and time.     Cranial Nerves: No cranial nerve deficit.     Sensory: No sensory deficit.  Psychiatric:        Mood and Affect: Mood normal.        Behavior: Behavior normal.    BP 121/73   Pulse 75   Temp 98 F (36.7 C) (Temporal)   Resp 20   Ht 5\' 2"  (1.575 m)   Wt 120 lb (54.4 kg)   SpO2 94%   BMI  21.95 kg/m        Assessment & Plan:   in today with chief complaint of Recheck UTI and Check spot to back of head   1. Acute cystitis without hematuria Patient could not void- sent home with urine cup. Force fluids  2. Hematoma of scalp, subsequent encounter Ice if she will allow Try to keep her from picking at area  3. Hospital discharge follow-up Hospital records reviewed    The above assessment and management plan was discussed with the patient. The patient verbalized understanding of and has agreed to the management plan. Patient is aware to call the clinic if symptoms persist or worsen. Patient is aware when to return to the clinic for a follow-up visit. Patient educated on when it is appropriate to go to the emergency department.    30 minutes spent with patient  Neta-Margaret Melissa Aris, FNP

## 2021-11-29 NOTE — Patient Instructions (Signed)

## 2022-01-06 ENCOUNTER — Ambulatory Visit: Payer: Medicare Other | Admitting: Family Medicine

## 2022-01-16 ENCOUNTER — Telehealth: Payer: Self-pay | Admitting: Family Medicine

## 2022-01-16 ENCOUNTER — Ambulatory Visit (INDEPENDENT_AMBULATORY_CARE_PROVIDER_SITE_OTHER): Payer: Medicare Other

## 2022-01-16 ENCOUNTER — Other Ambulatory Visit: Payer: Self-pay

## 2022-01-16 DIAGNOSIS — Z9181 History of falling: Secondary | ICD-10-CM

## 2022-01-16 DIAGNOSIS — M800AXD Age-related osteoporosis with current pathological fracture, other site, subsequent encounter for fracture with routine healing: Secondary | ICD-10-CM

## 2022-01-16 DIAGNOSIS — G301 Alzheimer's disease with late onset: Secondary | ICD-10-CM

## 2022-01-16 DIAGNOSIS — E7800 Pure hypercholesterolemia, unspecified: Secondary | ICD-10-CM

## 2022-01-16 DIAGNOSIS — W19XXXD Unspecified fall, subsequent encounter: Secondary | ICD-10-CM

## 2022-01-16 DIAGNOSIS — F0280 Dementia in other diseases classified elsewhere without behavioral disturbance: Secondary | ICD-10-CM | POA: Diagnosis not present

## 2022-01-16 DIAGNOSIS — I10 Essential (primary) hypertension: Secondary | ICD-10-CM | POA: Diagnosis not present

## 2022-01-16 DIAGNOSIS — Z7982 Long term (current) use of aspirin: Secondary | ICD-10-CM | POA: Diagnosis not present

## 2022-01-16 DIAGNOSIS — M1990 Unspecified osteoarthritis, unspecified site: Secondary | ICD-10-CM | POA: Diagnosis not present

## 2022-01-16 NOTE — Telephone Encounter (Signed)
Pt scheduled 01/20/22 at 3:55 with Dr Dettinger to discuss maybe increasing zoloft.

## 2022-01-20 ENCOUNTER — Encounter: Payer: Self-pay | Admitting: Family Medicine

## 2022-01-20 ENCOUNTER — Ambulatory Visit (INDEPENDENT_AMBULATORY_CARE_PROVIDER_SITE_OTHER): Payer: Medicare Other | Admitting: Family Medicine

## 2022-01-20 VITALS — BP 125/74 | HR 65 | Wt 116.0 lb

## 2022-01-20 DIAGNOSIS — N3090 Cystitis, unspecified without hematuria: Secondary | ICD-10-CM

## 2022-01-20 DIAGNOSIS — F03918 Unspecified dementia, unspecified severity, with other behavioral disturbance: Secondary | ICD-10-CM | POA: Diagnosis not present

## 2022-01-20 DIAGNOSIS — G301 Alzheimer's disease with late onset: Secondary | ICD-10-CM

## 2022-01-20 DIAGNOSIS — F02818 Dementia in other diseases classified elsewhere, unspecified severity, with other behavioral disturbance: Secondary | ICD-10-CM

## 2022-01-20 MED ORDER — SERTRALINE HCL 100 MG PO TABS: 100.0000 mg | ORAL_TABLET | Freq: Every day | ORAL | 3 refills | Status: DC

## 2022-01-20 MED ORDER — HYDROXYZINE PAMOATE 25 MG PO CAPS: 25.0000 mg | ORAL_CAPSULE | Freq: Three times a day (TID) | ORAL | 5 refills | Status: DC | PRN

## 2022-01-20 NOTE — Progress Notes (Signed)
BP 125/74    Pulse 65    Wt 116 lb (52.6 kg)    SpO2 94%    BMI 21.22 kg/m    Subjective:   Patient ID: Melissa Hall, female    DOB: 04-Feb-1928, 88 y.o.   MRN: [id number]  HPI: Melissa Hall is a 88 y.o. female presenting on 01/20/2022 for Dementia (Daughter in law wants to increase Zoloft. Pt is getting more agitated at night.)   HPI Dementia with behavioral disturbance recheck Patient is coming in today for urgent dementia with behavioral disturbance recheck.  Daughter in law says that she was doing good at first on the Zoloft for the first couple weeks but then it seemed to fade and not helping as much.  She still having severe episodes of aggression and is worse at night.  Her daughter-in-law says this is progressed and now they are having a sitter with her all the time because her memory is also worsening.  She continues to have dark urine and they would like her to leave urine for [place] and were not unable to last time.  They will try to leave urine today.  Relevant past medical, surgical, family and social history reviewed and updated as indicated. Interim medical history since our last visit reviewed. Allergies and medications reviewed and updated.  Review of Systems  Constitutional:  Negative for chills and fever.  Eyes:  Negative for visual disturbance.  Respiratory:  Negative for chest tightness and shortness of breath.   Cardiovascular:  Negative for chest pain and leg swelling.  Skin:  Negative for rash.  Neurological:  Negative for dizziness, light-headedness and headaches.  Psychiatric/Behavioral:  Positive for behavioral problems and confusion. Negative for agitation. The patient is nervous/anxious.   All other systems reviewed and are negative.  Per HPI unless specifically indicated above   Allergies as of 01/20/2022       Reactions   Keflex [cephalexin] Rash        Medication List        Accurate as of January 20, 2022  4:29 PM. If you have any  questions, ask your nurse or doctor.          ASPIRIN 81 PO aspirin 81 mg tablet,delayed release   81 mg by oral route.   diclofenac Sodium 1 % Gel Commonly known as: VOLTAREN Apply 2 g topically 4 (four) times daily.   hydrochlorothiazide 12.5 MG capsule Commonly known as: MICROZIDE Take 1 capsule (12.5 mg total) by mouth daily.   pravastatin 10 MG tablet Commonly known as: PRAVACHOL Take 1 tablet (10 mg total) by mouth daily.   sertraline 100 MG tablet Commonly known as: Zoloft Take 1 tablet (100 mg total) by mouth daily. What changed:  medication strength how much to take Changed by: Fransisca Kaufmann Taegan Haider, MD         Objective:   BP 125/74    Pulse 65    Wt 116 lb (52.6 kg)    SpO2 94%    BMI 21.22 kg/m   Wt Readings from Last 3 Encounters:  01/20/22 116 lb (52.6 kg)  11/29/21 120 lb (54.4 kg)  11/19/21 125 lb (56.7 kg)    Physical Exam Vitals and nursing note reviewed.  Constitutional:      General: She is not in acute distress.    Appearance: She is well-developed. She is not diaphoretic.  Eyes:     Conjunctiva/sclera: Conjunctivae normal.  Cardiovascular:     Rate and  Rhythm: Normal rate and regular rhythm.     Heart sounds: Normal heart sounds. No murmur heard. Pulmonary:     Effort: Pulmonary effort is normal. No respiratory distress.     Breath sounds: Normal breath sounds. No wheezing.  Musculoskeletal:        General: No swelling or tenderness.  Skin:    General: Skin is warm and dry.     Findings: No rash.  Neurological:     Mental Status: She is alert and oriented to person, place, and time.     Coordination: Coordination normal.  Psychiatric:        Mood and Affect: Mood is anxious. Mood is not depressed.        Behavior: Behavior normal.        Cognition and Memory: Cognition is impaired. Memory is impaired. She exhibits impaired recent memory.      Assessment & Plan:   Problem List Items Addressed This Visit       Nervous and  Auditory   Dementia with behavioral disturbance - Primary   Relevant Medications   sertraline (ZOLOFT) 100 MG tablet   Other Visit Diagnoses     Late onset Alzheimer's dementia with behavioral disturbance (HCC)       Relevant Medications   sertraline (ZOLOFT) 100 MG tablet     Increase Zoloft to 100 mg and we will see how she does with that.  Follow up plan: Return in about 3 months (around 04/20/2022), or if symptoms worsen or fail to improve, for Dementia and behavioral disturbance recheck.  Counseling provided for all of the vaccine components No orders of the defined types were placed in this encounter.   Caryl Pina, MD [facility] 01/20/2022, 4:29 PM

## 2022-01-21 ENCOUNTER — Other Ambulatory Visit: Payer: Medicare Other

## 2022-01-21 ENCOUNTER — Other Ambulatory Visit: Payer: Self-pay | Admitting: Family Medicine

## 2022-01-21 DIAGNOSIS — N3090 Cystitis, unspecified without hematuria: Secondary | ICD-10-CM | POA: Diagnosis not present

## 2022-01-21 LAB — MICROSCOPIC EXAMINATION: RBC, Urine: NONE SEEN /hpf (ref 0–2)

## 2022-01-21 LAB — URINALYSIS, COMPLETE
Bilirubin, UA: NEGATIVE
Glucose, UA: NEGATIVE
Nitrite, UA: NEGATIVE
RBC, UA: NEGATIVE
Specific Gravity, UA: >1.03 — ABNORMAL HIGH (ref 1.005–1.030)
Urobilinogen, Ur: 1 mg/dL (ref 0.2–1.0)
pH, UA: 5.5 (ref 5.0–7.5)

## 2022-01-21 MED ORDER — SULFAMETHOXAZOLE-TRIMETHOPRIM 800-160 MG PO TABS: 1.0000 | ORAL_TABLET | Freq: Two times a day (BID) | ORAL | 0 refills | Status: DC

## 2022-01-21 NOTE — Addendum Note (Signed)
Addended by: Baldomero Lamy B on: 01/21/2022 02:25 PM   Modules accepted: Orders

## 2022-01-23 LAB — URINE CULTURE

## 2022-02-25 IMAGING — CT CT CERVICAL SPINE W/O CM
3 of 4 series · 13 of 35 positions shown, 16 images · non-contrast
Comparison: Head CT today reported separately. Cervical spine CT
09/20/2021.

CLINICAL DATA: [AGE] female with multiple recent falls.
Posterior head laceration.

EXAM:
CT CERVICAL SPINE WITHOUT CONTRAST
TECHNIQUE: Multidetector CT imaging of the cervical spine was performed without
intravenous contrast. Multiplanar CT image reconstructions were also
generated.

[Series 5: sagittal bone · sagittal · 0.23mm/px · 5 of 61 slices shown, 6 images]
[im 21/61  bone]
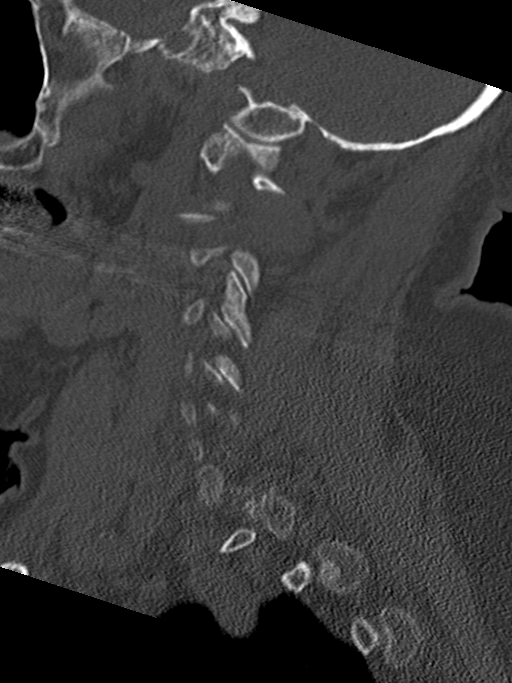
[im 26/61  bone]
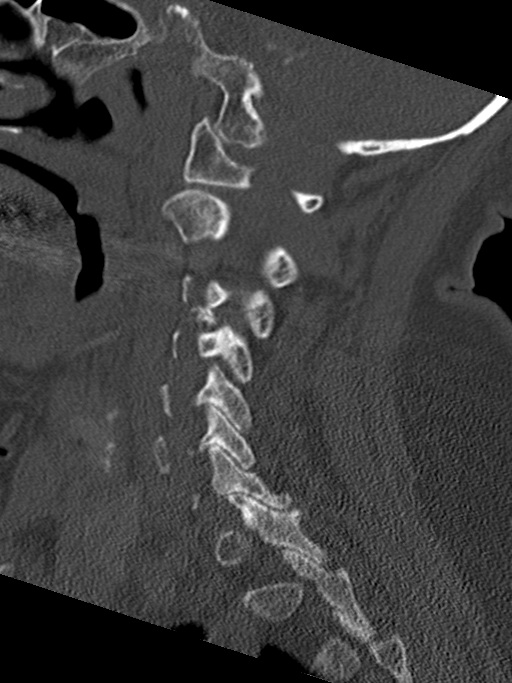
[im 31/61  soft-tissue]
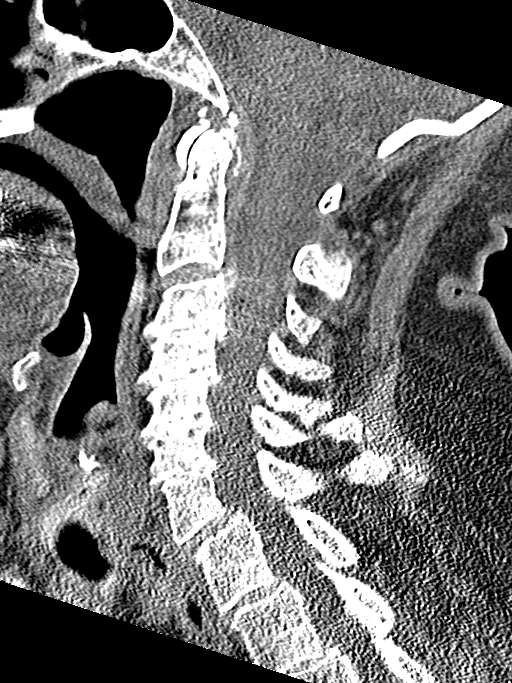
[im 31/61  bone]
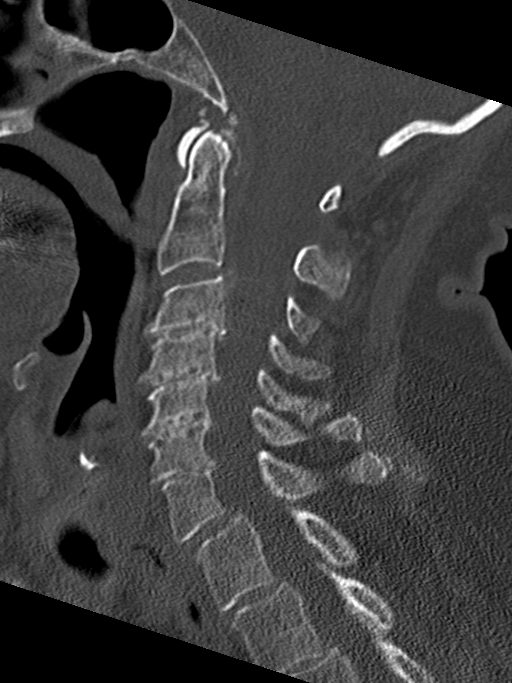
[im 36/61  bone]
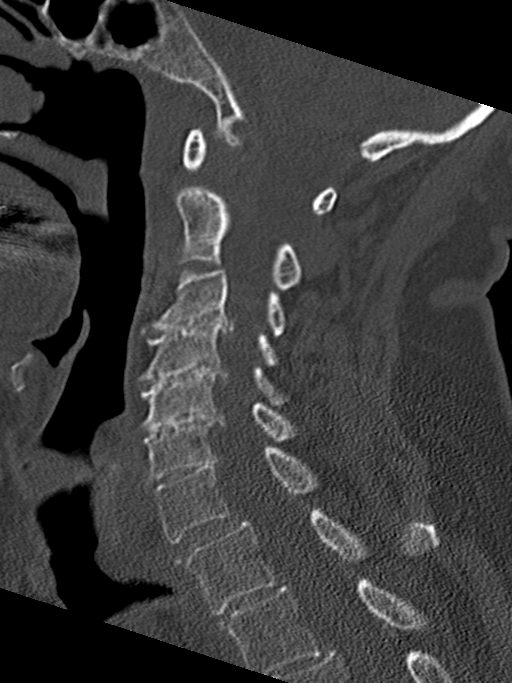
[im 41/61  bone]
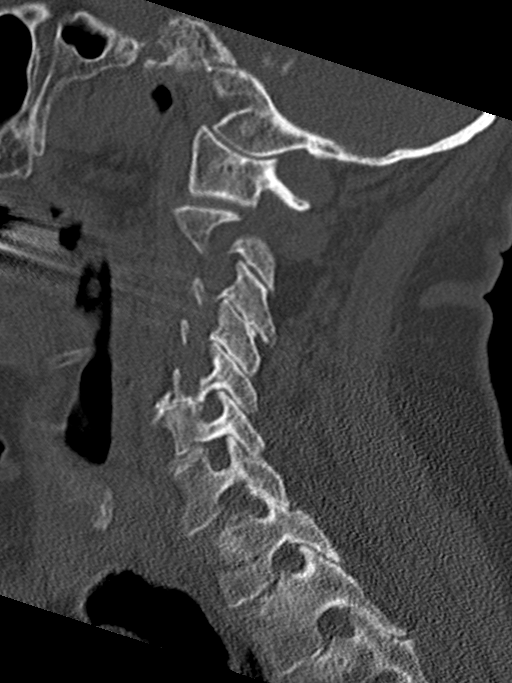

[Series 6: coronal bone · coronal · 0.23mm/px · 3 of 61 slices shown]
[im 13/61  bone]
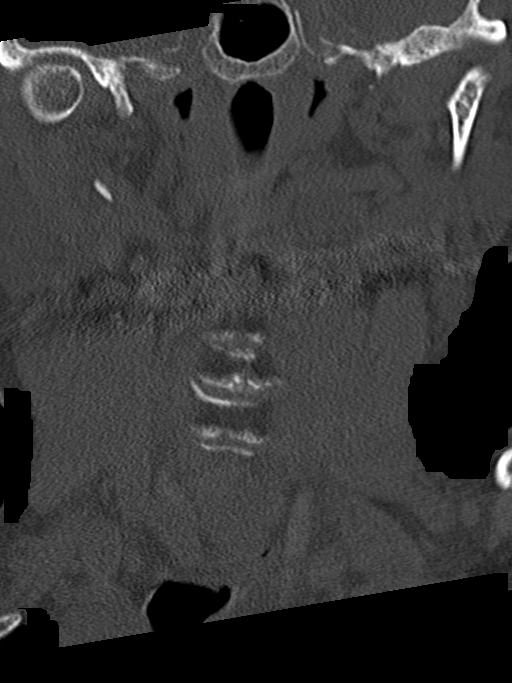
[im 25/61  bone]
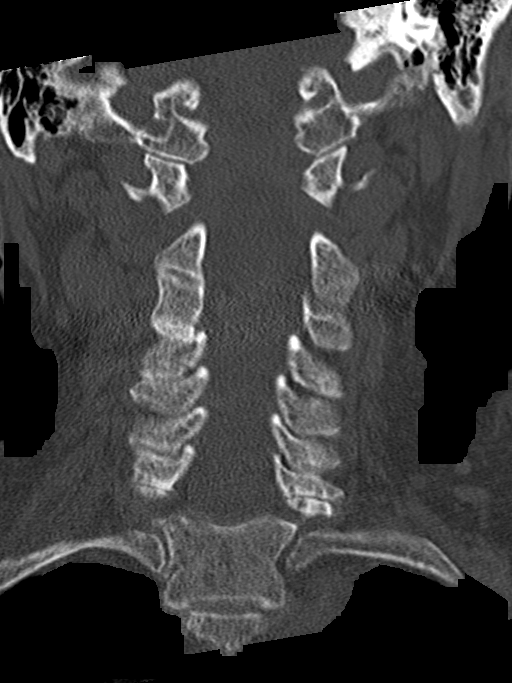
[im 37/61  bone]
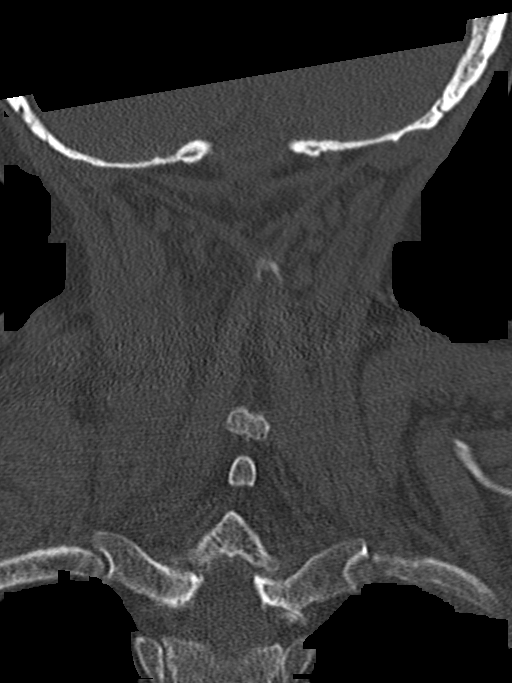

[Series 7: orthogonal axials · oblique · 0.21mm/px · 5 of 76 slices shown, 7 images]
[im 13/76  soft-tissue]
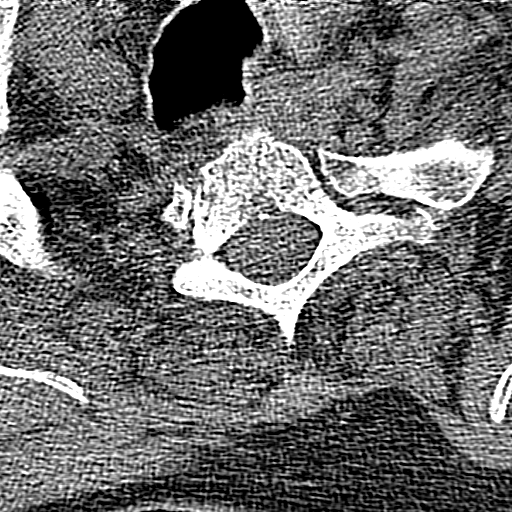
[im 13/76  bone]
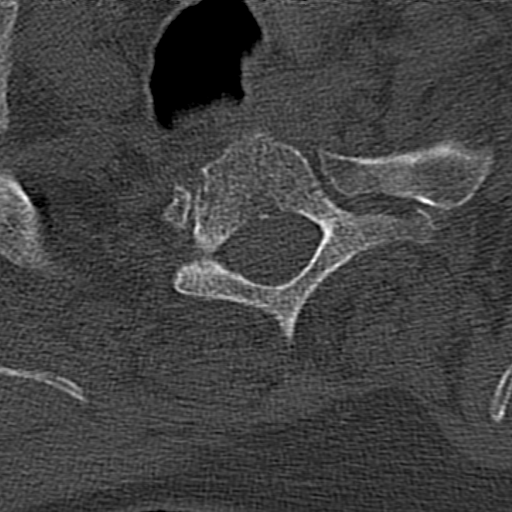
[im 26/76  bone]
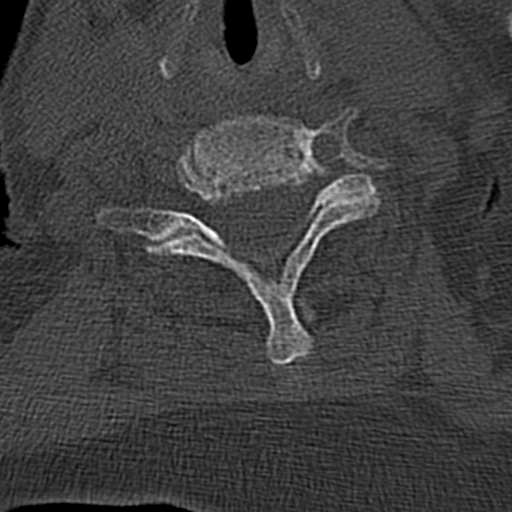
[im 38/76  bone]
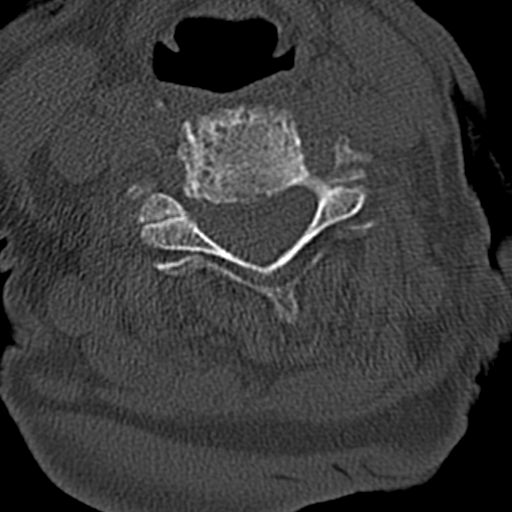
[im 51/76  bone]
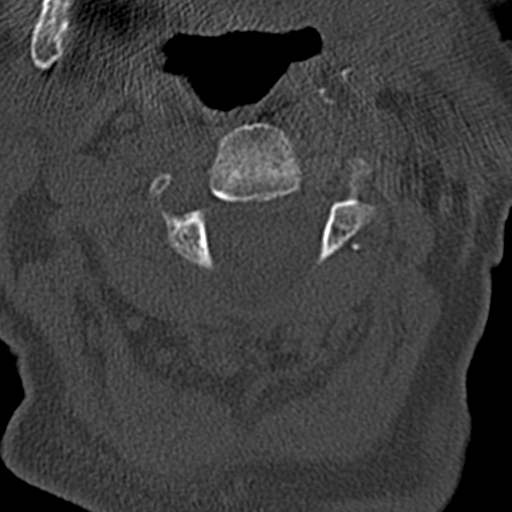
[im 63/76  soft-tissue]
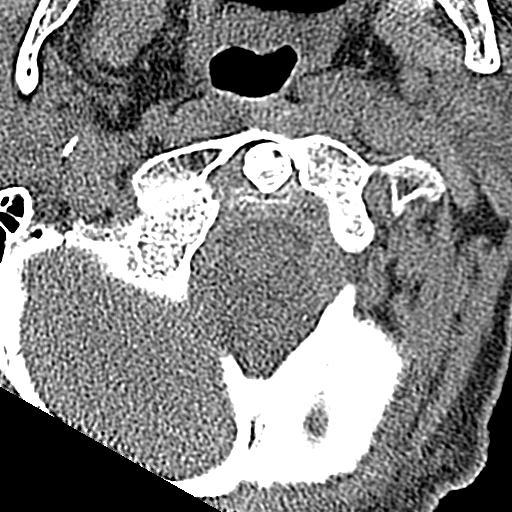
[im 63/76  bone]
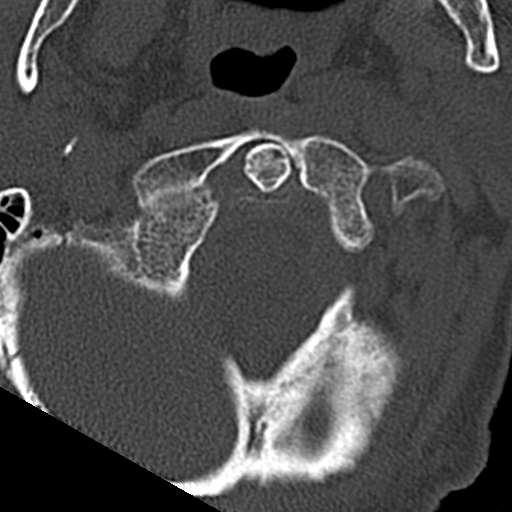

[13 of 35 positions shown; findings below may reference images not displayed]

FINDINGS: Alignment: Stable cervical lordosis and mild degenerative appearing
anterolisthesis of C7 on T1. Bilateral posterior element alignment
is within normal limits.

Skull base and vertebrae: Visualized skull base is intact. No
atlanto-occipital dissociation. C1 and C2 appear intact and aligned.
Osteopenia. No acute osseous abnormality identified.

Soft tissues and spinal canal: No prevertebral fluid or swelling. No
visible canal hematoma. Mild for age cervical carotid calcified
atherosclerosis.

Disc levels: Chronic advanced cervical disc and endplate
degeneration C3-C4 through C5-C6 appears stable. Mild if any
associated cervical spinal stenosis.

Upper chest: Visible upper thoracic levels appear grossly intact.
Negative lung apices.
IMPRESSION: 1. No acute traumatic injury identified in the cervical spine.
2. Stable cervical spine degeneration.

## 2022-02-25 IMAGING — DX DG PORTABLE PELVIS
1 series · 1 of 1 positions shown · non-contrast
Comparison: None.

CLINICAL DATA: [AGE] female with multiple recent falls.
Posterior head laceration.

EXAM:
PORTABLE PELVIS 1-2 VIEWS

[pelvis ap]
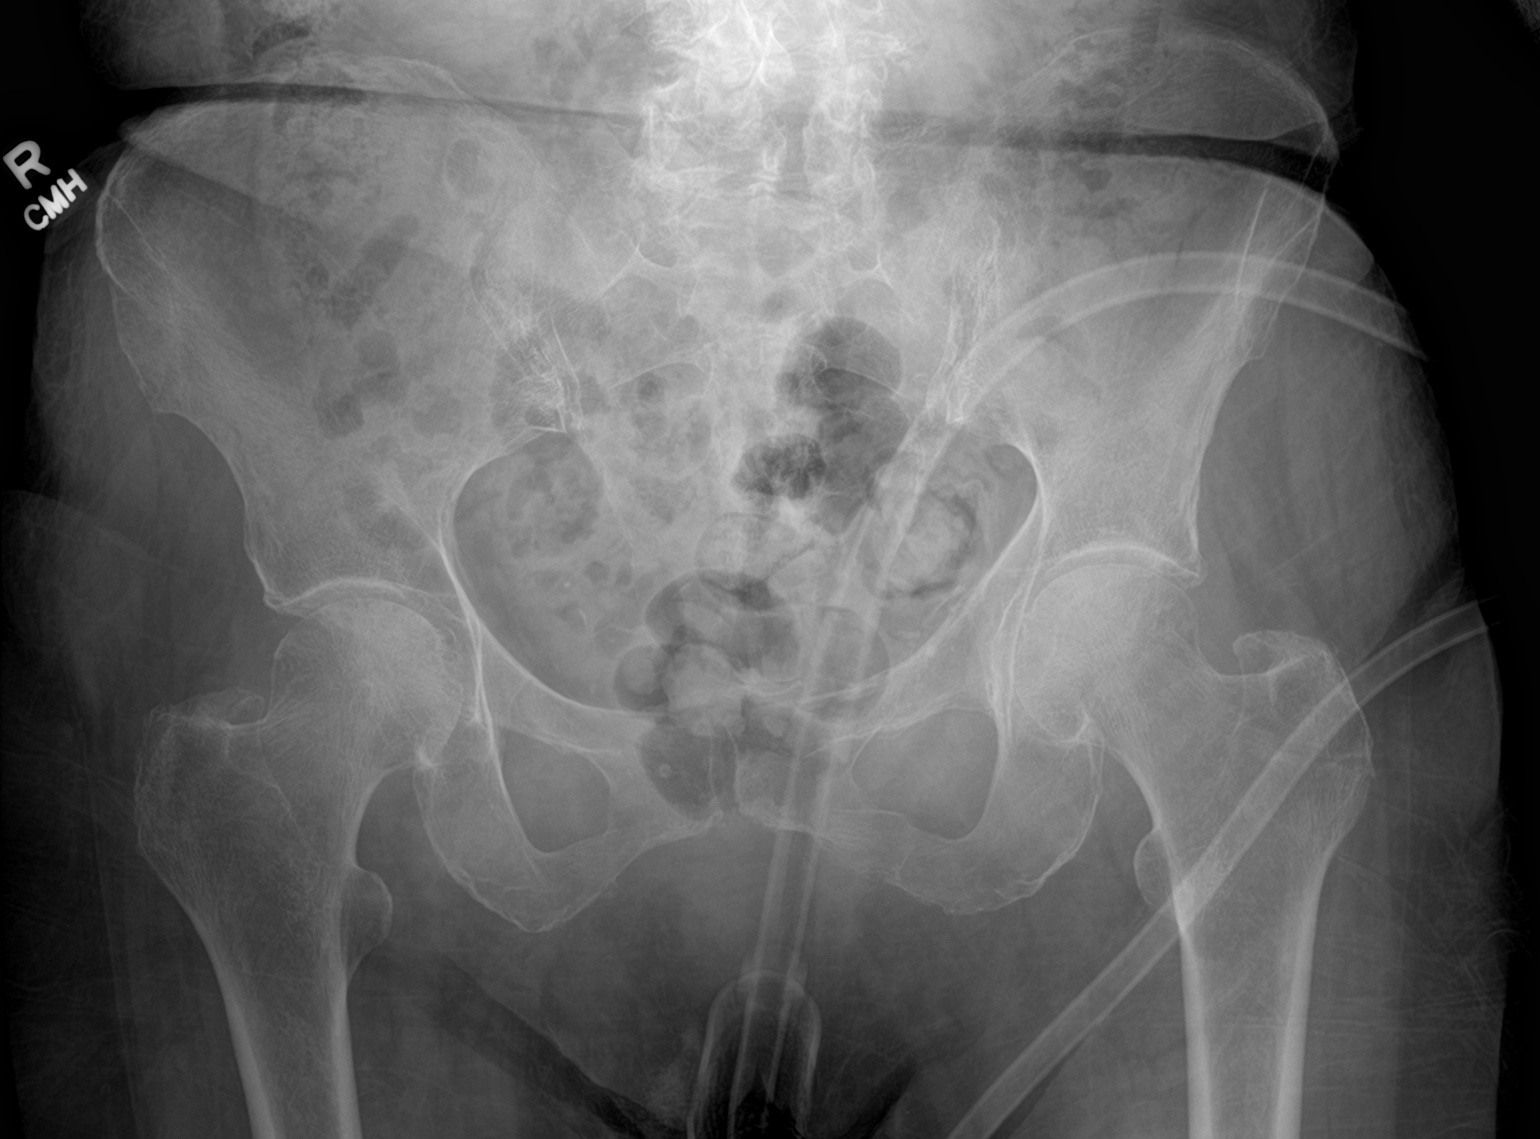

[1 of 1 positions shown; findings below may reference images not displayed]

FINDINGS: Bone mineralization is within normal limits for age. Femoral heads
are normally located. Grossly intact proximal femurs. No pelvis
fracture identified. SI joints appear symmetric. Evidence of
previous lower lumbar spine laminectomy/decompression. No acute
osseous abnormality identified. Retained stool but nonobstructed
visible bowel gas pattern.
IMPRESSION: No acute fracture or dislocation identified about the pelvis.

If there is lateralizing hip pain then dedicated hip series is
recommended.

## 2022-02-25 IMAGING — CT CT HEAD W/O CM
4 series · 15 of 47 positions shown, 17 images · non-contrast
Comparison: Head CT 09/20/2021.  Brain MRI 04/27/2013.

CLINICAL DATA: [AGE] female with multiple recent falls.
Posterior head laceration.

EXAM:
CT HEAD WITHOUT CONTRAST
TECHNIQUE: Contiguous axial images were obtained from the base of the skull
through the vertex without intravenous contrast.

[Series 2: head w o · axial · 0.46mm/px · z∈[+75,+175]mm · 7 of 28 slices shown, 9 images]
[im 4/28  brain]
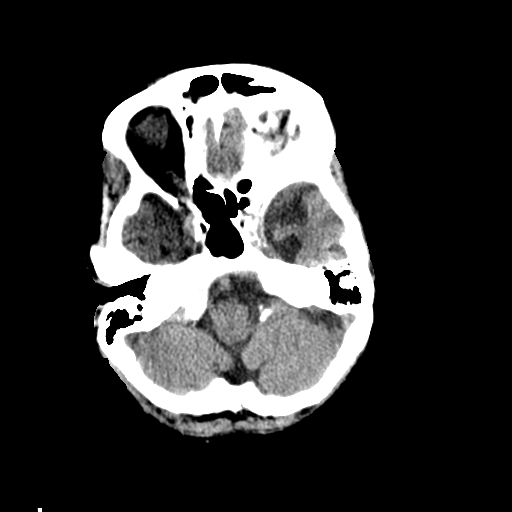
[im 4/28  bone]
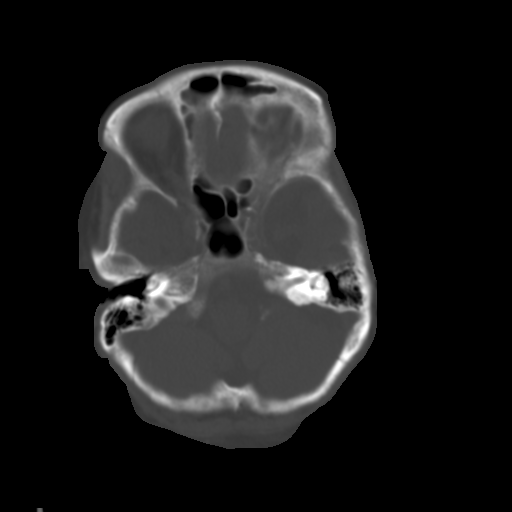
[im 7/28  brain]
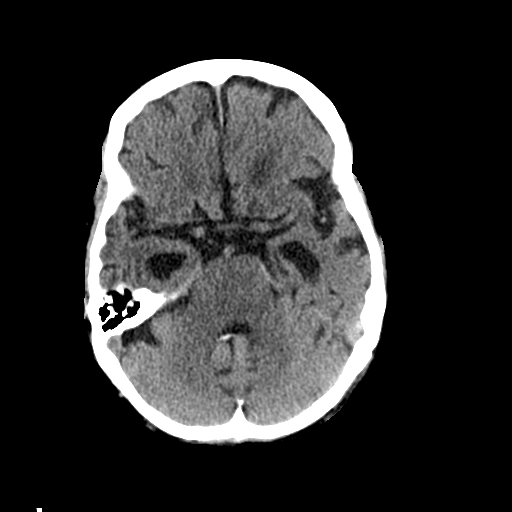
[im 11/28  brain]
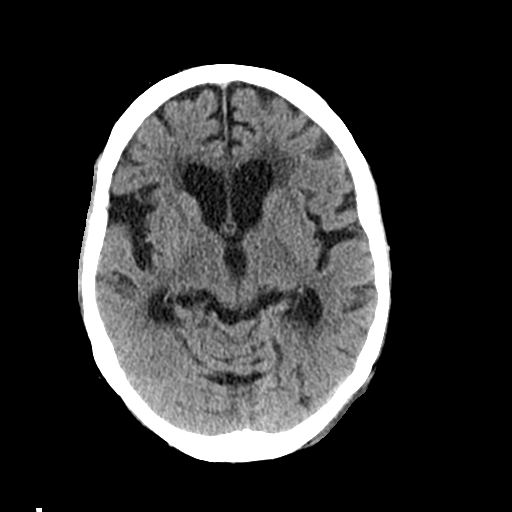
[im 14/28  brain]
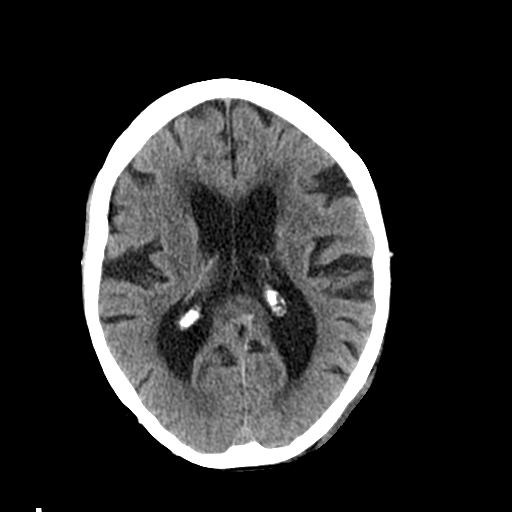
[im 17/28  brain]
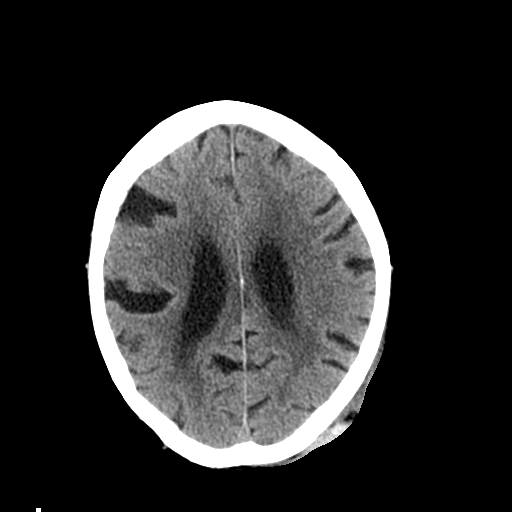
[im 17/28  bone]
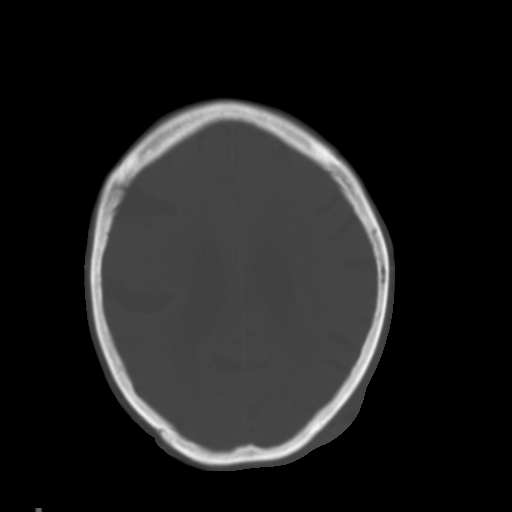
[im 21/28  brain]
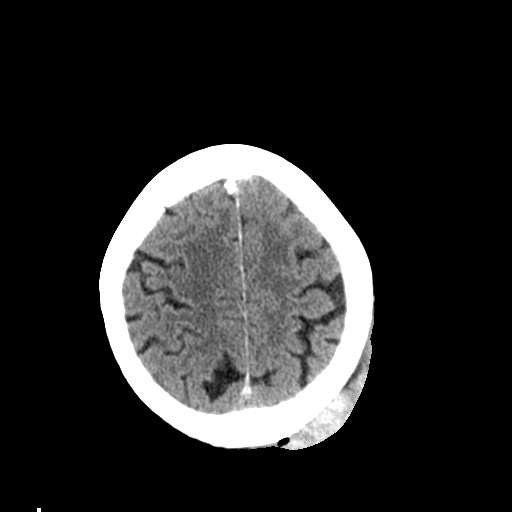
[im 24/28  brain]
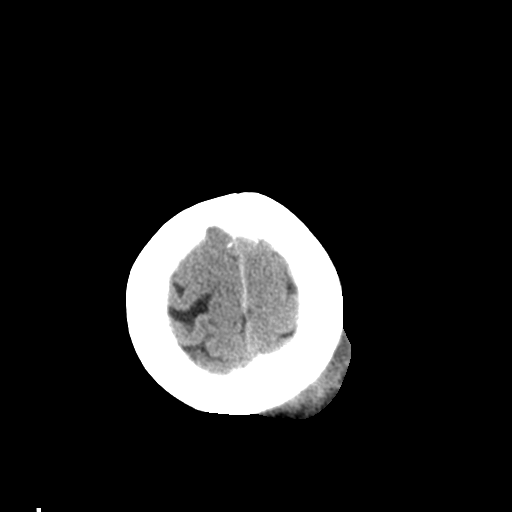

[Series 3: head bone · axial · 0.46mm/px · z∈[+72,+86]mm · 2 of 70 slices shown]
[im 7/70  bone]
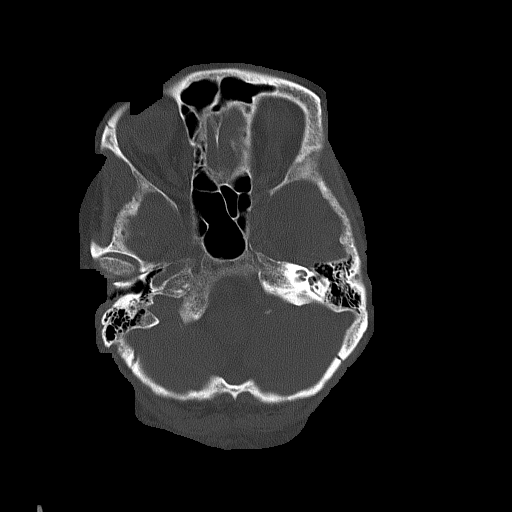
[im 14/70  bone]
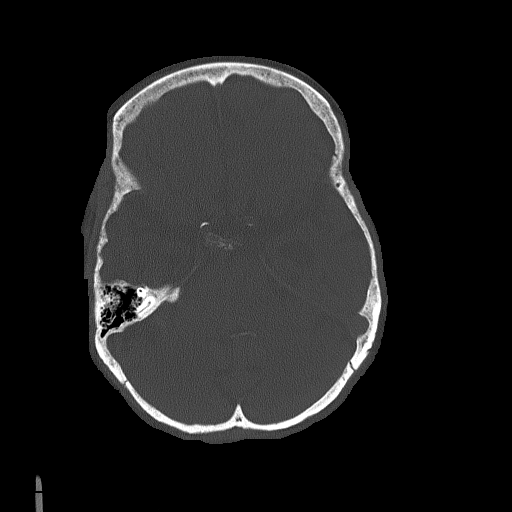

[Series 4: coronal soft · coronal · 0.27mm/px · 3 of 67 slices shown]
[im 23/67  brain]
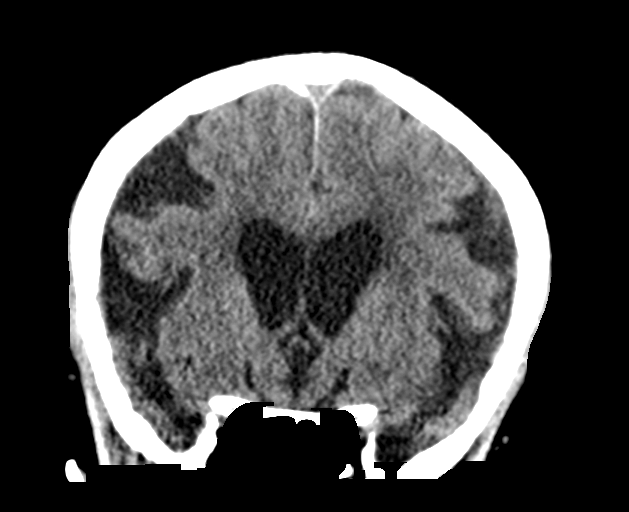
[im 30/67  brain]
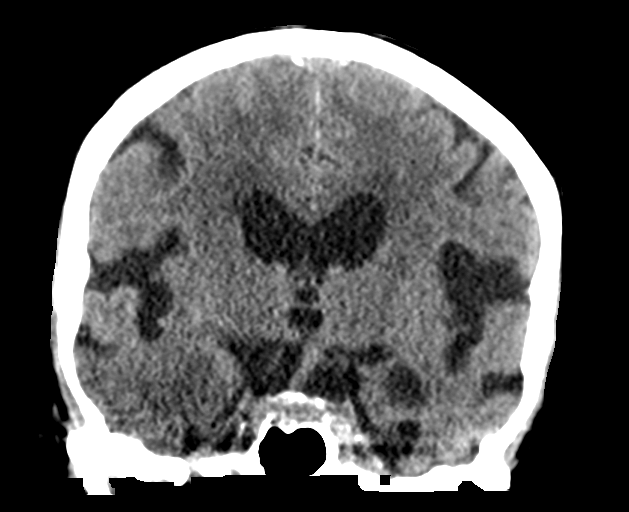
[im 37/67  brain]
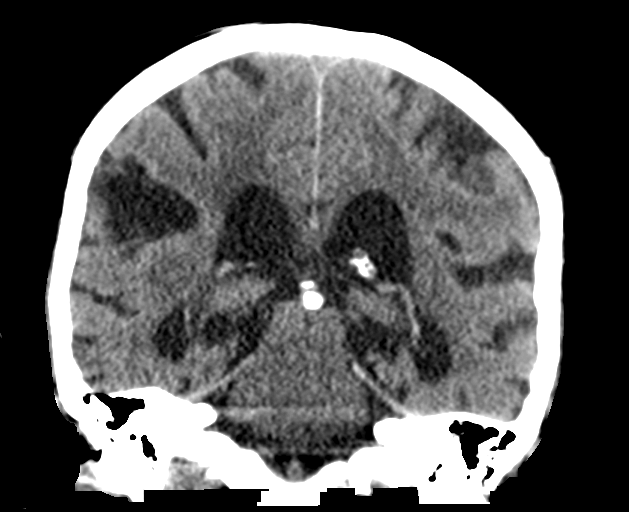

[Series 5: sagittal soft · sagittal · 0.27mm/px · 3 of 50 slices shown]
[im 17/50  brain]
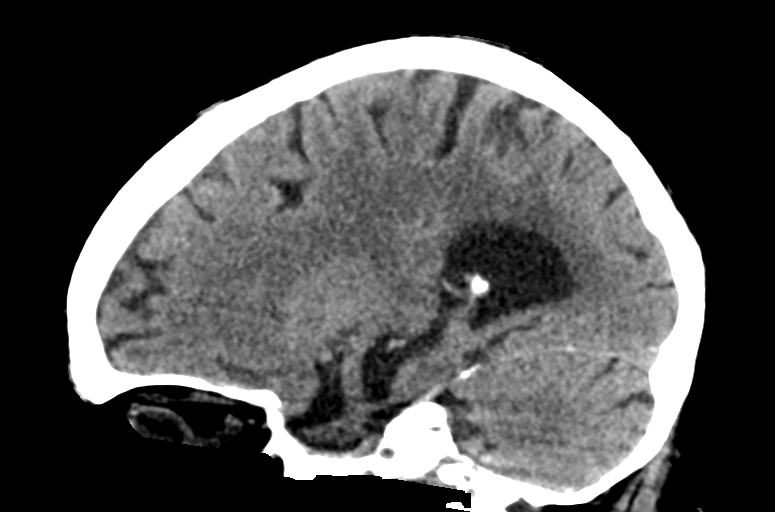
[im 25/50  brain]
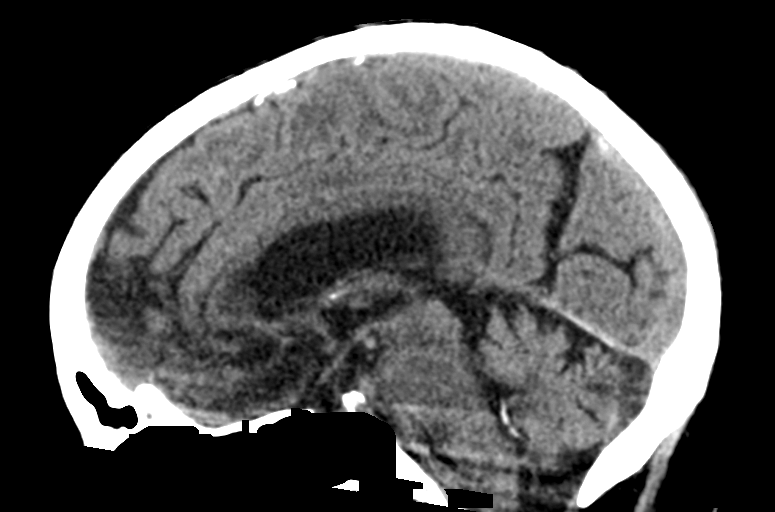
[im 33/50  brain]
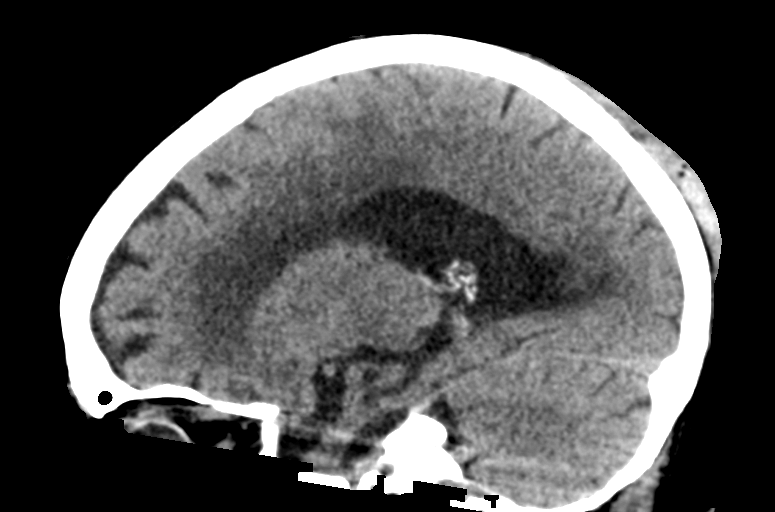

[15 of 47 positions shown; findings below may reference images not displayed]

FINDINGS: Brain: Stable cerebral volume. No midline shift, ventriculomegaly,
mass effect, evidence of mass lesion, intracranial hemorrhage or
evidence of cortically based acute infarction. Stable gray-white
matter differentiation throughout the brain.

Vascular: Calcified atherosclerosis at the skull base. No suspicious
intracranial vascular hyperdensity.

Skull: Stable and intact.  No acute osseous abnormality identified.

Sinuses/Orbits: Visualized paranasal sinuses and mastoids are stable
and well aerated.

Other: Left posterior convexity broad-based scalp hematoma with
superimposed laceration. Small volume of scalp soft tissue gas.
Hematoma measures up to 2 cm in thickness. But underlying calvarium
appears stable and intact.

Orbits appear stable.
IMPRESSION: 1. Large left posterior convexity scalp hematoma with laceration. No
underlying skull fracture.
2. Stable non contrast CT appearance of the brain.

## 2022-02-27 ENCOUNTER — Ambulatory Visit (INDEPENDENT_AMBULATORY_CARE_PROVIDER_SITE_OTHER): Payer: Medicare Other | Admitting: Family Medicine

## 2022-02-27 ENCOUNTER — Encounter: Payer: Self-pay | Admitting: Family Medicine

## 2022-02-27 VITALS — BP 112/71 | HR 77 | Ht 62.0 in | Wt 105.0 lb

## 2022-02-27 DIAGNOSIS — I10 Essential (primary) hypertension: Secondary | ICD-10-CM

## 2022-02-27 DIAGNOSIS — F03918 Unspecified dementia, unspecified severity, with other behavioral disturbance: Secondary | ICD-10-CM

## 2022-02-27 DIAGNOSIS — E782 Mixed hyperlipidemia: Secondary | ICD-10-CM | POA: Diagnosis not present

## 2022-02-27 DIAGNOSIS — R296 Repeated falls: Secondary | ICD-10-CM

## 2022-02-27 DIAGNOSIS — Z23 Encounter for immunization: Secondary | ICD-10-CM

## 2022-02-27 NOTE — Addendum Note (Signed)
Addended by: Dorene Sorrow on: 02/27/2022 11:43 AM ? ? Modules accepted: Orders ? ?

## 2022-02-27 NOTE — Progress Notes (Signed)
? ?BP 112/71   Pulse 77   Ht 5' 2"  (1.575 m)   Wt 105 lb (47.6 kg)   SpO2 97%   BMI 19.20 kg/m?   ? ?Subjective:  ? ?Patient ID: Melissa Hall, female    DOB: 08/20/1928, 88 y.o.   MRN: [id number] ? ?HPI: ?Melissa Hall is a 88 y.o. female presenting on 02/27/2022 for Medical Management of Chronic Issues and Hypertension ? ? ?HPI ?Hypertension ?Patient is currently on hydrochlorothiazide, and their blood pressure today is 112/71. Patient denies any lightheadedness or dizziness. Patient denies headaches, blurred vision, chest pains, shortness of breath, or weakness. Denies any side effects from medication and is content with current medication.  ? ?Hyperlipidemia ?Patient is coming in for recheck of his hyperlipidemia. The patient is currently taking pravastatin. They deny any issues with myalgias or history of liver damage from it. They deny any focal numbness or weakness or chest pain.  ? ?Patient's dementia with behavioral disturbance and has been stable.  Her daughter-in-law comes here with her as well as the history and says its not worsening significantly but about the same. ? ?She is having recurrent falls and they are having to catch her and be with her 24/7 now. ? ?Relevant past medical, surgical, family and social history reviewed and updated as indicated. Interim medical history since our last visit reviewed. ?Allergies and medications reviewed and updated. ? ?Review of Systems  ?Constitutional:  Negative for chills and fever.  ?Eyes:  Negative for visual disturbance.  ?Respiratory:  Negative for chest tightness and shortness of breath.   ?Cardiovascular:  Negative for chest pain and leg swelling.  ?Genitourinary:  Negative for difficulty urinating and dysuria.  ?Musculoskeletal:  Positive for gait problem. Negative for back pain.  ?Skin:  Negative for rash.  ?Neurological:  Positive for weakness. Negative for light-headedness and headaches.  ?Psychiatric/Behavioral:  Negative for agitation and  behavioral problems.   ?All other systems reviewed and are negative. ? ?Per HPI unless specifically indicated above ? ? ?Allergies as of 02/27/2022   ? ?   Reactions  ? Keflex [cephalexin] Rash  ? ?  ? ?  ?Medication List  ?  ? ?  ? Accurate as of February 27, 2022 11:08 AM. If you have any questions, ask your nurse or doctor.  ?  ?  ? ?  ? ?STOP taking these medications   ? ?diclofenac Sodium 1 % Gel ?Commonly known as: VOLTAREN ?Stopped by: Worthy Rancher, MD ?  ?sertraline 100 MG tablet ?Commonly known as: Zoloft ?Stopped by: Worthy Rancher, MD ?  ?sulfamethoxazole-trimethoprim 800-160 MG tablet ?Commonly known as: BACTRIM DS ?Stopped by: Worthy Rancher, MD ?  ? ?  ? ?TAKE these medications   ? ?ASPIRIN 81 PO ?aspirin 81 mg tablet,delayed release ?  81 mg by oral route. ?  ?hydrochlorothiazide 12.5 MG capsule ?Commonly known as: MICROZIDE ?Take 1 capsule (12.5 mg total) by mouth daily. ?  ?hydrOXYzine 25 MG capsule ?Commonly known as: VISTARIL ?Take 1 capsule (25 mg total) by mouth every 8 (eight) hours as needed. ?  ?pravastatin 10 MG tablet ?Commonly known as: PRAVACHOL ?Take 1 tablet (10 mg total) by mouth daily. ?  ? ?  ? ? ? ?Objective:  ? ?BP 112/71   Pulse 77   Ht 5' 2"  (1.575 m)   Wt 105 lb (47.6 kg)   SpO2 97%   BMI 19.20 kg/m?   ?Wt Readings from Last 3 Encounters:  ?02/27/22  105 lb (47.6 kg)  ?01/20/22 116 lb (52.6 kg)  ?11/29/21 120 lb (54.4 kg)  ?  ?Physical Exam ?Vitals and nursing note reviewed.  ?Constitutional:   ?   General: She is not in acute distress. ?   Appearance: She is well-developed. She is not diaphoretic.  ?Eyes:  ?   Conjunctiva/sclera: Conjunctivae normal.  ?   Pupils: Pupils are equal, round, and reactive to light.  ?Cardiovascular:  ?   Rate and Rhythm: Normal rate and regular rhythm.  ?   Heart sounds: Normal heart sounds. No murmur heard. ?Pulmonary:  ?   Effort: Pulmonary effort is normal. No respiratory distress.  ?   Breath sounds: Normal breath sounds. No  wheezing.  ?Musculoskeletal:     ?   General: No tenderness. Normal range of motion.  ?   Comments: Difficulty with sit to stand ?, Weakness in both legs  ?Skin: ?   General: Skin is warm and dry.  ?   Findings: No rash.  ?Neurological:  ?   Mental Status: She is alert and oriented to person, place, and time.  ?   Coordination: Coordination normal.  ?Psychiatric:     ?   Behavior: Behavior normal.  ? ? ? ? ?Assessment & Plan:  ? ?Problem List Items Addressed This Visit   ? ?  ? Cardiovascular and Mediastinum  ? Hypertension  ? Relevant Orders  ? CBC with Differential/Platelet  ?  ? Nervous and Auditory  ? Dementia with behavioral disturbance  ? Relevant Orders  ? CBC with Differential/Platelet  ?  ? Other  ? Hyperlipidemia - Primary  ? Relevant Orders  ? CBC with Differential/Platelet  ? CMP14+EGFR  ? Lipid panel  ? Frequent falls  ?Gave a list of balance training exercises for the elderly but I want her to do every day with her family and see if we can get her strength and balance up so she is not falling as frequently.  She is sitting in the chair very frequently currently. ? ?Follow up plan: ?Return in about 6 months (around 08/30/2022), or if symptoms worsen or fail to improve, for Hypertension and hyperlipidemia. ? ?Counseling provided for all of the vaccine components ?Orders Placed This Encounter  ?Procedures  ? CBC with Differential/Platelet  ? CMP14+EGFR  ? Lipid panel  ? ? ?Caryl Pina, MD ?[place] ?02/27/2022, 11:08 AM ? ? ? ? ?

## 2022-02-28 LAB — LIPID PANEL
Chol/HDL Ratio: 4 ratio (ref 0.0–4.4)
Cholesterol, Total: 186 mg/dL (ref 100–199)
HDL: 47 mg/dL (ref >39)
LDL Chol Calc (NIH): 120 mg/dL — ABNORMAL HIGH (ref 0–99)
Triglycerides: 106 mg/dL (ref 0–149)
VLDL Cholesterol Cal: 19 mg/dL (ref 5–40)

## 2022-02-28 LAB — CBC WITH DIFFERENTIAL/PLATELET
Basophils Absolute: 0 10*3/uL (ref 0.0–0.2)
Basos: 0 %
EOS (ABSOLUTE): 0 10*3/uL (ref 0.0–0.4)
Eos: 1 %
Hematocrit: 39.8 % (ref 34.0–46.6)
Hemoglobin: 13.3 g/dL (ref 11.1–15.9)
Immature Grans (Abs): 0 10*3/uL (ref 0.0–0.1)
Immature Granulocytes: 0 %
Lymphocytes Absolute: 1.1 10*3/uL (ref 0.7–3.1)
Lymphs: 17 %
MCH: 30.6 pg (ref 26.6–33.0)
MCHC: 33.4 g/dL (ref 31.5–35.7)
MCV: 92 fL (ref 79–97)
Monocytes Absolute: 0.6 10*3/uL (ref 0.1–0.9)
Monocytes: 10 %
Neutrophils Absolute: 4.5 10*3/uL (ref 1.4–7.0)
Neutrophils: 72 %
Platelets: 212 10*3/uL (ref 150–450)
RBC: 4.34 x10E6/uL (ref 3.77–5.28)
RDW: 13.3 % (ref 11.7–15.4)
WBC: 6.2 10*3/uL (ref 3.4–10.8)

## 2022-02-28 LAB — CMP14+EGFR
ALT: 9 IU/L (ref 0–32)
AST: 23 IU/L (ref 0–40)
Albumin/Globulin Ratio: 1.7 (ref 1.2–2.2)
Albumin: 3.8 g/dL (ref 3.5–4.6)
Alkaline Phosphatase: 82 IU/L (ref 44–121)
BUN/Creatinine Ratio: 17 (ref 12–28)
BUN: 15 mg/dL (ref 10–36)
Bilirubin Total: 0.4 mg/dL (ref 0.0–1.2)
CO2: 34 mmol/L — ABNORMAL HIGH (ref 20–29)
Calcium: 9.6 mg/dL (ref 8.7–10.3)
Chloride: 103 mmol/L (ref 96–106)
Creatinine, Ser: 0.87 mg/dL (ref 0.57–1.00)
Globulin, Total: 2.2 g/dL (ref 1.5–4.5)
Glucose: 128 mg/dL — ABNORMAL HIGH (ref 70–99)
Potassium: 3.7 mmol/L (ref 3.5–5.2)
Sodium: 146 mmol/L — ABNORMAL HIGH (ref 134–144)
Total Protein: 6 g/dL (ref 6.0–8.5)
eGFR: 63 mL/min/{1.73_m2} (ref >59)

## 2022-06-18 ENCOUNTER — Other Ambulatory Visit: Payer: Self-pay | Admitting: Family Medicine

## 2022-06-18 DIAGNOSIS — E782 Mixed hyperlipidemia: Secondary | ICD-10-CM

## 2022-06-28 ENCOUNTER — Other Ambulatory Visit: Payer: Self-pay

## 2022-06-28 ENCOUNTER — Encounter (HOSPITAL_COMMUNITY): Payer: Self-pay | Admitting: *Deleted

## 2022-06-28 ENCOUNTER — Emergency Department (HOSPITAL_COMMUNITY)
Admission: EM | Admit: 2022-06-28 | Discharge: 2022-06-29 | Disposition: A | Discharge status: Home / Self Care | Payer: Medicare Other | Attending: Emergency Medicine | Admitting: Emergency Medicine

## 2022-06-28 DIAGNOSIS — Z79899 Other long term (current) drug therapy: Secondary | ICD-10-CM | POA: Diagnosis not present

## 2022-06-28 DIAGNOSIS — Z7982 Long term (current) use of aspirin: Secondary | ICD-10-CM | POA: Diagnosis not present

## 2022-06-28 DIAGNOSIS — W268XXA Contact with other sharp object(s), not elsewhere classified, initial encounter: Secondary | ICD-10-CM | POA: Diagnosis not present

## 2022-06-28 DIAGNOSIS — S81812A Laceration without foreign body, left lower leg, initial encounter: Secondary | ICD-10-CM | POA: Diagnosis not present

## 2022-06-28 DIAGNOSIS — S8992XA Unspecified injury of left lower leg, initial encounter: Secondary | ICD-10-CM | POA: Diagnosis present

## 2022-06-28 HISTORY — DX: Unspecified dementia, unspecified severity, without behavioral disturbance, psychotic disturbance, mood disturbance, and anxiety: F03.90

## 2022-06-28 MED ORDER — LIDOCAINE HCL (PF) 1 % IJ SOLN
30.0000 mL | Freq: Once | INTRAMUSCULAR | Status: AC
  Administered 2022-06-29: 30 mL
  Filled 2022-06-28: qty 30

## 2022-06-28 NOTE — ED Provider Notes (Signed)
[facility] EMERGENCY DEPARTMENT Provider Note   CSN: [id number] Arrival date & time: 06/28/22  2004     History {Add pertinent medical, surgical, social history, OB history to HPI:1} Chief Complaint  Patient presents with   Laceration    Melissa Hall is a 88 y.o. female.  Patient presents to the ER for evaluation of laceration to left shin.  Patient had a near fall earlier today where she was walking with a walker.  She likely got tangled up in the walker and suffered a laceration to the shin.  She did not fall to the ground.       Home Medications Prior to Admission medications   Medication Sig Start Date End Date Taking? Authorizing Provider  ASPIRIN 81 PO aspirin 81 mg tablet,delayed release   81 mg by oral route.    [provider]  hydrochlorothiazide (MICROZIDE) 12.5 MG capsule Take 1 capsule (12.5 mg total) by mouth daily. 07/04/21   Dettinger, Elige Radon, MD  hydrOXYzine (VISTARIL) 25 MG capsule Take 1 capsule (25 mg total) by mouth every 8 (eight) hours as needed. 01/20/22   Dettinger, Elige Radon, MD  pravastatin (PRAVACHOL) 10 MG tablet TAKE 1 TABLET DAILY 06/18/22   Dettinger, Elige Radon, MD      Allergies    Keflex [cephalexin]    Review of Systems   Review of Systems  Physical Exam Updated Vital Signs BP 125/79   Pulse 84   Temp (!) 97.5 F (36.4 C) (Temporal)   Resp 15   Ht 4\' 11"  (1.499 m)   Wt 47.6 kg   SpO2 97%   BMI 21.20 kg/m  Physical Exam Vitals and nursing note reviewed.  Constitutional:      General: She is not in acute distress.    Appearance: She is well-developed.  HENT:     Head: Normocephalic and atraumatic.     Mouth/Throat:     Mouth: Mucous membranes are moist.  Eyes:     General: Vision grossly intact. Gaze aligned appropriately.     Extraocular Movements: Extraocular movements intact.     Conjunctiva/sclera: Conjunctivae normal.  Cardiovascular:     Rate and Rhythm: Normal rate and regular rhythm.     Pulses: Normal  pulses.     Heart sounds: Normal heart sounds, S1 normal and S2 normal. No murmur heard.    No friction rub. No gallop.  Pulmonary:     Effort: Pulmonary effort is normal. No respiratory distress.     Breath sounds: Normal breath sounds.  Abdominal:     General: Bowel sounds are normal.     Palpations: Abdomen is soft.     Tenderness: There is no abdominal tenderness. There is no guarding or rebound.     Hernia: No hernia is present.  Musculoskeletal:        General: No swelling.     Cervical back: Full passive range of motion without pain, normal range of motion and neck supple. No spinous process tenderness or muscular tenderness. Normal range of motion.     Right lower leg: No edema.     Left lower leg: Laceration present. No edema.  Skin:    General: Skin is warm and dry.     Capillary Refill: Capillary refill takes less than 2 seconds.     Findings: Laceration present. No ecchymosis, erythema, rash or wound.  Neurological:     General: No focal deficit present.     Mental Status: She is alert and  oriented to person, place, and time.     GCS: GCS eye subscore is 4. GCS verbal subscore is 5. GCS motor subscore is 6.     Cranial Nerves: Cranial nerves 2-12 are intact.     Sensory: Sensation is intact.     Motor: Motor function is intact.     Coordination: Coordination is intact.  Psychiatric:        Attention and Perception: Attention normal.        Mood and Affect: Mood normal.        Speech: Speech normal.        Behavior: Behavior normal.     ED Results / Procedures / Treatments   Labs (all labs ordered are listed, but only abnormal results are displayed) Labs Reviewed - No data to display  EKG None  Radiology No results found.  Procedures Procedures  {Document cardiac monitor, telemetry assessment procedure when appropriate:1}  Medications Ordered in ED Medications - No data to display  ED Course/ Medical Decision Making/ A&P                            Medical Decision Making  ***  {Document critical care time when appropriate:1} {Document review of labs and clinical decision tools ie heart score, Chads2Vasc2 etc:1}  {Document your independent review of radiology images, and any outside records:1} {Document your discussion with family members, caretakers, and with consultants:1} {Document social determinants of health affecting pt's care:1} {Document your decision making why or why not admission, treatments were needed:1} Final Clinical Impression(s) / ED Diagnoses Final diagnoses:  None    Rx / DC Orders ED Discharge Orders     None

## 2022-06-28 NOTE — ED Triage Notes (Signed)
Pt had a fall earlier today, lac to left lower leg.  Unknown how she fell, pt with dementia.

## 2022-06-29 MED ORDER — DOXYCYCLINE HYCLATE 100 MG PO CAPS
100.0000 mg | ORAL_CAPSULE | Freq: Two times a day (BID) | ORAL | 0 refills | Status: DC
Start: 2022-06-29 — End: 2022-07-09

## 2022-07-07 ENCOUNTER — Ambulatory Visit: Payer: Self-pay | Admitting: *Deleted

## 2022-07-07 NOTE — Chronic Care Management (AMB) (Signed)
  Chronic Care Management   Note  07/07/2022 Name: COLLEENA KURTENBACH MRN: [id number] DOB: [birth year]   Patient has either met RN Care Management goals, is stable from [facility] Management perspective, or has not recently engaged with the RN Care Manager. I am removing RN Care Manager from Care Team and closing [facility]. If patient is currently engaged with another CCM team member I will forward this encounter to inform them of my case closure. Patient may be eligible for re-engagement with RN Care Manager in the future if necessary and can discuss this with their PCP.  Chong Sicilian, BSN, RN-BC Embedded Chronic Care Manager [facility] / [place] [facility] Direct Dial: [phone]

## 2022-07-07 NOTE — Patient Instructions (Signed)
Melissa Hall  I have previously worked with you through the Chronic Care Management Program at [facility]. Due to program changes I am removing myself from your care team because you've either met our goals, your conditions are stable and no longer require care management, or we haven't engaged within the past 6 months. If you are currently active with another CCM Team Member, you will remain active with them unless they reach out to you with additional information. If you feel that you need RN Care Management services in the future, please talk with your primary care provider to discuss re-engagement with the RN Care Manager that will be assigned to [facility]. This does not affect your status as a patient at [facility].   Thank you for allowing me to participate in your your healthcare journey.  Chong Sicilian, BSN, RN-BC Embedded Chronic Care Manager [facility] / [place] [facility] Direct Dial: [phone]

## 2022-07-09 ENCOUNTER — Ambulatory Visit (INDEPENDENT_AMBULATORY_CARE_PROVIDER_SITE_OTHER): Payer: Medicare Other | Admitting: Nurse Practitioner

## 2022-07-09 ENCOUNTER — Encounter: Payer: Self-pay | Admitting: Nurse Practitioner

## 2022-07-09 VITALS — BP 122/74 | HR 79 | Temp 97.9°F | Ht 59.0 in | Wt 108.8 lb

## 2022-07-09 DIAGNOSIS — S81802A Unspecified open wound, left lower leg, initial encounter: Secondary | ICD-10-CM

## 2022-07-09 NOTE — Progress Notes (Signed)
   Acute Office Visit  Subjective:     Patient ID: Melissa Hall, female    DOB: 1928/05/05, 88 y.o.   MRN: [id number]  Chief Complaint  Patient presents with   ER Follow up    06/28/22 AP- left leg laceration - suture removal     HPI Patient is in today for for stitch removal from a skin tear on her left leg lower shin.  Skin tear is gradually healing but not completely resolved. Patient's skin is red, with bright red drainage.  Wound is not completely healed.  Review of Systems  Constitutional: Negative.   HENT: Negative.    Respiratory: Negative.    Cardiovascular: Negative.   Genitourinary: Negative.   Skin: Negative.  Negative for rash.       Wound on left shin  All other systems reviewed and are negative.       Objective:    BP 122/74   Pulse 79   Temp 97.9 F (36.6 C) (Temporal)   Ht 4\' 11"  (1.499 m)   Wt 108 lb 12.8 oz (49.4 kg)   SpO2 95%   BMI 21.97 kg/m  BP Readings from Last 3 Encounters:  07/09/22 122/74  06/29/22 (!) 141/75  02/27/22 112/71   Wt Readings from Last 3 Encounters:  07/09/22 108 lb 12.8 oz (49.4 kg)  06/28/22 104 lb 15 oz (47.6 kg)  02/27/22 105 lb (47.6 kg)      Physical Exam Vitals and nursing note reviewed.  Constitutional:      Appearance: Normal appearance.  HENT:     Head: Normocephalic.     Nose: Nose normal.  Eyes:     Conjunctiva/sclera: Conjunctivae normal.  Cardiovascular:     Rate and Rhythm: Normal rate.     Pulses: Normal pulses.     Heart sounds: Normal heart sounds.  Abdominal:     General: Bowel sounds are normal.  Neurological:     General: No focal deficit present.     Mental Status: She is alert and oriented to person, place, and time.  Psychiatric:        Behavior: Behavior normal.     No results found for any visits on 07/09/22.      Assessment & Plan:  I provided education to patient and caregiver on watching out for signs and symptoms of infection.  7 stitches removed after cleaning with  Betadine.  Steri-Strips placed and pressure applied with nonadhesive gauze.  Advised patient to take off in 3 to 4 days or when it falls out. Problem List Items Addressed This Visit   None   No orders of the defined types were placed in this encounter.   Return if symptoms worsen or fail to improve.  07/11/22, NP

## 2022-07-09 NOTE — Patient Instructions (Signed)

## 2022-08-11 ENCOUNTER — Other Ambulatory Visit: Payer: Self-pay | Admitting: Family Medicine

## 2022-08-11 DIAGNOSIS — I10 Essential (primary) hypertension: Secondary | ICD-10-CM

## 2022-09-03 ENCOUNTER — Encounter: Payer: Self-pay | Admitting: Family Medicine

## 2022-09-03 ENCOUNTER — Ambulatory Visit (INDEPENDENT_AMBULATORY_CARE_PROVIDER_SITE_OTHER): Payer: Medicare Other | Admitting: Family Medicine

## 2022-09-03 VITALS — BP 122/67 | HR 69 | Temp 97.2°F | Ht 59.0 in | Wt 115.0 lb

## 2022-09-03 DIAGNOSIS — F03918 Unspecified dementia, unspecified severity, with other behavioral disturbance: Secondary | ICD-10-CM | POA: Diagnosis not present

## 2022-09-03 DIAGNOSIS — I10 Essential (primary) hypertension: Secondary | ICD-10-CM

## 2022-09-03 DIAGNOSIS — G301 Alzheimer's disease with late onset: Secondary | ICD-10-CM | POA: Diagnosis not present

## 2022-09-03 DIAGNOSIS — F02818 Dementia in other diseases classified elsewhere, unspecified severity, with other behavioral disturbance: Secondary | ICD-10-CM | POA: Diagnosis not present

## 2022-09-03 DIAGNOSIS — Z23 Encounter for immunization: Secondary | ICD-10-CM

## 2022-09-03 DIAGNOSIS — E782 Mixed hyperlipidemia: Secondary | ICD-10-CM

## 2022-09-03 MED ORDER — PRAVASTATIN SODIUM 10 MG PO TABS: 10.0000 mg | ORAL_TABLET | Freq: Every day | ORAL | 3 refills | Status: DC

## 2022-09-03 MED ORDER — HYDROCHLOROTHIAZIDE 12.5 MG PO CAPS: 12.5000 mg | ORAL_CAPSULE | Freq: Every day | ORAL | 3 refills | Status: AC

## 2022-09-03 MED ORDER — HYDROXYZINE PAMOATE 25 MG PO CAPS: 25.0000 mg | ORAL_CAPSULE | Freq: Three times a day (TID) | ORAL | 3 refills | Status: AC | PRN

## 2022-09-03 MED ORDER — ARIPIPRAZOLE 5 MG PO TABS: 5.0000 mg | ORAL_TABLET | Freq: Every day | ORAL | 1 refills | Status: AC

## 2022-09-03 NOTE — Progress Notes (Signed)
BP 122/67   Pulse 69   Temp (!) 97.2 F (36.2 C)   Ht 4' 11"  (1.499 m)   Wt 115 lb (52.2 kg)   SpO2 97%   BMI 23.23 kg/m    Subjective:   Patient ID: Melissa Hall, female    DOB: [birth year], 88 y.o.   MRN: [id number]  HPI: Melissa Hall is a 88 y.o. female presenting on 09/03/2022 for Medical Management of Chronic Issues, Hyperlipidemia, and Hypertension   HPI Hypertension Patient is currently on hydrochlorothiazide, and their blood pressure today is 122/67. Patient denies any lightheadedness or dizziness. Patient denies headaches, blurred vision, chest pains, shortness of breath, or weakness. Denies any side effects from medication and is content with current medication.   Hyperlipidemia Patient is coming in for recheck of his hyperlipidemia. The patient is currently taking pravastatin. They deny any issues with myalgias or history of liver damage from it. They deny any focal numbness or weakness or chest pain.   Dementia with behavioral disturbance Patient is coming in for dementia with behavioral disturbance.  The memory is worsening and she is having outbursts that are sometimes lead to biting and aggressiveness.  Relevant past medical, surgical, family and social history reviewed and updated as indicated. Interim medical history since our last visit reviewed. Allergies and medications reviewed and updated.  Review of Systems  Constitutional:  Negative for chills and fever.  Eyes:  Negative for visual disturbance.  Respiratory:  Negative for chest tightness and shortness of breath.   Cardiovascular:  Negative for chest pain and leg swelling.  Musculoskeletal:  Negative for back pain and gait problem.  Skin:  Negative for rash.  Neurological:  Negative for dizziness, light-headedness and headaches.  Psychiatric/Behavioral:  Positive for agitation, behavioral problems and confusion.   All other systems reviewed and are negative.   Per HPI unless specifically  indicated above   Allergies as of 09/03/2022       Reactions   Keflex [cephalexin] Rash        Medication List        Accurate as of September 03, 2022 11:29 AM. If you have any questions, ask your nurse or doctor.          ARIPiprazole 5 MG tablet Commonly known as: Abilify Take 1 tablet (5 mg total) by mouth daily. Started by: Fransisca Kaufmann Cato Liburd, MD   ASPIRIN 81 PO aspirin 81 mg tablet,delayed release   81 mg by oral route.   hydrochlorothiazide 12.5 MG capsule Commonly known as: MICROZIDE Take 1 capsule (12.5 mg total) by mouth daily.   hydrOXYzine 25 MG capsule Commonly known as: VISTARIL Take 1 capsule (25 mg total) by mouth every 8 (eight) hours as needed.   pravastatin 10 MG tablet Commonly known as: PRAVACHOL Take 1 tablet (10 mg total) by mouth daily.   sertraline 100 MG tablet Commonly known as: ZOLOFT Take 100 mg by mouth daily.         Objective:   BP 122/67   Pulse 69   Temp (!) 97.2 F (36.2 C)   Ht 4' 11"  (1.499 m)   Wt 115 lb (52.2 kg)   SpO2 97%   BMI 23.23 kg/m   Wt Readings from Last 3 Encounters:  09/03/22 115 lb (52.2 kg)  07/09/22 108 lb 12.8 oz (49.4 kg)  06/28/22 104 lb 15 oz (47.6 kg)    Physical Exam Vitals and nursing note reviewed.  Constitutional:      General:  She is not in acute distress.    Appearance: She is well-developed. She is not diaphoretic.  Eyes:     Conjunctiva/sclera: Conjunctivae normal.     Pupils: Pupils are equal, round, and reactive to light.  Cardiovascular:     Rate and Rhythm: Normal rate and regular rhythm.     Heart sounds: Normal heart sounds. No murmur heard. Pulmonary:     Effort: Pulmonary effort is normal. No respiratory distress.     Breath sounds: Normal breath sounds. No wheezing.  Musculoskeletal:        General: No tenderness. Normal range of motion.  Skin:    General: Skin is warm and dry.     Findings: No rash.  Neurological:     Mental Status: She is alert and  oriented to person, place, and time.     Coordination: Coordination normal.  Psychiatric:        Mood and Affect: Mood is anxious and depressed.        Behavior: Behavior normal.        Thought Content: Thought content does not include suicidal ideation. Thought content does not include suicidal plan.        Cognition and Memory: Memory is impaired.       Assessment & Plan:   Problem List Items Addressed This Visit       Cardiovascular and Mediastinum   Hypertension   Relevant Medications   hydrochlorothiazide (MICROZIDE) 12.5 MG capsule   pravastatin (PRAVACHOL) 10 MG tablet   Other Relevant Orders   CBC with Differential/Platelet   CMP14+EGFR     Nervous and Auditory   Dementia with behavioral disturbance   Relevant Medications   hydrOXYzine (VISTARIL) 25 MG capsule   sertraline (ZOLOFT) 100 MG tablet   ARIPiprazole (ABILIFY) 5 MG tablet     Other   Hyperlipidemia   Relevant Medications   hydrochlorothiazide (MICROZIDE) 12.5 MG capsule   pravastatin (PRAVACHOL) 10 MG tablet   Other Relevant Orders   Lipid panel   Other Visit Diagnoses     Essential hypertension    -  Primary   Relevant Medications   hydrochlorothiazide (MICROZIDE) 12.5 MG capsule   pravastatin (PRAVACHOL) 10 MG tablet   Other Relevant Orders   CBC with Differential/Platelet   CMP14+EGFR   Lipid panel   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  We will check blood work today.  Family is starting to discuss the process for [place] 2 because of worsening memory but they do have a caretaker that comes that is helping. Follow up plan: Return in about 6 months (around 03/04/2023), or if symptoms worsen or fail to improve, for Hypertension cholesterol  and Alzheimer's.  Counseling provided for all of the vaccine components Orders Placed This Encounter  Procedures   CBC with Differential/Platelet   CMP14+EGFR   Lipid panel    Caryl Pina, MD [facility] 09/03/2022, 11:29 AM

## 2022-09-04 LAB — CMP14+EGFR
ALT: 9 IU/L (ref 0–32)
AST: 17 IU/L (ref 0–40)
Albumin/Globulin Ratio: 1.8 (ref 1.2–2.2)
Albumin: 4.1 g/dL (ref 3.6–4.6)
Alkaline Phosphatase: 82 IU/L (ref 44–121)
BUN/Creatinine Ratio: 13 (ref 12–28)
BUN: 9 mg/dL — ABNORMAL LOW (ref 10–36)
Bilirubin Total: 0.5 mg/dL (ref 0.0–1.2)
CO2: 27 mmol/L (ref 20–29)
Calcium: 9.3 mg/dL (ref 8.7–10.3)
Chloride: 103 mmol/L (ref 96–106)
Creatinine, Ser: 0.72 mg/dL (ref 0.57–1.00)
Globulin, Total: 2.3 g/dL (ref 1.5–4.5)
Glucose: 116 mg/dL — ABNORMAL HIGH (ref 70–99)
Potassium: 3.8 mmol/L (ref 3.5–5.2)
Sodium: 140 mmol/L (ref 134–144)
Total Protein: 6.4 g/dL (ref 6.0–8.5)
eGFR: 79 mL/min/{1.73_m2} (ref >59)

## 2022-09-04 LAB — CBC WITH DIFFERENTIAL/PLATELET
Basophils Absolute: 0 10*3/uL (ref 0.0–0.2)
Basos: 0 %
EOS (ABSOLUTE): 0 10*3/uL (ref 0.0–0.4)
Eos: 0 %
Hematocrit: 39.5 % (ref 34.0–46.6)
Hemoglobin: 13.2 g/dL (ref 11.1–15.9)
Immature Grans (Abs): 0 10*3/uL (ref 0.0–0.1)
Immature Granulocytes: 0 %
Lymphocytes Absolute: 0.9 10*3/uL (ref 0.7–3.1)
Lymphs: 18 %
MCH: 31.4 pg (ref 26.6–33.0)
MCHC: 33.4 g/dL (ref 31.5–35.7)
MCV: 94 fL (ref 79–97)
Monocytes Absolute: 0.4 10*3/uL (ref 0.1–0.9)
Monocytes: 9 %
Neutrophils Absolute: 3.6 10*3/uL (ref 1.4–7.0)
Neutrophils: 73 %
Platelets: 185 10*3/uL (ref 150–450)
RBC: 4.2 x10E6/uL (ref 3.77–5.28)
RDW: 12.5 % (ref 11.7–15.4)
WBC: 5 10*3/uL (ref 3.4–10.8)

## 2022-09-04 LAB — LIPID PANEL
Chol/HDL Ratio: 3.5 ratio (ref 0.0–4.4)
Cholesterol, Total: 197 mg/dL (ref 100–199)
HDL: 56 mg/dL (ref >39)
LDL Chol Calc (NIH): 127 mg/dL — ABNORMAL HIGH (ref 0–99)
Triglycerides: 75 mg/dL (ref 0–149)
VLDL Cholesterol Cal: 14 mg/dL (ref 5–40)

## 2022-09-11 ENCOUNTER — Other Ambulatory Visit: Payer: Self-pay

## 2022-09-11 MED ORDER — PRAVASTATIN SODIUM 20 MG PO TABS: 20.0000 mg | ORAL_TABLET | Freq: Every evening | ORAL | 3 refills | Status: AC

## 2022-11-10 DIAGNOSIS — H16012 Central corneal ulcer, left eye: Secondary | ICD-10-CM | POA: Diagnosis not present

## 2022-11-10 DIAGNOSIS — H20032 Secondary infectious iridocyclitis, left eye: Secondary | ICD-10-CM | POA: Diagnosis not present

## 2022-11-11 DIAGNOSIS — H16002 Unspecified corneal ulcer, left eye: Secondary | ICD-10-CM | POA: Diagnosis not present

## 2022-11-11 DIAGNOSIS — H16143 Punctate keratitis, bilateral: Secondary | ICD-10-CM | POA: Diagnosis not present

## 2022-11-11 DIAGNOSIS — Z961 Presence of intraocular lens: Secondary | ICD-10-CM | POA: Diagnosis not present

## 2022-11-21 DEATH — deceased
